# Patient Record
Sex: Female | Born: 1960 | Race: Black or African American | Hispanic: No | Marital: Married | Smoking: Former smoker
Health system: Southern US, Community
[De-identification: ages and names within clinical notes are randomized; demographics above are authoritative.]

## PROBLEM LIST (undated history)

## (undated) DIAGNOSIS — I10 Essential (primary) hypertension: Secondary | ICD-10-CM

## (undated) DIAGNOSIS — Z803 Family history of malignant neoplasm of breast: Secondary | ICD-10-CM

## (undated) DIAGNOSIS — C801 Malignant (primary) neoplasm, unspecified: Secondary | ICD-10-CM

## (undated) DIAGNOSIS — C50919 Malignant neoplasm of unspecified site of unspecified female breast: Secondary | ICD-10-CM

## (undated) HISTORY — DX: Family history of malignant neoplasm of breast: Z80.3

## (undated) HISTORY — DX: Essential (primary) hypertension: I10

## (undated) HISTORY — PX: TUBAL LIGATION: SHX77

---

## 1998-02-27 ENCOUNTER — Inpatient Hospital Stay (HOSPITAL_COMMUNITY): Admission: AD | Admit: 1998-02-27 | Discharge: 1998-02-27 | Payer: Self-pay | Admitting: Gynecology

## 1998-11-24 ENCOUNTER — Inpatient Hospital Stay (HOSPITAL_COMMUNITY): Admission: AD | Admit: 1998-11-24 | Discharge: 1998-11-24 | Payer: Self-pay | Admitting: Obstetrics and Gynecology

## 1999-01-03 ENCOUNTER — Inpatient Hospital Stay (HOSPITAL_COMMUNITY): Admission: AD | Admit: 1999-01-03 | Discharge: 1999-01-07 | Payer: Self-pay | Admitting: Obstetrics & Gynecology

## 1999-01-04 ENCOUNTER — Encounter: Payer: Self-pay | Admitting: Obstetrics & Gynecology

## 1999-01-08 ENCOUNTER — Encounter (HOSPITAL_COMMUNITY): Admission: RE | Admit: 1999-01-08 | Discharge: 1999-04-08 | Payer: Self-pay | Admitting: Obstetrics & Gynecology

## 2000-06-21 ENCOUNTER — Other Ambulatory Visit: Admission: RE | Admit: 2000-06-21 | Discharge: 2000-06-21 | Payer: Self-pay | Admitting: Obstetrics and Gynecology

## 2000-12-16 ENCOUNTER — Ambulatory Visit (HOSPITAL_COMMUNITY): Admission: RE | Admit: 2000-12-16 | Discharge: 2000-12-16 | Payer: Self-pay | Admitting: Obstetrics and Gynecology

## 2001-09-01 ENCOUNTER — Emergency Department (HOSPITAL_COMMUNITY): Admission: EM | Admit: 2001-09-01 | Discharge: 2001-09-01 | Payer: Self-pay | Admitting: Emergency Medicine

## 2002-06-13 ENCOUNTER — Other Ambulatory Visit: Admission: RE | Admit: 2002-06-13 | Discharge: 2002-06-13 | Payer: Self-pay | Admitting: Obstetrics and Gynecology

## 2002-06-13 ENCOUNTER — Other Ambulatory Visit: Admission: RE | Admit: 2002-06-13 | Discharge: 2002-06-13 | Payer: Self-pay | Admitting: *Deleted

## 2002-08-15 ENCOUNTER — Encounter: Payer: Self-pay | Admitting: Obstetrics and Gynecology

## 2002-08-15 ENCOUNTER — Ambulatory Visit (HOSPITAL_COMMUNITY): Admission: RE | Admit: 2002-08-15 | Discharge: 2002-08-15 | Payer: Self-pay | Admitting: Obstetrics and Gynecology

## 2003-01-05 ENCOUNTER — Inpatient Hospital Stay (HOSPITAL_COMMUNITY): Admission: AD | Admit: 2003-01-05 | Discharge: 2003-01-07 | Payer: Self-pay | Admitting: Obstetrics and Gynecology

## 2003-02-28 ENCOUNTER — Ambulatory Visit (HOSPITAL_COMMUNITY): Admission: RE | Admit: 2003-02-28 | Discharge: 2003-02-28 | Payer: Self-pay | Admitting: Obstetrics and Gynecology

## 2016-02-16 ENCOUNTER — Emergency Department
Admission: EM | Admit: 2016-02-16 | Discharge: 2016-02-16 | Disposition: A | Discharge status: Home / Self Care | Payer: BC Managed Care – PPO | Source: Home / Self Care | Attending: Family Medicine | Admitting: Family Medicine

## 2016-02-16 ENCOUNTER — Encounter: Payer: Self-pay | Admitting: *Deleted

## 2016-02-16 DIAGNOSIS — I10 Essential (primary) hypertension: Secondary | ICD-10-CM | POA: Diagnosis not present

## 2016-02-16 MED ORDER — HYDROCHLOROTHIAZIDE 12.5 MG PO CAPS: 12.5000 mg | ORAL_CAPSULE | Freq: Every day | ORAL | Status: DC

## 2016-02-16 NOTE — ED Notes (Addendum)
Pt c/o intermittent blurry vision x 1 week. She checked her BP @ the drug store yesterday, 190/80.No known h/o htn, family history of htn.

## 2016-02-16 NOTE — ED Provider Notes (Signed)
CSN: [id number]     Arrival date & time 02/16/16  [id number] History   First MD Initiated Contact with Patient 02/16/16 [phone]     Chief Complaint  Patient presents with  . Hypertension   (Consider location/radiation/quality/duration/timing/severity/associated sxs/prior Treatment) HPI The pt is a 55yo female presenting to [facility] with c/o elevated blood pressure and intermittent blurred vision for that last 1 week. She also reports having a frontal headache earlier this week, which is not normal for her.  She went to the pharmacy yesterday to get her BP checked, it was 190/80.  She reports a family hx of HTN with her mother and sister also having HTN.  She has not had a full physical in several years. Denies dizziness, chest pain, or SOB. Denies blurred vision or headache at this time.   History reviewed. No pertinent past medical history. Past Surgical History  Procedure Laterality Date  . Cesarean section    . Tubal ligation     Family History  Problem Relation Age of Onset  . Hypertension Mother   . Hypertension Sister    Social History  Substance Use Topics  . Smoking status: Current Every Day Smoker  . Smokeless tobacco: Never Used  . Alcohol Use: Yes   OB History    No data available     Review of Systems  Constitutional: Negative for fever and chills.  HENT: Negative for congestion, ear pain, sore throat, trouble swallowing and voice change.   Eyes: Positive for visual disturbance. Negative for photophobia and pain.  Respiratory: Negative for cough and shortness of breath.   Cardiovascular: Negative for chest pain and palpitations.  Gastrointestinal: Negative for nausea, vomiting, abdominal pain and diarrhea.  Genitourinary: Negative for dysuria, urgency and decreased urine volume.  Musculoskeletal: Negative for myalgias, back pain and arthralgias.  Neurological: Positive for headaches. Negative for dizziness and light-headedness.    Allergies  Review of patient's allergies  indicates no known allergies.  Home Medications   Prior to Admission medications   Medication Sig Start Date End Date Taking? Authorizing Provider  hydrochlorothiazide (MICROZIDE) 12.5 MG capsule Take 1 capsule (12.5 mg total) by mouth daily. 02/16/16   Noland Fordyce, PA-C   Meds Ordered and Administered this Visit  Medications - No data to display  BP 169/90 mmHg  Pulse 110  Temp(Src) 98.6 F (37 C) (Oral)  Resp 14  Ht 5' 6.5" (1.689 m)  Wt 129 lb (58.514 kg)  BMI 20.51 kg/m2  SpO2 100% No data found.   Physical Exam  Constitutional: She is oriented to person, place, and time. She appears well-developed and well-nourished. No distress.  HENT:  Head: Normocephalic and atraumatic.  Right Ear: Tympanic membrane normal.  Left Ear: Tympanic membrane normal.  Nose: Nose normal.  Mouth/Throat: Uvula is midline, oropharynx is clear and moist and mucous membranes are normal.  Eyes: Conjunctivae and EOM are normal. Pupils are equal, round, and reactive to light. No scleral icterus.  Neck: Normal range of motion. Neck supple.  Cardiovascular: Normal rate, regular rhythm and normal heart sounds.   Pulmonary/Chest: Effort normal and breath sounds normal. No respiratory distress. She has no wheezes. She has no rales. She exhibits no tenderness.  Abdominal: Soft. She exhibits no distension. There is no tenderness.  Musculoskeletal: Normal range of motion.  Neurological: She is alert and oriented to person, place, and time. She has normal strength. No cranial nerve deficit or sensory deficit. Coordination and gait normal. GCS eye subscore is 4. GCS verbal  subscore is 5. GCS motor subscore is 6.  CN II-XII in tact. Speech is clear. Able to follow 2 step commands. Normal gait.   Skin: Skin is warm and dry. She is not diaphoretic.  Nursing note and vitals reviewed.   ED Course  Procedures (including critical care time)  Labs Review Labs Reviewed - No data to display  Imaging Review No  results found.   Visual Acuity Review  Right Eye Distance: 20/30 Left Eye Distance: 20/30 Bilateral Distance: 20/30 (uncorrected)   MDM   1. Essential hypertension, benign    Hx and exam c/w HTN w/o evidence of HTN urgency or emergency at this time. Offered baseline labs to check her kidney function, however, pt declined stating she plans to schedule appointment with PCP today to establish care and understands they will likely order blood work as well.  Rx: hydrochlorothiazide 12.5mg  daily  Strongly encouraged her to f/u with PCP as soon as possible as planned.  Discussed symptoms that warrant emergent care in the ED. Patient verbalized understanding and agreement with treatment plan.   Noland Fordyce, PA-C 02/16/16 [phone]

## 2016-02-16 NOTE — Discharge Instructions (Signed)
How to Take Your Blood Pressure HOW DO I GET A BLOOD PRESSURE MACHINE?  You can buy an electronic home blood pressure machine at your local pharmacy. Insurance will sometimes cover the cost if you have a prescription.  Ask your doctor what type of machine is best for you. There are different machines for your arm and your wrist.  If you decide to buy a machine to check your blood pressure on your arm, first check the size of your arm so you can buy the right size cuff. To check the size of your arm:   Use a measuring tape that shows both inches and centimeters.   Wrap the measuring tape around the upper-middle part of your arm. You may need someone to help you measure.   Write down your arm measurement in both inches and centimeters.   To measure your blood pressure correctly, it is important to have the right size cuff.   If your arm is up to 13 inches (up to 34 centimeters), get an adult cuff size.  If your arm is 13 to 17 inches (35 to 44 centimeters), get a large adult cuff size.    If your arm is 17 to 20 inches (45 to 52 centimeters), get an adult thigh cuff.  WHAT DO THE NUMBERS MEAN?   There are two numbers that make up your blood pressure. For example: 120/80.  The first number (120 in our example) is called the "systolic pressure." It is a measure of the pressure in your blood vessels when your heart is pumping blood.  The second number (80 in our example) is called the "diastolic pressure." It is a measure of the pressure in your blood vessels when your heart is resting between beats.  Your doctor will tell you what your blood pressure should be. WHAT SHOULD I DO BEFORE I CHECK MY BLOOD PRESSURE?   Try to rest or relax for at least 30 minutes before you check your blood pressure.  Do not smoke.  Do not have any drinks with caffeine, such as:  Soda.  Coffee.  Tea.  Check your blood pressure in a quiet room.  Sit down and stretch out your arm on a table.  Keep your arm at about the level of your heart. Let your arm relax.  Make sure that your legs are not crossed. HOW DO I CHECK MY BLOOD PRESSURE?  Follow the directions that came with your machine.  Make sure you remove any tight-fitting clothing from your arm or wrist. Wrap the cuff around your upper arm or wrist. You should be able to fit a finger between the cuff and your arm. If you cannot fit a finger between the cuff and your arm, it is too tight and should be removed and rewrapped.  Some units require you to manually pump up the arm cuff.  Automatic units inflate the cuff when you press a button.  Cuff deflation is automatic in both models.  After the cuff is inflated, the unit measures your blood pressure and pulse. The readings are shown on a monitor. Hold still and breathe normally while the cuff is inflated.  Getting a reading takes less than a minute.  Some models store readings in a memory. Some provide a printout of readings. If your machine does not store your readings, keep a written record.  Take readings with you to your next visit with your doctor.   This information is not intended to replace advice given to  you by your health care provider. Make sure you discuss any questions you have with your health care provider.   Document Released: 09/01/2008 Document Revised: 10/10/2014 Document Reviewed: 11/14/2013 Elsevier Interactive Patient Education 2016 [employer].  Hypertension Hypertension, commonly called high blood pressure, is when the force of blood pumping through your arteries is too strong. Your arteries are the blood vessels that carry blood from your heart throughout your body. A blood pressure reading consists of a higher number over a lower number, such as 110/72. The higher number (systolic) is the pressure inside your arteries when your heart pumps. The lower number (diastolic) is the pressure inside your arteries when your heart relaxes. Ideally you want  your blood pressure below 120/80. Hypertension forces your heart to work harder to pump blood. Your arteries may become narrow or stiff. Having untreated or uncontrolled hypertension can cause heart attack, stroke, kidney disease, and other problems. RISK FACTORS Some risk factors for high blood pressure are controllable. Others are not.  Risk factors you cannot control include:   Race. You may be at higher risk if you are African American.  Age. Risk increases with age.  Gender. Men are at higher risk than women before age 27 years. After age 103, women are at higher risk than men. Risk factors you can control include:  Not getting enough exercise or physical activity.  Being overweight.  Getting too much fat, sugar, calories, or salt in your diet.  Drinking too much alcohol. SIGNS AND SYMPTOMS Hypertension does not usually cause signs or symptoms. Extremely high blood pressure (hypertensive crisis) may cause headache, anxiety, shortness of breath, and nosebleed. DIAGNOSIS To check if you have hypertension, your health care provider will measure your blood pressure while you are seated, with your arm held at the level of your heart. It should be measured at least twice using the same arm. Certain conditions can cause a difference in blood pressure between your right and left arms. A blood pressure reading that is higher than normal on one occasion does not mean that you need treatment. If it is not clear whether you have high blood pressure, you may be asked to return on a different day to have your blood pressure checked again. Or, you may be asked to monitor your blood pressure at home for 1 or more weeks. TREATMENT Treating high blood pressure includes making lifestyle changes and possibly taking medicine. Living a healthy lifestyle can help lower high blood pressure. You may need to change some of your habits. Lifestyle changes may include:  Following the DASH diet. This diet is high  in fruits, vegetables, and whole grains. It is low in salt, red meat, and added sugars.  Keep your sodium intake below 2,300 mg per day.  Getting at least 30-45 minutes of aerobic exercise at least 4 times per week.  Losing weight if necessary.  Not smoking.  Limiting alcoholic beverages.  Learning ways to reduce stress. Your health care provider may prescribe medicine if lifestyle changes are not enough to get your blood pressure under control, and if one of the following is true:  You are 52-8 years of age and your systolic blood pressure is above 140.  You are 21 years of age or older, and your systolic blood pressure is above 150.  Your diastolic blood pressure is above 90.  You have diabetes, and your systolic blood pressure is over [id number] or your diastolic blood pressure is over 90.  You have kidney disease  and your blood pressure is above 140/90.  You have heart disease and your blood pressure is above 140/90. Your personal target blood pressure may vary depending on your medical conditions, your age, and other factors. HOME CARE INSTRUCTIONS  Have your blood pressure rechecked as directed by your health care provider.   Take medicines only as directed by your health care provider. Follow the directions carefully. Blood pressure medicines must be taken as prescribed. The medicine does not work as well when you skip doses. Skipping doses also puts you at risk for problems.  Do not smoke.   Monitor your blood pressure at home as directed by your health care provider. SEEK MEDICAL CARE IF:   You think you are having a reaction to medicines taken.  You have recurrent headaches or feel dizzy.  You have swelling in your ankles.  You have trouble with your vision. SEEK IMMEDIATE MEDICAL CARE IF:  You develop a severe headache or confusion.  You have unusual weakness, numbness, or feel faint.  You have severe chest or abdominal pain.  You vomit repeatedly.  You  have trouble breathing. MAKE SURE YOU:   Understand these instructions.  Will watch your condition.  Will get help right away if you are not doing well or get worse.   This information is not intended to replace advice given to you by your health care provider. Make sure you discuss any questions you have with your health care provider.   Document Released: 09/19/2005 Document Revised: 02/03/2015 Document Reviewed: 07/12/2013 Elsevier Interactive Patient Education 2016 [place] DASH stands for "Dietary Approaches to Stop Hypertension." The DASH eating plan is a healthy eating plan that has been shown to reduce high blood pressure (hypertension). Additional health benefits may include reducing the risk of type 2 diabetes mellitus, heart disease, and stroke. The DASH eating plan may also help with weight loss. WHAT DO I NEED TO KNOW ABOUT THE DASH EATING PLAN? For the DASH eating plan, you will follow these general guidelines:  Choose foods with a percent daily value for sodium of less than 5% (as listed on the food label).  Use salt-free seasonings or herbs instead of table salt or sea salt.  Check with your health care provider or pharmacist before using salt substitutes.  Eat lower-sodium products, often labeled as "lower sodium" or "no salt added."  Eat fresh foods.  Eat more vegetables, fruits, and low-fat dairy products.  Choose whole grains. Look for the word "whole" as the first word in the ingredient list.  Choose fish and skinless chicken or [place] more often than red meat. Limit fish, poultry, and meat to 6 oz (170 g) each day.  Limit sweets, desserts, sugars, and sugary drinks.  Choose heart-healthy fats.  Limit cheese to 1 oz (28 g) per day.  Eat more home-cooked food and less restaurant, buffet, and fast food.  Limit fried foods.  Cook foods using methods other than frying.  Limit canned vegetables. If you do use them, rinse them well  to decrease the sodium.  When eating at a restaurant, ask that your food be prepared with less salt, or no salt if possible. WHAT FOODS CAN I EAT? Seek help from a dietitian for individual calorie needs. Grains Whole grain or whole wheat bread. Brown rice. Whole grain or whole wheat pasta. Quinoa, bulgur, and whole grain cereals. Low-sodium cereals. Corn or whole wheat flour tortillas. Whole grain cornbread. Whole grain crackers. Low-sodium crackers. Vegetables Fresh  or frozen vegetables (raw, steamed, roasted, or grilled). Low-sodium or reduced-sodium tomato and vegetable juices. Low-sodium or reduced-sodium tomato sauce and paste. Low-sodium or reduced-sodium canned vegetables.  Fruits All fresh, canned (in natural juice), or frozen fruits. Meat and Other Protein Products Ground beef (85% or leaner), grass-fed beef, or beef trimmed of fat. Skinless chicken or [place]. Ground chicken or [place]. Pork trimmed of fat. All fish and seafood. Eggs. Dried beans, peas, or lentils. Unsalted nuts and seeds. Unsalted canned beans. Dairy Low-fat dairy products, such as skim or 1% milk, 2% or reduced-fat cheeses, low-fat ricotta or cottage cheese, or plain low-fat yogurt. Low-sodium or reduced-sodium cheeses. Fats and Oils Tub margarines without trans fats. Light or reduced-fat mayonnaise and salad dressings (reduced sodium). Avocado. Safflower, olive, or canola oils. Natural peanut or almond butter. Other Unsalted popcorn and pretzels. The items listed above may not be a complete list of recommended foods or beverages. Contact your dietitian for more options. WHAT FOODS ARE NOT RECOMMENDED? Grains White bread. White pasta. White rice. Refined cornbread. Bagels and croissants. Crackers that contain trans fat. Vegetables Creamed or fried vegetables. Vegetables in a cheese sauce. Regular canned vegetables. Regular canned tomato sauce and paste. Regular tomato and vegetable juices. Fruits Dried fruits.  Canned fruit in light or heavy syrup. Fruit juice. Meat and Other Protein Products Fatty cuts of meat. Ribs, chicken wings, bacon, sausage, bologna, salami, chitterlings, fatback, hot dogs, bratwurst, and packaged luncheon meats. Salted nuts and seeds. Canned beans with salt. Dairy Whole or 2% milk, cream, half-and-half, and cream cheese. Whole-fat or sweetened yogurt. Full-fat cheeses or blue cheese. Nondairy creamers and whipped toppings. Processed cheese, cheese spreads, or cheese curds. Condiments Onion and garlic salt, seasoned salt, table salt, and sea salt. Canned and packaged gravies. Worcestershire sauce. Tartar sauce. Barbecue sauce. Teriyaki sauce. Soy sauce, including reduced sodium. Steak sauce. Fish sauce. Oyster sauce. Cocktail sauce. Horseradish. Ketchup and mustard. Meat flavorings and tenderizers. Bouillon cubes. Hot sauce. Tabasco sauce. Marinades. Taco seasonings. Relishes. Fats and Oils Butter, stick margarine, lard, shortening, ghee, and bacon fat. Coconut, palm kernel, or palm oils. Regular salad dressings. Other Pickles and olives. Salted popcorn and pretzels. The items listed above may not be a complete list of foods and beverages to avoid. Contact your dietitian for more information. WHERE CAN I FIND MORE INFORMATION? National Heart, Lung, and Blood Institute: travelstabloid.com   This information is not intended to replace advice given to you by your health care provider. Make sure you discuss any questions you have with your health care provider.   Document Released: 09/08/2011 Document Revised: 10/10/2014 Document Reviewed: 07/24/2013 Elsevier Interactive Patient Education [employer].

## 2016-02-18 ENCOUNTER — Telehealth: Payer: Self-pay | Admitting: *Deleted

## 2016-02-18 NOTE — ED Notes (Signed)
Callback: Pt reports she has felt a little better since starting anti-hypertensive, vision is improving. She has apt to establish care with Dr. Ileene Rubens.

## 2016-03-09 ENCOUNTER — Ambulatory Visit: Payer: BC Managed Care – PPO | Admitting: Family Medicine

## 2016-03-23 ENCOUNTER — Encounter: Payer: Self-pay | Admitting: Osteopathic Medicine

## 2016-03-23 ENCOUNTER — Ambulatory Visit (INDEPENDENT_AMBULATORY_CARE_PROVIDER_SITE_OTHER): Payer: BC Managed Care – PPO | Admitting: Osteopathic Medicine

## 2016-03-23 VITALS — BP 149/80 | HR 100 | Ht 67.0 in | Wt 128.0 lb

## 2016-03-23 DIAGNOSIS — I10 Essential (primary) hypertension: Secondary | ICD-10-CM

## 2016-03-23 MED ORDER — HYDROCHLOROTHIAZIDE 25 MG PO TABS: 12.5000 mg | ORAL_TABLET | Freq: Every day | ORAL | Status: DC

## 2016-03-23 NOTE — Progress Notes (Signed)
HPI: Heather [name] is a 55 y.o. female who presents to [facility] today for chief complaint of:  Chief Complaint  Patient presents with  . Establish Care    blood pressure    HTN . Context: checked BP at a drugstore and was [id number] systolic so went to urgent care  . Modifying factors: took meds this morning as usual  . Assoc signs/symptoms: headache and blurred vision.    Reviewed UC notes 02/16/16 - high BP and HA x1 week, labs declined, Hctz 12.5 initiated - she is taking that medicine as above. Feeling well today     Past medical, social and family history reviewed: History reviewed. No pertinent past medical history. Past Surgical History  Procedure Laterality Date  . Cesarean section    . Tubal ligation     Social History  Substance Use Topics  . Smoking status: Current Every Day Smoker  . Smokeless tobacco: Never Used  . Alcohol Use: Yes   Family History  Problem Relation Age of Onset  . Hypertension Mother   . Diabetes Mother   . Hypertension Sister     Current Outpatient Prescriptions  Medication Sig Dispense Refill  . hydrochlorothiazide (MICROZIDE) 12.5 MG capsule Take 1 capsule (12.5 mg total) by mouth daily. 30 capsule 0   No current facility-administered medications for this visit.   No Known Allergies    Review of Systems: CONSTITUTIONAL:  No  fever, no chills, No recent illness, No unintentional weight changes HEAD/EYES/EARS/NOSE/THROAT: (+)occasional headache, no vision change, No sore throat, No  sinus pressure CARDIAC: No  chest pain, No  pressure, No palpitations, No  orthopnea RESPIRATORY: No  cough, No  shortness of breath/wheeze GASTROINTESTINAL: No  nausea, No  vomiting, No  abdominal pain, No  blood in stool, No  diarrhea, No  constipation  MUSCULOSKELETAL: No  myalgia/arthralgia GENITOURINARY: No  incontinence, No  abnormal genital bleeding/discharge SKIN: No  rash/wounds/concerning lesions HEM/ONC:  No  easy bruising/bleeding, No  abnormal lymph node ENDOCRINE: No polyuria/polydipsia/polyphagia, No  heat/cold intolerance  NEUROLOGIC: No  weakness, No  dizziness, No  slurred speech PSYCHIATRIC: No  concerns with depression, No  concerns with anxiety, No sleep problems  Exam:  BP 149/80 mmHg  Pulse 100  Ht 5\' 7"  (1.702 m)  Wt 128 lb (58.06 kg)  BMI 20.04 kg/m2 Constitutional: VS see above. General Appearance: alert, well-developed, well-nourished, NAD Eyes: Normal lids and conjunctive, non-icteric sclera, PERRLA, EOMI Ears, Nose, Mouth, Throat: MMM, Normal external inspection ears/nares/mouth/lips/gums, TM normal bilaterally. Pharynx/tonsils no erythema, no exudate. Nasal mucosa normal.  Neck: No masses, trachea midline. No thyroid enlargement. No tenderness/mass appreciated. No lymphadenopathy Respiratory: Normal respiratory effort. no wheeze, no rhonchi, no rales Cardiovascular: S1/S2 normal, no murmur, no rub/gallop auscultated. RRR. No lower extremity edema. Gastrointestinal: Nontender, no masses. No hepatomegaly, no splenomegaly. No hernia appreciated. Bowel sounds normal. Rectal exam deferred.  Musculoskeletal: Gait normal. No clubbing/cyanosis of digits.  Skin: warm, dry, intact. No rash/ulcer.  Psychiatric: Normal judgment/insight. Normal mood and affect. Oriented x3.     ASSESSMENT/PLAN: Reviewed needed screening for HTN patients - labs as below. Increase HCT, consider ACE if no better or if indications such as diabetes on labs. Fasting labs prior to next visit - need to find out if will need Pap at that visit otherwise treat as annual wellness with hopefully easy BP recheck.   Essential hypertension - Plan: CBC with Differential/Platelet, COMPLETE METABOLIC PANEL WITH GFR, TSH, Lipid panel, hydrochlorothiazide (HYDRODIURIL)  25 MG tablet   All questions were answered. Visit summary with medication list and pertinent instructions was printed for patient to review. ER/RTC  precautions were reviewed with the patient. Return in about 2 weeks (around 04/06/2016) for [place] / POSSIBLE PAP .

## 2016-03-23 NOTE — Patient Instructions (Addendum)
Let's plan to follow-up here in the office in 2 weeks for BLOOD PRESSURE RECHECK and [employer]. Please have your most recent pap smear report sent to our office so we can make sure you are up-to-date on cervical cancer screening.   As long as there are no other concerns and your blood pressure is controlled, we can plan to follow-up every 6 months for management/monitoring of blood pressure. Please don't hesitate to make an appointment sooner if you're having any acute concerns or problems!  Please let your pharmacy know when you are running low on medications/refills (do not wait until you are out of medicines). Your pharmacy will send our office a request for the appropriate medications. Please allow our office 2-3 business days to process the needed refills.   At any visits to any specialists, or if you receive vaccines anywhere other than this office, please give them our clinic information so that they can forward [place] any records, including any tests which are done or changes to your medications. This allows all your physicians to communicate effectively, putting your primary care doctor at the center of your medical care and allowing [place] to effectively coordinate your care.   Please let our office know if you are ever in the emergency room or hospitalized - we encourage patients to follow-up with [place] any time they are discharged from the ER or hospital for a serious illness or injury.   Please let us know if there is anything else we can do for you. Take care! -Dr. Loni Muse.

## 2016-03-29 LAB — CBC WITH DIFFERENTIAL/PLATELET
Basophils Absolute: 0 cells/uL (ref 0–200)
Basophils Relative: 0 %
Eosinophils Absolute: 65 cells/uL (ref 15–500)
Eosinophils Relative: 1 %
HCT: 40.9 % (ref 35.0–45.0)
Hemoglobin: 13 g/dL (ref 11.7–15.5)
Lymphocytes Relative: 30 %
Lymphs Abs: 1950 cells/uL (ref 850–3900)
MCH: 26.3 pg — ABNORMAL LOW (ref 27.0–33.0)
MCHC: 31.8 g/dL — ABNORMAL LOW (ref 32.0–36.0)
MCV: 82.8 fL (ref 80.0–100.0)
MPV: 9.2 fL (ref 7.5–12.5)
Monocytes Absolute: 455 cells/uL (ref 200–950)
Monocytes Relative: 7 %
Neutro Abs: 4030 cells/uL (ref 1500–7800)
Neutrophils Relative %: 62 %
Platelets: 295 10*3/uL (ref 140–400)
RBC: 4.94 MIL/uL (ref 3.80–5.10)
RDW: 13.8 % (ref 11.0–15.0)
WBC: 6.5 10*3/uL (ref 3.8–10.8)

## 2016-03-30 LAB — COMPLETE METABOLIC PANEL WITH GFR
ALT: 9 U/L (ref 6–29)
AST: 23 U/L (ref 10–35)
Albumin: 4.3 g/dL (ref 3.6–5.1)
Alkaline Phosphatase: 68 U/L (ref 33–130)
BUN: 16 mg/dL (ref 7–25)
CO2: 30 mmol/L (ref 20–31)
Calcium: 9.5 mg/dL (ref 8.6–10.4)
Chloride: 103 mmol/L (ref 98–110)
Creat: 0.84 mg/dL (ref 0.50–1.05)
GFR, Est African American: >89 mL/min (ref >=60)
GFR, Est Non African American: 78 mL/min (ref >=60)
Glucose, Bld: 95 mg/dL (ref 65–99)
Potassium: 4.1 mmol/L (ref 3.5–5.3)
Sodium: 140 mmol/L (ref 135–146)
Total Bilirubin: 0.6 mg/dL (ref 0.2–1.2)
Total Protein: 6.4 g/dL (ref 6.1–8.1)

## 2016-03-30 LAB — LIPID PANEL
Cholesterol: 184 mg/dL (ref 125–200)
HDL: 51 mg/dL (ref >=46)
LDL Cholesterol: 118 mg/dL (ref <130)
Total CHOL/HDL Ratio: 3.6 Ratio (ref <=5.0)
Triglycerides: 76 mg/dL (ref <150)
VLDL: 15 mg/dL (ref <30)

## 2016-03-30 LAB — TSH: TSH: 1.84 mIU/L

## 2016-04-06 ENCOUNTER — Ambulatory Visit (INDEPENDENT_AMBULATORY_CARE_PROVIDER_SITE_OTHER): Payer: BC Managed Care – PPO | Admitting: Osteopathic Medicine

## 2016-04-06 DIAGNOSIS — Z91199 Patient's noncompliance with other medical treatment and regimen due to unspecified reason: Secondary | ICD-10-CM | POA: Insufficient documentation

## 2016-04-06 DIAGNOSIS — Z5329 Procedure and treatment not carried out because of patient's decision for other reasons: Secondary | ICD-10-CM

## 2016-04-06 NOTE — Progress Notes (Signed)
NO SHOW 04/06/2016 for CPE w/ Dr Sheppard Coil

## 2016-04-13 ENCOUNTER — Other Ambulatory Visit (HOSPITAL_COMMUNITY)
Admission: RE | Admit: 2016-04-13 | Discharge: 2016-04-13 | Disposition: A | Discharge status: Home / Self Care | Payer: BC Managed Care – PPO | Source: Ambulatory Visit | Attending: Osteopathic Medicine | Admitting: Osteopathic Medicine

## 2016-04-13 ENCOUNTER — Ambulatory Visit (INDEPENDENT_AMBULATORY_CARE_PROVIDER_SITE_OTHER): Payer: BC Managed Care – PPO | Admitting: Osteopathic Medicine

## 2016-04-13 ENCOUNTER — Encounter: Payer: Self-pay | Admitting: Osteopathic Medicine

## 2016-04-13 VITALS — BP 151/89 | HR 90 | Ht 67.0 in | Wt 129.0 lb

## 2016-04-13 DIAGNOSIS — Z79899 Other long term (current) drug therapy: Secondary | ICD-10-CM

## 2016-04-13 DIAGNOSIS — Z1151 Encounter for screening for human papillomavirus (HPV): Secondary | ICD-10-CM | POA: Diagnosis present

## 2016-04-13 DIAGNOSIS — Z23 Encounter for immunization: Secondary | ICD-10-CM | POA: Diagnosis not present

## 2016-04-13 DIAGNOSIS — Z716 Tobacco abuse counseling: Secondary | ICD-10-CM

## 2016-04-13 DIAGNOSIS — Z7189 Other specified counseling: Secondary | ICD-10-CM | POA: Insufficient documentation

## 2016-04-13 DIAGNOSIS — Z01419 Encounter for gynecological examination (general) (routine) without abnormal findings: Secondary | ICD-10-CM | POA: Diagnosis present

## 2016-04-13 DIAGNOSIS — Z0000 Encounter for general adult medical examination without abnormal findings: Secondary | ICD-10-CM | POA: Diagnosis not present

## 2016-04-13 DIAGNOSIS — I10 Essential (primary) hypertension: Secondary | ICD-10-CM | POA: Diagnosis not present

## 2016-04-13 MED ORDER — HYDROCHLOROTHIAZIDE 25 MG PO TABS: 25.0000 mg | ORAL_TABLET | Freq: Every day | ORAL | Status: DC

## 2016-04-13 NOTE — Patient Instructions (Addendum)
Take whole pill of Hydrochlorothiazide and return to clinic in 2 - 3 weeks for nurse visit blood pressure check, can get labs done downstairs at that visit, too.   Let us know if you'd like [place] to order the Cologuard test for colon cancer screening.   Recommend flu vaccination this fall.   We encourage you to quit smoking! If you'd like help with this, let us know!

## 2016-04-13 NOTE — Progress Notes (Signed)
HPI: Heather [name] is a 55 y.o. Not Hispanic or Latino female  who presents to [facility] today, 04/13/2016,  for chief complaint of:  Chief Complaint  Patient presents with  . Annual Exam      See below for preventive care. No other issues/complaints today.    HTN: not taking consistently, has to cut it in half. Not remembering to take every day. No CP/SOB, no HA/VC  Past medical, surgical, social and family history reviewed: Past Medical History  Diagnosis Date  . Hypertension    Past Surgical History  Procedure Laterality Date  . Tubal ligation    . Cesarean section     Social History  Substance Use Topics  . Smoking status: Current Every Day Smoker  . Smokeless tobacco: Never Used  . Alcohol Use: Yes   Family History  Problem Relation Age of Onset  . Hypertension Mother   . Diabetes Mother   . Hypertension Sister      Current medication list and allergy/intolerance information reviewed:   Current Outpatient Prescriptions  Medication Sig Dispense Refill  . hydrochlorothiazide (HYDRODIURIL) 25 MG tablet Take 0.5 tablets (12.5 mg total) by mouth daily. 30 tablet 1   No current facility-administered medications for this visit.   No Known Allergies    Review of Systems:  Constitutional:  No  fever, no chills, No recent illness  HEENT: No  headache, no vision change  Cardiac: No  chest pain, No  pressure  Respiratory:  No  shortness of breath  Gastrointestinal: No  abdominal pain, No  nausea, No  vomiting,  No  blood in stool, No  diarrhea, No  constipation   Musculoskeletal: No new myalgia/arthralgia  Skin: No  Rash  Hem/Onc: No  easy bruising/bleeding, No  abnormal lymph node  Neurologic: No  weakness, No  dizziness  Psychiatric: No  concerns with depression, No  concerns with anxiety, No sleep problems, No mood problems  Exam:  BP 151/89 mmHg  Pulse 90  Ht 5' 7"  (1.702 m)  Wt 129 lb (58.514 kg)   BMI 20.20 kg/m2  Constitutional: VS see above. General Appearance: alert, well-developed, well-nourished, NAD  Eyes: Normal lids and conjunctive, non-icteric sclera  Ears, Nose, Mouth, Throat: MMM, Normal external inspection ears/nares/mouth/lips/gum.   Neck: No masses, trachea midline. No thyroid enlargement. No tenderness/mass appreciated. No lymphadenopathy  Respiratory: Normal respiratory effort.   Cardiovascular: S1/S2 normal, no murmur, no rub/gallop auscultated. RRR. No lower extremity edema.   Gastrointestinal: Nontender, no masses.  Musculoskeletal: Gait normal. No clubbing/cyanosis of digits.   Neurological: No cranial nerve deficit on limited exam.  Skin: warm, dry, intact. No rash/ulcer. No concerning nevi or subq nodules on limited exam.    Psychiatric: Normal judgment/insight. Normal mood and affect. Oriented x3.  GYN: No lesions/ulcers to external genitalia, normal urethra, normal vaginal mucosa, physiologic discharge, cervix normal without lesions, uterus not enlarged or tender, adnexa no masses and nontender BREAST: No rashes/skin changes, normal fibrous breast tissue, no masses or tenderness, normal nipple without discharge, normal axilla    Recent Results (from the past 2160 hour(s))  CBC with Differential/Platelet     Status: Abnormal   Collection Time: 03/29/16  8:20 AM  Result Value Ref Range   WBC 6.5 3.8 - 10.8 K/uL   RBC 4.94 3.80 - 5.10 MIL/uL   Hemoglobin 13.0 11.7 - 15.5 g/dL   HCT 40.9 35.0 - 45.0 %   MCV 82.8 80.0 - 100.0 fL  MCH 26.3 (L) 27.0 - 33.0 pg   MCHC 31.8 (L) 32.0 - 36.0 g/dL   RDW 13.8 11.0 - 15.0 %   Platelets 295 140 - 400 K/uL   MPV 9.2 7.5 - 12.5 fL   Neutro Abs 4030 1500 - 7800 cells/uL   Lymphs Abs 1950 850 - 3900 cells/uL   Monocytes Absolute 455 200 - 950 cells/uL   Eosinophils Absolute 65 15 - 500 cells/uL   Basophils Absolute 0 0 - 200 cells/uL   Neutrophils Relative % 62 %   Lymphocytes Relative 30 %   Monocytes  Relative 7 %   Eosinophils Relative 1 %   Basophils Relative 0 %   Smear Review Criteria for review not met     Comment: ** Please note change in unit of measure and reference range(s). **  COMPLETE METABOLIC PANEL WITH GFR     Status: None   Collection Time: 03/29/16  8:20 AM  Result Value Ref Range   Sodium 140 135 - 146 mmol/L   Potassium 4.1 3.5 - 5.3 mmol/L   Chloride 103 98 - 110 mmol/L   CO2 30 20 - 31 mmol/L   Glucose, Bld 95 65 - 99 mg/dL   BUN 16 7 - 25 mg/dL   Creat 0.84 0.50 - 1.05 mg/dL    Comment:   For patients > or = 56 years of age: The upper reference limit for Creatinine is approximately 13% higher for people identified as African-American.      Total Bilirubin 0.6 0.2 - 1.2 mg/dL   Alkaline Phosphatase 68 33 - 130 U/L   AST 23 10 - 35 U/L   ALT 9 6 - 29 U/L   Total Protein 6.4 6.1 - 8.1 g/dL   Albumin 4.3 3.6 - 5.1 g/dL   Calcium 9.5 8.6 - 10.4 mg/dL   GFR, Est African American >89 >=60 mL/min   GFR, Est Non African American 78 >=60 mL/min  TSH     Status: None   Collection Time: 03/29/16  8:20 AM  Result Value Ref Range   TSH 1.84 mIU/L    Comment:   Reference Range   > or = 20 Years  0.40-4.50   Pregnancy Range First trimester  0.26-2.66 Second trimester 0.55-2.73 Third trimester  0.43-2.91     Lipid panel     Status: None   Collection Time: 03/29/16  8:20 AM  Result Value Ref Range   Cholesterol 184 125 - 200 mg/dL   Triglycerides 76 <150 mg/dL   HDL 51 >=46 mg/dL   Total CHOL/HDL Ratio 3.6 <=5.0 Ratio   VLDL 15 <30 mg/dL   LDL Cholesterol 118 <130 mg/dL    Comment:   Total Cholesterol/HDL Ratio:CHD Risk                        Coronary Heart Disease Risk Table                                        Men       Women          1/2 Average Risk              3.4        3.3              Average Risk  5.0        4.4           2X Average Risk              9.6        7.1           3X Average Risk             23.4       11.0 Use  the calculated Patient Ratio above and the CHD Risk table  to determine the patient's CHD Risk.      ASSESSMENT/PLAN:   Annual physical exam - Cardiac risk counseling reviewed. Pap today. Mammo ordered. Tetanus today. Pt will think about Cologuard/Colonoscopy.  - Plan: Tdap vaccine greater than or equal to 7yo IM, MM DIGITAL SCREENING BILATERAL, Cytology - PAP  Cardiac risk counseling - ASCVD 10-year risk w/ current modifiable risk factors (smoking, uncontrolled BP, borderline LDL/TC) = 16.0%. Need quit smoking, control BP!   Medication management - Plan: BASIC METABOLIC PANEL WITH GFR  Essential hypertension - increase HCT - better control and not having to cut meds will hopefully improve compliance, also. RTC nurse visit BP check. If BP ok, RTC 6 mos.  - Plan: BASIC METABOLIC PANEL WITH GFR, hydrochlorothiazide (HYDRODIURIL) 25 MG tablet  Tobacco abuse counseling       Visit summary with medication list and pertinent instructions was printed for patient to review. All questions at time of visit were answered - patient instructed to contact office with any additional concerns. ER/RTC precautions were reviewed with the patient. Follow-up plan: Return in about 3 weeks (around 05/04/2016), or sooner if needed, for NURSE VISIT - BLOOD PRESSURE CHECK, DOWNSTAIRS FOR LABS (NO NEED TO BE FASTING) .

## 2016-04-14 LAB — CYTOLOGY - PAP

## 2016-05-06 ENCOUNTER — Telehealth: Payer: Self-pay | Admitting: *Deleted

## 2016-05-06 ENCOUNTER — Ambulatory Visit (INDEPENDENT_AMBULATORY_CARE_PROVIDER_SITE_OTHER): Payer: BC Managed Care – PPO | Admitting: Osteopathic Medicine

## 2016-05-06 VITALS — BP 149/89 | HR 90 | Wt 130.0 lb

## 2016-05-06 DIAGNOSIS — I10 Essential (primary) hypertension: Secondary | ICD-10-CM

## 2016-05-06 NOTE — Progress Notes (Signed)
Vitals:   05/06/16 0844  BP: (!) 149/89  Pulse: 90   I only see this blood pressure reading to 149/89, with this blood pressure rechecked prior to patient leaving, or was this the rechecked measurement?

## 2016-05-06 NOTE — Telephone Encounter (Signed)
Ok! [employer] send new Rx later today once I'm at my computer. Thanks for letting me know!

## 2016-05-06 NOTE — Telephone Encounter (Signed)
This was the initial and only check after patient had been sitting for a while

## 2016-05-06 NOTE — Progress Notes (Signed)
Patient is here for BP check. She is taking her BP medication as directed. Denies headache dizziness, fatigue.

## 2016-05-09 MED ORDER — LISINOPRIL 10 MG PO TABS: 10.0000 mg | ORAL_TABLET | Freq: Every day | ORAL | 1 refills | Status: DC

## 2016-05-10 NOTE — Progress Notes (Signed)
Th patient is aware of to make a f/u appointment in 2 weeks and to pick up blood pressure med

## 2016-07-22 ENCOUNTER — Other Ambulatory Visit: Payer: Self-pay | Admitting: *Deleted

## 2016-07-22 MED ORDER — LISINOPRIL 10 MG PO TABS: 10.0000 mg | ORAL_TABLET | Freq: Every day | ORAL | 0 refills | Status: DC

## 2016-07-29 ENCOUNTER — Other Ambulatory Visit: Payer: Self-pay | Admitting: Osteopathic Medicine

## 2016-07-29 DIAGNOSIS — I10 Essential (primary) hypertension: Secondary | ICD-10-CM

## 2016-09-29 ENCOUNTER — Other Ambulatory Visit: Payer: Self-pay | Admitting: Osteopathic Medicine

## 2017-01-27 ENCOUNTER — Ambulatory Visit (INDEPENDENT_AMBULATORY_CARE_PROVIDER_SITE_OTHER): Payer: BC Managed Care – PPO | Admitting: Osteopathic Medicine

## 2017-01-27 ENCOUNTER — Encounter: Payer: Self-pay | Admitting: Osteopathic Medicine

## 2017-01-27 VITALS — BP 162/74 | HR 101 | Resp 16 | Wt 123.4 lb

## 2017-01-27 DIAGNOSIS — H938X2 Other specified disorders of left ear: Secondary | ICD-10-CM

## 2017-01-27 DIAGNOSIS — G4489 Other headache syndrome: Secondary | ICD-10-CM

## 2017-01-27 DIAGNOSIS — I10 Essential (primary) hypertension: Secondary | ICD-10-CM

## 2017-01-27 DIAGNOSIS — Z1211 Encounter for screening for malignant neoplasm of colon: Secondary | ICD-10-CM | POA: Diagnosis not present

## 2017-01-27 MED ORDER — LISINOPRIL-HYDROCHLOROTHIAZIDE 10-12.5 MG PO TABS: 1.0000 | ORAL_TABLET | Freq: Every day | ORAL | 2 refills | Status: DC

## 2017-01-27 NOTE — Patient Instructions (Addendum)
Plan:  1. Start taking the new pill which is a combination of lisinopril 10 mg plus hydrochlorothiazide 12.5 mg. You can discard your old prescriptions.  2. Plan to follow-up in 2 weeks with a nurse visit for a blood pressure check, if blood pressure is looking better, we will plan to continue this medication  3. Plan to follow up in 3 months for your annual physical. If you would like to get blood work done ahead of that visit, please come to the lab at least 2-3 days prior to your appointment. Please come fasting, meaning no food or anything to drink other than water for at least 8 hours. You can take your medications as usual.  4. If your headaches persist despite blood pressure reaching goal, would recommend following up with your eye doctor for an eye exam.

## 2017-01-27 NOTE — Progress Notes (Signed)
HPI: Heather [name] is a 56 y.o. female  who presents to [facility] today, 01/27/17,  for chief complaint of:  Chief Complaint  Patient presents with  . Hypertension    HTN: not taking medications as prescribed - Was never taking the lisinopril plus the hydrochlorothiazide, was unclear on the directions so she has been alternating back and forth. Has been out of the lisinopril for about a month.   Associated symptom of frontal pressure type headaches with some blurred vision when looking at screens for long periods of time. No chest pain, shortness of breath, dizziness.  Earache: Reports ringing-type sound and fullness in left ear. No injury/known infection. No sinus pressure.    Past medical history, surgical history, social history and family history reviewed.  Patient Active Problem List   Diagnosis Date Noted  . Annual physical exam 04/13/2016  . Cardiac risk counseling 04/13/2016  . Failure to attend appointment 04/06/2016  . Essential hypertension 03/23/2016    Current medication list and allergy/intolerance information reviewed.   Current Outpatient Prescriptions on File Prior to Visit  Medication Sig Dispense Refill  . hydrochlorothiazide (HYDRODIURIL) 25 MG tablet Take 0.5 tablets (12.5 mg total) by mouth daily. APPOINTMENT NEEDED FOR FURTHER REFILLS 30 tablet 0  . lisinopril (PRINIVIL,ZESTRIL) 10 MG tablet TAKE 1 TABLET BY MOUTH DAILY. TAKE WITH HYDROCHLOROTHIAZIDE. PLEASE SCHEDULE A F/U APPT (Patient not taking: Reported on 01/27/2017) 15 tablet 0   No current facility-administered medications on file prior to visit.    No Known Allergies    Review of Systems:  Constitutional: No recent illness  HEENT: +headache, no vision change, +ear complaint as per HPI  Cardiac: No  chest pain, No  pressure, No palpitations  Respiratory:  No  shortness of breath. No  Cough  Neurologic: No  weakness, No  Dizziness   Exam:  BP  (!) 162/74   Pulse (!) 101   Resp 16   Wt 123 lb 6.4 oz (56 kg)   BMI 19.33 kg/m   Constitutional: VS see above. General Appearance: alert, well-developed, well-nourished, NAD  Eyes: Normal lids and conjunctive, non-icteric sclera  Ears, Nose, Mouth, Throat: MMM, Normal external inspection ears/nares/mouth/lips/gums. Tympanic membrane on right normal. Tympanic membranes on left not visible due to cerumen/debris. Irrigation performed by LPN. TM visible and appears normal - possible clear effusion behind TM or il from irrigation adhred to membrane  Neck: No masses, trachea midline.   Respiratory: Normal respiratory effort. no wheeze, no rhonchi, no rales  Cardiovascular: S1/S2 normal, no murmur, no rub/gallop auscultated. RRR.   Musculoskeletal: Gait normal. Symmetric and independent movement of all extremities  Neurological: Normal balance/coordination. No tremor.  Skin: warm, dry, intact.   Psychiatric: Normal judgment/insight. Normal mood and affect. Oriented x3.      ASSESSMENT/PLAN:   Essential hypertension - Labs ordered to get done prior to next annual physical. Combo pill to reduce confusion & pill burden - Plan: CBC with Differential/Platelet, COMPLETE METABOLIC PANEL WITH GFR, Lipid panel, TSH, VITAMIN D 25 Hydroxy (Vit-D Deficiency, Fractures)  Ear fullness, left - improved with irrigration   Other headache syndrome - Likely due to uncontrolled hypertension, recommend eye exam if no improvement.  Screening for colon cancer - Plan: Ambulatory referral to Gastroenterology    Patient Instructions  Plan:  1. Start taking the new pill which is a combination of lisinopril 10 mg plus hydrochlorothiazide 12.5 mg. You can discard your old prescriptions.  2. Plan to follow-up in  2 weeks with a nurse visit for a blood pressure check, if blood pressure is looking better, we will plan to continue this medication  3. Plan to follow up in 3 months for your annual physical.  If you would like to get blood work done ahead of that visit, please come to the lab at least 2-3 days prior to your appointment. Please come fasting, meaning no food or anything to drink other than water for at least 8 hours. You can take your medications as usual.  4. If your headaches persist despite blood pressure reaching goal, would recommend following up with your eye doctor for an eye exam.    Follow-up plan: Return for nurse visit in 2 weeks for BP recheck, 3 months for annual physical .  Visit summary with medication list and pertinent instructions was printed for patient to review, alert [place] if any changes needed. All questions at time of visit were answered - patient instructed to contact office with any additional concerns. ER/RTC precautions were reviewed with the patient and understanding verbalized.

## 2017-01-28 ENCOUNTER — Other Ambulatory Visit: Payer: Self-pay | Admitting: Osteopathic Medicine

## 2017-01-28 DIAGNOSIS — I10 Essential (primary) hypertension: Secondary | ICD-10-CM

## 2017-02-10 ENCOUNTER — Ambulatory Visit: Payer: BC Managed Care – PPO

## 2017-02-15 ENCOUNTER — Ambulatory Visit: Payer: BC Managed Care – PPO

## 2017-02-21 ENCOUNTER — Ambulatory Visit: Payer: BC Managed Care – PPO

## 2017-04-12 ENCOUNTER — Other Ambulatory Visit: Payer: Self-pay

## 2017-04-12 DIAGNOSIS — I10 Essential (primary) hypertension: Secondary | ICD-10-CM

## 2017-04-12 MED ORDER — LISINOPRIL-HYDROCHLOROTHIAZIDE 10-12.5 MG PO TABS: 1.0000 | ORAL_TABLET | Freq: Every day | ORAL | 2 refills | Status: DC

## 2017-04-13 ENCOUNTER — Other Ambulatory Visit: Payer: Self-pay

## 2017-04-13 DIAGNOSIS — I10 Essential (primary) hypertension: Secondary | ICD-10-CM

## 2017-04-13 MED ORDER — LISINOPRIL-HYDROCHLOROTHIAZIDE 10-12.5 MG PO TABS: 1.0000 | ORAL_TABLET | Freq: Every day | ORAL | 0 refills | Status: DC

## 2017-08-16 ENCOUNTER — Ambulatory Visit (INDEPENDENT_AMBULATORY_CARE_PROVIDER_SITE_OTHER): Payer: BC Managed Care – PPO | Admitting: Osteopathic Medicine

## 2017-08-16 ENCOUNTER — Encounter: Payer: Self-pay | Admitting: Osteopathic Medicine

## 2017-08-16 VITALS — BP 137/64 | HR 84 | Ht 67.0 in | Wt 118.0 lb

## 2017-08-16 DIAGNOSIS — H1189 Other specified disorders of conjunctiva: Secondary | ICD-10-CM

## 2017-08-16 DIAGNOSIS — H11003 Unspecified pterygium of eye, bilateral: Secondary | ICD-10-CM | POA: Diagnosis not present

## 2017-08-16 DIAGNOSIS — I10 Essential (primary) hypertension: Secondary | ICD-10-CM

## 2017-08-16 MED ORDER — LISINOPRIL-HYDROCHLOROTHIAZIDE 10-12.5 MG PO TABS
1.0000 | ORAL_TABLET | Freq: Every day | ORAL | 3 refills | Status: DC
Start: 2017-08-16 — End: 2018-11-07

## 2017-08-16 MED ORDER — ERYTHROMYCIN 5 MG/GM OP OINT: 1.0000 "application " | TOPICAL_OINTMENT | Freq: Three times a day (TID) | OPHTHALMIC | 0 refills | Status: AC

## 2017-08-16 MED ORDER — ARTIFICIAL TEARS OPHTHALMIC OINT: TOPICAL_OINTMENT | Freq: Three times a day (TID) | OPHTHALMIC | 3 refills | Status: DC

## 2017-08-16 NOTE — Progress Notes (Signed)
HPI: Heather [name] is a 56 y.o. female who  has a past medical history of Hypertension.  she presents to [facility] today, 08/16/17,  for chief complaint of:  Chief Complaint  Patient presents with  . Conjunctivitis    Concerned for redness/irritation bilateral eyes but a little bit worse on the left. Some redness. No significant discharge/pain. No vision changes.  Has some questions about gaining weight, feels that she is a bit too thin. She is walking. She is not logging calories. No weightbearing exercise.   Past medical, surgical, social and family history reviewed:  Patient Active Problem List   Diagnosis Date Noted  . Annual physical exam 04/13/2016  . Cardiac risk counseling 04/13/2016  . Failure to attend appointment 04/06/2016  . Essential hypertension 03/23/2016    Past Surgical History:  Procedure Laterality Date  . CESAREAN SECTION    . TUBAL LIGATION      Social History   Tobacco Use  . Smoking status: Current Every Day Smoker  . Smokeless tobacco: Never Used  Substance Use Topics  . Alcohol use: Yes    Family History  Problem Relation Age of Onset  . Hypertension Mother   . Diabetes Mother   . Hypertension Sister      Current medication list and allergy/intolerance information reviewed:    Current Outpatient Medications  Medication Sig Dispense Refill  . hydrochlorothiazide (HYDRODIURIL) 25 MG tablet Take 0.5 tablets (12.5 mg total) by mouth daily. 90 tablet 0   No current facility-administered medications for this visit.     No Known Allergies    Review of Systems:  Constitutional:  No  fever, no chills, No recent illness.   HEENT: No  headache, no vision change, no hearing change, No sore throat, No  sinus pressure  Cardiac: No  chest pain  Respiratory:  No  shortness of breath.    Exam:  BP 137/64   Pulse 84   Ht 5\' 7"  (1.702 m)   Wt 118 lb (53.5 kg)   BMI 18.48 kg/m    Constitutional: VS see above. General Appearance: alert, well-developed, well-nourished, NAD  Eyes: Normal lids and eyelashes, non-icteric sclera. Small early developing pterygium bilaterally, left eye a bit more pronounced and some conjunctival irritation. Irritation of the tissue  Ears, Nose, Mouth, Throat: MMM, Normal external inspection ears/nares/mouth/lips/gums  Neck: No masses, trachea midline. No thyroid enlargement. No tenderness/mass appreciated. No lymphadenopathy  Respiratory: Normal respiratory effort.   Neurological: Normal balance/coordination. No tremor.   Skin: warm, dry, intact.   Psychiatric: Normal judgment/insight. Normal mood and affect. Oriented x3.     ASSESSMENT/PLAN:   Pterygium of both eyes - Comment mild conjunctivitis. Would consider of them all G referral if no better with Lacri-Lube, gave erythromycin prescription in case eyes are worse over the   Conjunctival irritation - Plan: artificial tears (LACRILUBE) OINT ophthalmic ointment, erythromycin ophthalmic ointment  Essential hypertension - Plan: lisinopril-hydrochlorothiazide (PRINZIDE,ZESTORETIC) 10-12.5 MG tablet   Discussion as well on strategies for healthy weight gain and production of muscle mass. All questions answered     Visit summary with medication list and pertinent instructions was printed for patient to review. All questions at time of visit were answered - patient instructed to contact office with any additional concerns. ER/RTC precautions were reviewed with the patient. Follow-up plan: Return for [place] 2019.  Note: Total time spent 25 minutes, greater than 50% of the visit was spent face-to-face counseling and coordinating  care for the following: The primary encounter diagnosis was Pterygium of both eyes. Diagnoses of Conjunctival irritation and Essential hypertension were also pertinent to this visit.Marland Kitchen  Please note: voice recognition software was used to produce  this document, and typos may escape review. Please contact Dr. Sheppard Coil for any needed clarifications.

## 2018-01-10 ENCOUNTER — Other Ambulatory Visit: Payer: Self-pay | Admitting: Osteopathic Medicine

## 2018-01-10 DIAGNOSIS — I10 Essential (primary) hypertension: Secondary | ICD-10-CM

## 2018-09-04 ENCOUNTER — Encounter: Payer: Self-pay | Admitting: Osteopathic Medicine

## 2018-09-04 ENCOUNTER — Ambulatory Visit (INDEPENDENT_AMBULATORY_CARE_PROVIDER_SITE_OTHER): Payer: BC Managed Care – PPO | Admitting: Osteopathic Medicine

## 2018-09-04 VITALS — BP 149/64 | HR 96 | Temp 97.8°F | Wt 122.0 lb

## 2018-09-04 DIAGNOSIS — N63 Unspecified lump in unspecified breast: Secondary | ICD-10-CM | POA: Diagnosis not present

## 2018-09-04 NOTE — Progress Notes (Signed)
HPI: Heather [name] is a 57 y.o. female who  has a past medical history of Hypertension.  she presents to [facility] today, 09/04/18,  for chief complaint of:  Breast lump  . Context: found on self-exam. No previous mammo results on file, ordered awhile ago 04/2016 but this order expired  . Location: L breast  . Quality: tender, sore. No rash, no nipple d/c  . Duration: 1 week        At today's visit... Past medical history, surgical history, and family history reviewed and updated as needed.  Current medication list and allergy/intolerance information reviewed and updated as needed. (See remainder of HPI, ROS, Phys Exam below)          ASSESSMENT/PLAN: The encounter diagnosis was Breast mass in female.   Orders Placed This Encounter  Procedures  . MM Digital Diagnostic Bilat  . US BREAST COMPLETE UNI LEFT INC AXILLA     Pt advised call number below if she doesn't hear back   Patient Instructions  [facility] Address: [address], [place], [place] [address] Phone: [phone]     Follow-up plan: Return for annual wellness physical w/ routine fasting labs, sometime next 3 months .                             ############################################ ############################################ ############################################ ############################################    Current Meds  Medication Sig  . artificial tears (LACRILUBE) OINT ophthalmic ointment Place 3 (three) times daily into both eyes. As needed for dry eye  . lisinopril-hydrochlorothiazide (PRINZIDE,ZESTORETIC) 10-12.5 MG tablet Take 1 tablet daily by mouth.    No Known Allergies     Review of Systems:  Constitutional: No recent illness  Cardiac: No  chest pain  Musculoskeletal: No new myalgia/arthralgia  Skin: No  Rash  Hem/Onc: No  easy bruising/bleeding,  +abnormal lumps/bumps   Exam:  BP (!) 149/64   Pulse 96   Temp 97.8 F (36.6 C) (Oral)   Wt 122 lb (55.3 kg)   BMI 19.11 kg/m   Constitutional: VS see above. General Appearance: alert, well-developed, well-nourished, NAD  Eyes: Normal lids and conjunctive, non-icteric sclera  Ears, Nose, Mouth, Throat: MMM, Normal external inspection ears/nares/mouth/lips/gums.  Neck: No masses, trachea midline.   Respiratory: Normal respiratory effort.  Musculoskeletal: Gait normal.   Neurological: Normal balance/coordination. No tremor.  Skin: warm, dry, intact.   Psychiatric: Normal judgment/insight. Normal mood and affect. Oriented x3.   BREAST: No rashes/skin changes, normal fibrous breast tissue, +mass/tenderness on L breast 3:00 concerning for possible calcification/malignancy - hard and slightly irregular rather than spherical/cystic, normal nipple without discharge, normal axilla, normal R breast        Visit summary with medication list and pertinent instructions was printed for patient to review, patient was advised to alert [place] if any updates are needed. All questions at time of visit were answered - patient instructed to contact office with any additional concerns. ER/RTC precautions were reviewed with the patient and understanding verbalized.     Please note: voice recognition software was used to produce this document, and typos may escape review. Please contact Dr. Sheppard Coil for any needed clarifications.    Follow up plan: Return for annual wellness physical w/ routine fasting labs, sometime next 3 months .

## 2018-09-04 NOTE — Patient Instructions (Signed)
[facility] Address: [address], [place], [place] [address] Phone: [phone]

## 2018-09-07 ENCOUNTER — Ambulatory Visit
Admission: RE | Admit: 2018-09-07 | Discharge: 2018-09-07 | Disposition: A | Discharge status: Home / Self Care | Payer: BC Managed Care – PPO | Source: Ambulatory Visit | Attending: Osteopathic Medicine | Admitting: Osteopathic Medicine

## 2018-09-07 ENCOUNTER — Other Ambulatory Visit: Payer: Self-pay | Admitting: Osteopathic Medicine

## 2018-09-07 DIAGNOSIS — N63 Unspecified lump in unspecified breast: Secondary | ICD-10-CM

## 2018-09-07 DIAGNOSIS — R599 Enlarged lymph nodes, unspecified: Secondary | ICD-10-CM

## 2018-09-12 ENCOUNTER — Ambulatory Visit
Admission: RE | Admit: 2018-09-12 | Discharge: 2018-09-12 | Disposition: A | Discharge status: Home / Self Care | Payer: BC Managed Care – PPO | Source: Ambulatory Visit | Attending: Osteopathic Medicine | Admitting: Osteopathic Medicine

## 2018-09-12 DIAGNOSIS — R599 Enlarged lymph nodes, unspecified: Secondary | ICD-10-CM

## 2018-09-12 DIAGNOSIS — N63 Unspecified lump in unspecified breast: Secondary | ICD-10-CM

## 2018-09-13 ENCOUNTER — Other Ambulatory Visit: Payer: Self-pay | Admitting: Osteopathic Medicine

## 2018-09-13 DIAGNOSIS — C50919 Malignant neoplasm of unspecified site of unspecified female breast: Secondary | ICD-10-CM

## 2018-09-17 ENCOUNTER — Telehealth: Payer: Self-pay | Admitting: *Deleted

## 2018-09-17 DIAGNOSIS — Z170 Estrogen receptor positive status [ER+]: Secondary | ICD-10-CM

## 2018-09-17 DIAGNOSIS — C50412 Malignant neoplasm of upper-outer quadrant of left female breast: Secondary | ICD-10-CM

## 2018-09-17 HISTORY — DX: Estrogen receptor positive status (ER+): Z17.0

## 2018-09-17 HISTORY — DX: Estrogen receptor positive status (ER+): C50.412

## 2018-09-17 NOTE — Telephone Encounter (Signed)
Confirmed [facility] for 09/19/18 at 1215 .  Instructions and contact information given.

## 2018-09-18 ENCOUNTER — Ambulatory Visit
Admission: RE | Admit: 2018-09-18 | Discharge: 2018-09-18 | Disposition: A | Discharge status: Home / Self Care | Payer: BC Managed Care – PPO | Source: Ambulatory Visit | Attending: Osteopathic Medicine | Admitting: Osteopathic Medicine

## 2018-09-18 DIAGNOSIS — C50919 Malignant neoplasm of unspecified site of unspecified female breast: Secondary | ICD-10-CM

## 2018-09-18 NOTE — Progress Notes (Signed)
[facility] CONSULT NOTE  Patient Care Team: Emeterio Reeve, DO as PCP - General (Osteopathic Medicine) Rolm Bookbinder, MD as Consulting Physician (General Surgery) Nicholas Lose, MD as Consulting Physician (Hematology and Oncology) Eppie Gibson, MD as Attending Physician (Radiation Oncology)  CHIEF COMPLAINTS/PURPOSE OF CONSULTATION:  Newly diagnosed breast cancer  HISTORY OF PRESENTING ILLNESS:  Heather [name] 57 y.o. female is here because of recent diagnosis of invasive ductal carcinoma with DCIS of the left breast. The cancer was palpable by the patient prior to diagnosis. A diagnostic mammogram and breast US from 09/07/18 showed multiple masses and calcifications spanning 5.9cm in the upper left breast and a suspicious left, axillary lymph node. A biopsy on 09/12/18 and showed the tumor to be grade 2, HER2 negative, ER positive 100%, PR positive 60%, with Ki67 30% and the lymph node was negative for carcinoma. She presents to the clinic today alone and noted her mother-in-law passed away very recently and the funeral is this week.   I reviewed her records extensively and collaborated the history with the patient.  REVIEW OF SYSTEMS:   Constitutional: Denies fevers, chills or abnormal night sweats Eyes: Denies blurriness of vision, double vision or watery eyes Ears, nose, mouth, throat, and face: Denies mucositis or sore throat Respiratory: Denies cough, dyspnea or wheezes Cardiovascular: Denies palpitation, chest discomfort or lower extremity swelling Gastrointestinal:  Denies nausea, heartburn or change in bowel habits Skin: Denies abnormal skin rashes Lymphatics: Denies new lymphadenopathy or easy bruising Neurological:Denies numbness, tingling or new weaknesses Behavioral/Psych: Mood is stable, no new changes  Breast: Denies any discharge All other systems were reviewed with the patient and are negative.  SUMMARY OF ONCOLOGIC HISTORY:   Malignant  neoplasm of upper-outer quadrant of left breast in female, estrogen receptor positive ([place])   09/12/2018 Initial Diagnosis    Palpable left breast mass at 2:30 position by ultrasound measured 1.6 cm, calcifications spanning 5.9 cm, 3 abnormal lymph nodes, multiple masses and calcifications biopsy of the mass revealed grade 2 IDC plus DCIS with lymphovascular invasion, ER 100%, PR 60%, Ki-67 30%, HER-2 negative IHC 1+, pathology of calcifications is pending, biopsy of lymph nodes is discordant benign, T1cN0 stage Ia    09/19/2018 Cancer Staging    Staging form: Breast, AJCC 8th Edition - Clinical stage from 09/19/2018: Stage IA (cT1c(m), cN0, cM0, G2, ER+, PR+, HER2-) - Signed by Nicholas Lose, MD on 09/19/2018    MEDICAL HISTORY:  Past Medical History:  Diagnosis Date  . Hypertension     SURGICAL HISTORY: Past Surgical History:  Procedure Laterality Date  . CESAREAN SECTION    . TUBAL LIGATION      SOCIAL HISTORY: Social History   Socioeconomic History  . Marital status: Married    Spouse name: Not on file  . Number of children: Not on file  . Years of education: Not on file  . Highest education level: Not on file  Occupational History  . Not on file  Social Needs  . Financial resource strain: Not on file  . Food insecurity:    Worry: Not on file    Inability: Not on file  . Transportation needs:    Medical: Not on file    Non-medical: Not on file  Tobacco Use  . Smoking status: Former Research scientist (life sciences)  . Smokeless tobacco: Never Used  Substance and Sexual Activity  . Alcohol use: Yes  . Drug use: No  . Sexual activity: Not on file  Lifestyle  . Physical  activity:    Days per week: Not on file    Minutes per session: Not on file  . Stress: Not on file  Relationships  . Social connections:    Talks on phone: Not on file    Gets together: Not on file    Attends religious service: Not on file    Active member of club or organization: Not on file    Attends meetings of  clubs or organizations: Not on file    Relationship status: Not on file  . Intimate partner violence:    Fear of current or ex partner: Not on file    Emotionally abused: Not on file    Physically abused: Not on file    Forced sexual activity: Not on file  Other Topics Concern  . Not on file  Social History Narrative  . Not on file    FAMILY HISTORY: Family History  Problem Relation Age of Onset  . Hypertension Mother   . Diabetes Mother   . Hypertension Sister   . Breast cancer Maternal Aunt 90  . Breast cancer Cousin 60    ALLERGIES:  has No Known Allergies.  MEDICATIONS:  Current Outpatient Medications  Medication Sig Dispense Refill  . lisinopril-hydrochlorothiazide (PRINZIDE,ZESTORETIC) 10-12.5 MG tablet Take 1 tablet daily by mouth. 90 tablet 3  . letrozole (FEMARA) 2.5 MG tablet Take 1 tablet (2.5 mg total) by mouth daily. 30 tablet 1   No current facility-administered medications for this visit.    PHYSICAL EXAMINATION: ECOG PERFORMANCE STATUS: 1 - Symptomatic but completely ambulatory  Vitals:   09/19/18 1319  BP: 132/66  Pulse: 99  Resp: 18  Temp: 98 F (36.7 C)  SpO2: 98%   Filed Weights   09/19/18 1319  Weight: 112 lb 12.8 oz (51.2 kg)    GENERAL:alert, no distress and comfortable SKIN: skin color, texture, turgor are normal, no rashes or significant lesions EYES: normal, conjunctiva are pink and non-injected, sclera clear OROPHARYNX:no exudate, no erythema and lips, buccal mucosa, and tongue normal  NECK: supple, thyroid normal size, non-tender, without nodularity LYMPH:  no palpable lymphadenopathy in the cervical, axillary or inguinal LUNGS: clear to auscultation and percussion with normal breathing effort HEART: regular rate & rhythm and no murmurs and no lower extremity edema ABDOMEN:abdomen soft, non-tender and normal bowel sounds Musculoskeletal:no cyanosis of digits and no clubbing  PSYCH: alert & oriented x 3 with fluent speech NEURO:  no focal motor/sensory deficits  LABORATORY DATA:  I have reviewed the data as listed Lab Results  Component Value Date   WBC 7.6 09/19/2018   HGB 13.0 09/19/2018   HCT 41.0 09/19/2018   MCV 83.7 09/19/2018   PLT 321 09/19/2018   Lab Results  Component Value Date   NA 139 09/19/2018   K 3.9 09/19/2018   CL 103 09/19/2018   CO2 26 09/19/2018    RADIOGRAPHIC STUDIES: I have personally reviewed the radiological reports and agreed with the findings in the report.  ASSESSMENT AND PLAN:  Malignant neoplasm of upper-outer quadrant of left breast in female, estrogen receptor positive ([place]) 09/12/2018:Palpable left breast mass at 2:30 position by ultrasound measured 1.6 cm, calcifications spanning 5.9 cm, 3 abnormal lymph nodes, multiple masses and calcifications biopsy of the mass revealed grade 2 IDC plus DCIS with lymphovascular invasion, ER 100%, PR 60%, Ki-67 30%, HER-2 negative IHC 1+, pathology of calcifications is pending, biopsy of lymph nodes is discordant benign, T1cN0 stage Ia  Pathology and radiology counseling:Discussed with the  patient, the details of pathology including the type of breast cancer,the clinical staging, the significance of ER, PR and HER-2/neu receptors and the implications for treatment. After reviewing the pathology in detail, we proceeded to discuss the different treatment options between surgery, radiation, chemotherapy, antiestrogen therapies.  Recommendations: 1.  Mastectomy with targeted lymph node dissection followed by 2. Oncotype DX testing/MammaPrint to determine if chemotherapy would be of any benefit followed by 3. Adjuvant radiation therapy followed by 4. Adjuvant antiestrogen therapy  Oncotype counseling: I discussed Oncotype DX test. I explained to the patient that this is a 21 gene panel to evaluate patient tumors DNA to calculate recurrence score. This would help determine whether patient has high risk or intermediate risk or low risk breast  cancer. She understands that if her tumor was found to be high risk, she would benefit from systemic chemotherapy. If low risk, no need of chemotherapy. If she was found to be intermediate risk, we would need to evaluate the score as well as other risk factors and determine if an abbreviated chemotherapy may be of benefit.  Return to clinic after surgery to discuss final pathology report and then determine if Oncotype DX testing are MammaPrint will need to be sent.  All questions were answered. The patient knows to call the clinic with any problems, questions or concerns.   Nicholas Lose, MD 09/19/2018   I, Cloyde Reams Dorshimer, am acting as scribe for Nicholas Lose, MD.  I have reviewed the above documentation for accuracy and completeness, and I agree with the above.

## 2018-09-19 ENCOUNTER — Ambulatory Visit: Payer: BC Managed Care – PPO | Attending: General Surgery | Admitting: Physical Therapy

## 2018-09-19 ENCOUNTER — Encounter: Payer: Self-pay | Admitting: Radiation Oncology

## 2018-09-19 ENCOUNTER — Encounter: Payer: Self-pay | Admitting: Hematology and Oncology

## 2018-09-19 ENCOUNTER — Ambulatory Visit
Admission: RE | Admit: 2018-09-19 | Discharge: 2018-09-19 | Disposition: A | Discharge status: Home / Self Care | Payer: BC Managed Care – PPO | Source: Ambulatory Visit | Attending: Radiation Oncology | Admitting: Radiation Oncology

## 2018-09-19 ENCOUNTER — Other Ambulatory Visit: Payer: Self-pay

## 2018-09-19 ENCOUNTER — Inpatient Hospital Stay (HOSPITAL_BASED_OUTPATIENT_CLINIC_OR_DEPARTMENT_OTHER): Payer: BC Managed Care – PPO | Admitting: Hematology and Oncology

## 2018-09-19 ENCOUNTER — Encounter: Payer: Self-pay | Admitting: Physical Therapy

## 2018-09-19 ENCOUNTER — Inpatient Hospital Stay: Payer: BC Managed Care – PPO

## 2018-09-19 DIAGNOSIS — I10 Essential (primary) hypertension: Secondary | ICD-10-CM | POA: Diagnosis not present

## 2018-09-19 DIAGNOSIS — Z170 Estrogen receptor positive status [ER+]: Principal | ICD-10-CM

## 2018-09-19 DIAGNOSIS — C50412 Malignant neoplasm of upper-outer quadrant of left female breast: Secondary | ICD-10-CM | POA: Insufficient documentation

## 2018-09-19 DIAGNOSIS — R293 Abnormal posture: Secondary | ICD-10-CM | POA: Diagnosis present

## 2018-09-19 DIAGNOSIS — Z87891 Personal history of nicotine dependence: Secondary | ICD-10-CM

## 2018-09-19 DIAGNOSIS — Z79899 Other long term (current) drug therapy: Secondary | ICD-10-CM | POA: Insufficient documentation

## 2018-09-19 LAB — CBC WITH DIFFERENTIAL (CANCER CENTER ONLY)
Abs Immature Granulocytes: 0.02 10*3/uL (ref 0.00–0.07)
Basophils Absolute: 0 10*3/uL (ref 0.0–0.1)
Basophils Relative: 0 %
Eosinophils Absolute: 0 10*3/uL (ref 0.0–0.5)
Eosinophils Relative: 0 %
HCT: 41 % (ref 36.0–46.0)
Hemoglobin: 13 g/dL (ref 12.0–15.0)
Immature Granulocytes: 0 %
Lymphocytes Relative: 23 %
Lymphs Abs: 1.7 10*3/uL (ref 0.7–4.0)
MCH: 26.5 pg (ref 26.0–34.0)
MCHC: 31.7 g/dL (ref 30.0–36.0)
MCV: 83.7 fL (ref 80.0–100.0)
MONO ABS: 0.5 10*3/uL (ref 0.1–1.0)
Monocytes Relative: 6 %
Neutro Abs: 5.4 10*3/uL (ref 1.7–7.7)
Neutrophils Relative %: 71 %
Platelet Count: 321 10*3/uL (ref 150–400)
RBC: 4.9 MIL/uL (ref 3.87–5.11)
RDW: 13.5 % (ref 11.5–15.5)
WBC Count: 7.6 10*3/uL (ref 4.0–10.5)
nRBC: 0 % (ref 0.0–0.2)

## 2018-09-19 LAB — CMP (CANCER CENTER ONLY)
ALK PHOS: 68 U/L (ref 38–126)
ALT: 15 U/L (ref 0–44)
AST: 26 U/L (ref 15–41)
Albumin: 4.4 g/dL (ref 3.5–5.0)
Anion gap: 10 (ref 5–15)
BUN: 12 mg/dL (ref 6–20)
CO2: 26 mmol/L (ref 22–32)
Calcium: 9.7 mg/dL (ref 8.9–10.3)
Chloride: 103 mmol/L (ref 98–111)
Creatinine: 0.88 mg/dL (ref 0.44–1.00)
GFR, Est AFR Am: >60 mL/min (ref >60)
GFR, Estimated: >60 mL/min (ref >60)
Glucose, Bld: 146 mg/dL — ABNORMAL HIGH (ref 70–99)
Potassium: 3.9 mmol/L (ref 3.5–5.1)
Sodium: 139 mmol/L (ref 135–145)
Total Bilirubin: 0.6 mg/dL (ref 0.3–1.2)
Total Protein: 7 g/dL (ref 6.5–8.1)

## 2018-09-19 MED ORDER — LETROZOLE 2.5 MG PO TABS: 2.5000 mg | ORAL_TABLET | Freq: Every day | ORAL | 1 refills | Status: DC

## 2018-09-19 NOTE — Progress Notes (Signed)
Radiation Oncology         [phone] ________________________________  Initial Outpatient Consultation  Name: Heather [name] MRN: [id number]  Date: 09/19/2018  DOB: [birth year]  CC:Emeterio Reeve, DO  Rolm Bookbinder, MD   REFERRING PHYSICIAN: Rolm Bookbinder, MD  DIAGNOSIS:    ICD-10-CM   1. Malignant neoplasm of upper-outer quadrant of left breast in female, estrogen receptor positive [facility]) C50.412    Z17.0   Cancer Staging Malignant neoplasm of upper-outer quadrant of left breast in female, estrogen receptor positive ([place]) Staging form: Breast, AJCC 8th Edition - Clinical stage from 09/19/2018: Stage IA (cT1c(m), cN0, cM0, G2, ER+, PR+, HER2-) - Signed by Nicholas Lose, MD on 09/19/2018  CHIEF COMPLAINT: Here to discuss management of left breast cancer  HISTORY OF PRESENT ILLNESS::Heather [name] is a 57 y.o. female who presented with a palpable lump along the lateral aspect of the left breast.   Diagnostic mammogram and ultrasound of breast on 09/07/18 revealed multiple masses with associated coarse heterogeneous, segmentally distributed calcifications in the upper outer to lateral left breast, spanning maximum of 5.9 cm, likely a combination of DCIS and invasive carcinoma. There were also 3 borderline to abnormal left axillary lymph nodes with mildly thickened cortices ranging from 0.5 to 4 mm, suspicious for metastatic adenopathy. No evidence of right breast malignancy.   Biopsy on date of 09/12/18 showed invasive ductal carcinoma; ER positive, PR positive, Her2 negative, Grade 2; with intermediate grade DCIS. Biopsy of the left axillary lymph node was negative (discordant).   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   On review of systems, the patient is positive for breast pain, and anxiety from diagnosis, and poor appetite/weight  loss.  PREVIOUS RADIATION THERAPY: No  PAST MEDICAL HISTORY:  has a past medical history of Hypertension.    PAST SURGICAL HISTORY: Past Surgical History:  Procedure Laterality Date  . CESAREAN SECTION    . TUBAL LIGATION      FAMILY HISTORY: family history includes Breast cancer (age of onset: 45) in her cousin; Breast cancer (age of onset: 75) in her maternal aunt; Diabetes in her mother; Hypertension in her mother and sister.  SOCIAL HISTORY:  reports that she has quit smoking. She has never used smokeless tobacco. She reports current alcohol use. She reports that she does not use drugs.  ALLERGIES: Patient has no known allergies.  MEDICATIONS:  Current Outpatient Medications  Medication Sig Dispense Refill  . letrozole (FEMARA) 2.5 MG tablet Take 1 tablet (2.5 mg total) by mouth daily. 30 tablet 1  . lisinopril-hydrochlorothiazide (PRINZIDE,ZESTORETIC) 10-12.5 MG tablet Take 1 tablet daily by mouth. 90 tablet 3   No current facility-administered medications for this encounter.     REVIEW OF SYSTEMS: A 10+ POINT REVIEW OF SYSTEMS WAS OBTAINED including neurology, dermatology, psychiatry, cardiac, respiratory, lymph, extremities, GI, GU, Musculoskeletal, constitutional, breasts, reproductive, HEENT.  All pertinent positives are noted in the HPI.  All others are negative.   PHYSICAL EXAM:  Vitals with BMI 09/19/2018  Height 5' 6.6"  Weight 112 lbs 13 oz  BMI 16.60  Systolic 630  Diastolic 66  Pulse 99  Respirations 18   General: Alert and oriented, in no acute distress HEENT: Head is normocephalic. Extraocular movements are intact. Oropharynx is clear. Neck: Neck is supple, no palpable cervical or supraclavicular lymphadenopathy. Heart: Regular in rate and rhythm with no murmurs, rubs, or gallops. Chest: Clear  to auscultation bilaterally, with no rhonchi, wheezes, or rales. Abdomen: Soft, nontender, nondistended, with no rigidity or guarding. Extremities: No cyanosis or  edema. Lymphatics: see Neck Exam Skin: No concerning lesions. Musculoskeletal: symmetric strength and muscle tone throughout. Neurologic: Cranial nerves II through XII are grossly intact. No obvious focalities. Speech is fluent. Coordination is intact. Psychiatric: Judgment and insight are intact. Affect is appropriate. Breasts: 5-6cm region of firmness spanning across upper left breast;breast tenderness inhibits exam;  subcentimeter left axillary node, mobile. No other palpable masses appreciated in the breasts or axillae otherwise .  ECOG = 0  0 - Asymptomatic (Fully active, able to carry on all predisease activities without restriction)  1 - Symptomatic but completely ambulatory (Restricted in physically strenuous activity but ambulatory and able to carry out work of a light or sedentary nature. For example, light housework, office work)  2 - Symptomatic, <50% in bed during the day (Ambulatory and capable of all self care but unable to carry out any work activities. Up and about more than 50% of waking hours)  3 - Symptomatic, >50% in bed, but not bedbound (Capable of only limited self-care, confined to bed or chair 50% or more of waking hours)  4 - Bedbound (Completely disabled. Cannot carry on any self-care. Totally confined to bed or chair)  5 - Death   Eustace Pen MM, Creech RH, Tormey DC, et al. [phone]). "Toxicity and response criteria of the [facility]". [place] Oncol. 5 (6): 649-55   LABORATORY DATA:  Lab Results  Component Value Date   WBC 7.6 09/19/2018   HGB 13.0 09/19/2018   HCT 41.0 09/19/2018   MCV 83.7 09/19/2018   PLT 321 09/19/2018   CMP     Component Value Date/Time   NA 139 09/19/2018 1220   K 3.9 09/19/2018 1220   CL 103 09/19/2018 1220   CO2 26 09/19/2018 1220   GLUCOSE 146 (H) 09/19/2018 1220   BUN 12 09/19/2018 1220   CREATININE 0.88 09/19/2018 1220   CREATININE 0.84 03/29/2016 0820   CALCIUM 9.7 09/19/2018 1220   PROT 7.0  09/19/2018 1220   ALBUMIN 4.4 09/19/2018 1220   AST 26 09/19/2018 1220   ALT 15 09/19/2018 1220   ALKPHOS 68 09/19/2018 1220   BILITOT 0.6 09/19/2018 1220   GFRNONAA >60 09/19/2018 1220   GFRNONAA 78 03/29/2016 0820   GFRAA >60 09/19/2018 1220   GFRAA >89 03/29/2016 0820         RADIOGRAPHY: US Breast Ltd Uni Left Inc Axilla  Addendum Date: 09/17/2018   ADDENDUM REPORT: 09/17/2018 15:14 ADDENDUM: Three borderline to abnormal lymph nodes in left axilla ranging from cortical thicknesses of 0.5 mm to 4 mm. Electronically Signed   By: Lajean Manes M.D.   On: 09/17/2018 15:14   Result Date: 09/17/2018 CLINICAL DATA:  Patient presents with a palpable lump along the lateral aspect of the left breast. EXAM: DIGITAL DIAGNOSTIC BILATERAL MAMMOGRAM WITH CAD AND TOMO ULTRASOUND LEFT BREAST COMPARISON:  None. ACR Breast Density Category d: The breast tissue is extremely dense, which lowers the sensitivity of mammography. FINDINGS: In the left breast, there are segmentally distributed coarse heterogeneous calcifications extend from just lateral and deep to the nipple to the posterolateral aspect the breast, spanning 5.9 x 3.6 x 4.4 cm. There are associated poorly defined masses, the largest corresponding to the palpable abnormality. There are no areas of architectural distortion. On the right, there are no discrete masses or areas of architectural distortion  or suspicious calcifications. Mammographic images were processed with CAD. On physical exam, there are firm palpable masses along the lateral aspect the left breast, the largest corresponding to the reported palpable abnormality. Targeted ultrasound is performed, showing multiple heterogeneous hypoechoic masses with associated calcifications along the 2-3 o'clock position of the left breast. The largest mass corresponds to the palpable abnormality, measuring 1.6 x 1.0 x 1.5 cm. Masses show vascularity on color Doppler analysis. In the left axilla, there  are several lymph nodes with mildly thickened cortices, maximum measurement of 4 mm. IMPRESSION: 1. Multiple masses with associated coarse heterogeneous, segmentally distributed calcifications in the upper outer to lateral left breast spanning maximum of 5.9 cm highly suspicious for primary breast malignancy, likely a combination of DCIS and invasive carcinoma. 2. Left axillary lymph nodes with mildly thickened cortices suspicious for metastatic adenopathy. 3. No evidence of right breast malignancy. RECOMMENDATION: 1. Ultrasound-guided core needle biopsy of the palpable left breast mass as well as 1 of the abnormal left axillary lymph nodes. The patient will be scheduled today these procedures. I have discussed the findings and recommendations with the patient. Results were also provided in writing at the conclusion of the visit. If applicable, a reminder letter will be sent to the patient regarding the next appointment. BI-RADS CATEGORY  5: Highly suggestive of malignancy. Electronically Signed: By: Lajean Manes M.D. On: 09/07/2018 10:17   Mm Diag Breast Tomo Bilateral  Addendum Date: 09/17/2018   ADDENDUM REPORT: 09/17/2018 15:14 ADDENDUM: Three borderline to abnormal lymph nodes in left axilla ranging from cortical thicknesses of 0.5 mm to 4 mm. Electronically Signed   By: Lajean Manes M.D.   On: 09/17/2018 15:14   Result Date: 09/17/2018 CLINICAL DATA:  Patient presents with a palpable lump along the lateral aspect of the left breast. EXAM: DIGITAL DIAGNOSTIC BILATERAL MAMMOGRAM WITH CAD AND TOMO ULTRASOUND LEFT BREAST COMPARISON:  None. ACR Breast Density Category d: The breast tissue is extremely dense, which lowers the sensitivity of mammography. FINDINGS: In the left breast, there are segmentally distributed coarse heterogeneous calcifications extend from just lateral and deep to the nipple to the posterolateral aspect the breast, spanning 5.9 x 3.6 x 4.4 cm. There are associated poorly defined  masses, the largest corresponding to the palpable abnormality. There are no areas of architectural distortion. On the right, there are no discrete masses or areas of architectural distortion or suspicious calcifications. Mammographic images were processed with CAD. On physical exam, there are firm palpable masses along the lateral aspect the left breast, the largest corresponding to the reported palpable abnormality. Targeted ultrasound is performed, showing multiple heterogeneous hypoechoic masses with associated calcifications along the 2-3 o'clock position of the left breast. The largest mass corresponds to the palpable abnormality, measuring 1.6 x 1.0 x 1.5 cm. Masses show vascularity on color Doppler analysis. In the left axilla, there are several lymph nodes with mildly thickened cortices, maximum measurement of 4 mm. IMPRESSION: 1. Multiple masses with associated coarse heterogeneous, segmentally distributed calcifications in the upper outer to lateral left breast spanning maximum of 5.9 cm highly suspicious for primary breast malignancy, likely a combination of DCIS and invasive carcinoma. 2. Left axillary lymph nodes with mildly thickened cortices suspicious for metastatic adenopathy. 3. No evidence of right breast malignancy. RECOMMENDATION: 1. Ultrasound-guided core needle biopsy of the palpable left breast mass as well as 1 of the abnormal left axillary lymph nodes. The patient will be scheduled today these procedures. I have discussed the findings and recommendations  with the patient. Results were also provided in writing at the conclusion of the visit. If applicable, a reminder letter will be sent to the patient regarding the next appointment. BI-RADS CATEGORY  5: Highly suggestive of malignancy. Electronically Signed: By: Lajean Manes M.D. On: 09/07/2018 10:17   [place] Axillary Node Core Biopsy Left  Addendum Date: 09/18/2018   ADDENDUM REPORT: 09/17/2018 08:02 ADDENDUM: Pathology revealed GRADE II  INVASIVE DUCTAL CARCINOMA, INTERMEDIATE NUCLEAR GRADE DUCTAL CARCINOMA IN SITU of the LEFT breast, upper outer. This was found to be concordant by Dr. Hassan Rowan. Pathology revealed LYMPH NODE, NEGATIVE FOR CARCINOMA of the Left axilla. This was found to be discordant by Dr. Hassan Rowan, with targeted node excision recommended. Pathology results were discussed with the patient by telephone. The patient reported doing well after the biopsies with tenderness at the sites. Post biopsy instructions and care were reviewed and questions were answered. The patient was encouraged to call [facility] for any additional concerns. The patient was referred to [facility] on September 19, 2018. The patient is scheduled for a Left breast Stereotatic guided biopsy on September 18, 2018 at [facility]. Pathology results reported by Terie Purser, RN on 09/17/2018. Electronically Signed   By: Margarette Canada M.D.   On: 09/17/2018 08:02   Result Date: 09/17/2018 CLINICAL DATA:  57 year old female with suspicious masses/calcifications within the UPPER-OUTER LEFT breast and enlarged LEFT axillary lymph nodes. For tissue sampling of the largest UPPER-OUTER LEFT breast mass and 1 of the enlarged LEFT axillary lymph nodes. EXAM: ULTRASOUND GUIDED LEFT BREAST CORE NEEDLE BIOPSY [place] AXILLARY NODE CORE BIOPSY RIGHT COMPARISON:  Previous exam(s). FINDINGS: I met with the patient and we discussed the procedures of ultrasound-guided biopsy, including benefits and alternatives. We discussed the high likelihood of successful procedures. We discussed the risks of the procedures, including infection, bleeding, tissue injury, clip migration, and inadequate sampling. Informed written consent was given. The usual time-out protocol was performed immediately prior to the procedures. ULTRASOUND-GUIDED LEFT BREAST CORE NEEDLE BIOPSY: Lesion quadrant:  UPPER-OUTER LEFT BREAST Using sterile technique and 1% Lidocaine as local anesthetic, under direct ultrasound visualization, a 14 gauge spring-loaded device was used to perform biopsy of 1.6 cm mass with calcifications at the 2:30 position of the LEFT breast using a MEDIAL approach. At the conclusion of the procedure a RIBBON tissue marker clip was deployed into the biopsy cavity. Follow up 2 view mammogram was performed and dictated separately. ULTRASOUND-GUIDED LEFT AXILLARY LYMPH NODE CORE NEEDLE BIOPSY: Using sterile technique and 1% Lidocaine as local anesthetic, under direct ultrasound visualization, a 14 gauge spring-loaded device was used to perform biopsy of 1 of the enlarged LEFT axillary lymph node using a INFERIOR approach. At the conclusion of the procedure a HydroMARK tissue marker clip was deployed into the biopsy cavity. Follow up 2 view mammogram was performed and dictated separately. IMPRESSION: Ultrasound guided biopsy of a 1.6 cm UPPER-OUTER LEFT breast mass and a mildly enlarged LEFT axillary lymph node. No apparent complications. Electronically Signed: By: Margarette Canada M.D. On: 09/12/2018 13:43   Mm Clip Placement Left  Result Date: 09/18/2018 CLINICAL DATA:  Evaluate COIL clip placement following stereotactic guided LEFT breast biopsy of anterior extent of OUTER LEFT breast calcifications EXAM: DIAGNOSTIC LEFT MAMMOGRAM POST STEREOTACTIC BIOPSY COMPARISON:  Previous exam(s). FINDINGS: Mammographic images were obtained following stereotactic guided biopsy of anterior extent of calcifications within the anterior OUTER  LEFT breast. The COIL clip is in satisfactory position. The RIBBON clip is again noted corresponding to the biopsy-proven malignancy within the UPPER-OUTER LEFT breast. The COIL clip and the RIBBON clip are separated by a distance of at least 4 cm on the CC view. The COIL clip is separated by a distance of at least 6 cm from visualized posterior calcifications. IMPRESSION:  Satisfactory COIL clip position following stereotactic guided LEFT breast biopsy. Please note the COIL clip is separated by a distance of at least 6 cm from visualized posterior extent of calcifications. Final Assessment: Post Procedure Mammograms for Marker Placement Electronically Signed   By: Margarette Canada M.D.   On: 09/18/2018 11:20   Mm Clip Placement Left  Result Date: 09/12/2018 CLINICAL DATA:  Evaluate placement of biopsy clips following ultrasound-guided biopsies of the LEFT breast and LEFT axilla. EXAM: DIAGNOSTIC LEFT MAMMOGRAM POST ULTRASOUND BIOPSY COMPARISON:  Previous exam(s). FINDINGS: Mammographic images were obtained following ultrasound guided biopsy of the 1.6 cm UPPER-OUTER LEFT breast mass and a prominent LEFT axillary lymph node. The RIBBON clip is in satisfactory position corresponding to the 1.6 cm biopsied mass, but only seen on the MLO view. The [facility] clip is not visualized on this study due to far superoposterior location. The [facility] clip was confirmed sonographically to be in satisfactory position in the biopsied lymph node. IMPRESSION: Satisfactory position of RIBBON clip corresponding to the 1.6 cm UPPER-OUTER LEFT breast mass which was biopsied, but visualized only on one view. HydroMARK clip within the biopsied LEFT axillary lymph node is not visualized, but satisfactory placement was confirmed sonographically. Final Assessment: Post Procedure Mammograms for Marker Placement Electronically Signed   By: Margarette Canada M.D.   On: 09/12/2018 14:08   Mm Lt Breast Bx W Loc Dev 1st Lesion Image Bx Spec Stereo Guide  Addendum Date: 09/19/2018   ADDENDUM REPORT: 09/19/2018 11:58 ADDENDUM: Pathology revealed INTERMEDIATE GRADE DUCTAL CARCINOMA IN SITU of the LEFT breast, anterior lower outer. This was found to be concordant by Dr. Hassan Rowan. Pathology results will be discussed with the patient by Dr. Rolm Bookbinder at [facility] on September 19, 2018. The patient has a recent diagnosis of left breast cancer and should follow her outlined treatment plan. Pathology results reported by Terie Purser, RN on 09/19/2018. Electronically Signed   By: Margarette Canada M.D.   On: 09/19/2018 11:58   Result Date: 09/19/2018 CLINICAL DATA:  57 year old female with recent diagnosis of LEFT breast cancer, for tissue sampling of anterior extent of calcifications within the anterior OUTER LEFT breast. EXAM: LEFT BREAST STEREOTACTIC CORE NEEDLE BIOPSY COMPARISON:  Previous exams. FINDINGS: The patient and I discussed the procedure of stereotactic-guided biopsy including benefits and alternatives. We discussed the high likelihood of a successful procedure. We discussed the risks of the procedure including infection, bleeding, tissue injury, clip migration, and inadequate sampling. Informed written consent was given. The usual time out protocol was performed immediately prior to the procedure. Using sterile technique and 1% Lidocaine as local anesthetic, under stereotactic guidance, a 9 gauge vacuum assisted device was used to perform core needle biopsy of calcifications within the anterior LOWER OUTER quadrant of the LEFT breast using a SUPERIOR approach. Specimen radiograph was performed showing calcifications. Specimens with calcifications are identified for pathology. Lesion quadrant: LOWER OUTER LEFT breast At the conclusion of the procedure, a COIL tissue marker clip was deployed into the biopsy cavity. Follow-up 2-view mammogram was performed  and dictated separately. IMPRESSION: Stereotactic-guided biopsy of anterior extent of calcifications within the OUTER LEFT breast. No apparent complications. Electronically Signed: By: Margarette Canada M.D. On: 09/18/2018 11:15   [place] Lt Breast Bx W Loc Dev 1st Lesion Img Bx Spec US Guide  Addendum Date: 09/18/2018   ADDENDUM REPORT: 09/17/2018 08:02 ADDENDUM: Pathology revealed GRADE II  INVASIVE DUCTAL CARCINOMA, INTERMEDIATE NUCLEAR GRADE DUCTAL CARCINOMA IN SITU of the LEFT breast, upper outer. This was found to be concordant by Dr. Hassan Rowan. Pathology revealed LYMPH NODE, NEGATIVE FOR CARCINOMA of the Left axilla. This was found to be discordant by Dr. Hassan Rowan, with targeted node excision recommended. Pathology results were discussed with the patient by telephone. The patient reported doing well after the biopsies with tenderness at the sites. Post biopsy instructions and care were reviewed and questions were answered. The patient was encouraged to call [facility] for any additional concerns. The patient was referred to [facility] at [facility] on September 19, 2018. The patient is scheduled for a Left breast Stereotatic guided biopsy on September 18, 2018 at [facility]. Pathology results reported by Terie Purser, RN on 09/17/2018. Electronically Signed   By: Margarette Canada M.D.   On: 09/17/2018 08:02   Result Date: 09/17/2018 CLINICAL DATA:  57 year old female with suspicious masses/calcifications within the UPPER-OUTER LEFT breast and enlarged LEFT axillary lymph nodes. For tissue sampling of the largest UPPER-OUTER LEFT breast mass and 1 of the enlarged LEFT axillary lymph nodes. EXAM: ULTRASOUND GUIDED LEFT BREAST CORE NEEDLE BIOPSY [place] AXILLARY NODE CORE BIOPSY RIGHT COMPARISON:  Previous exam(s). FINDINGS: I met with the patient and we discussed the procedures of ultrasound-guided biopsy, including benefits and alternatives. We discussed the high likelihood of successful procedures. We discussed the risks of the procedures, including infection, bleeding, tissue injury, clip migration, and inadequate sampling. Informed written consent was given. The usual time-out protocol was performed immediately prior to the procedures. ULTRASOUND-GUIDED LEFT BREAST CORE NEEDLE BIOPSY: Lesion quadrant:  UPPER-OUTER LEFT BREAST Using sterile technique and 1% Lidocaine as local anesthetic, under direct ultrasound visualization, a 14 gauge spring-loaded device was used to perform biopsy of 1.6 cm mass with calcifications at the 2:30 position of the LEFT breast using a MEDIAL approach. At the conclusion of the procedure a RIBBON tissue marker clip was deployed into the biopsy cavity. Follow up 2 view mammogram was performed and dictated separately. ULTRASOUND-GUIDED LEFT AXILLARY LYMPH NODE CORE NEEDLE BIOPSY: Using sterile technique and 1% Lidocaine as local anesthetic, under direct ultrasound visualization, a 14 gauge spring-loaded device was used to perform biopsy of 1 of the enlarged LEFT axillary lymph node using a INFERIOR approach. At the conclusion of the procedure a HydroMARK tissue marker clip was deployed into the biopsy cavity. Follow up 2 view mammogram was performed and dictated separately. IMPRESSION: Ultrasound guided biopsy of a 1.6 cm UPPER-OUTER LEFT breast mass and a mildly enlarged LEFT axillary lymph node. No apparent complications. Electronically Signed: By: Margarette Canada M.D. On: 09/12/2018 13:43      IMPRESSION/PLAN: Left Breast Cancer  It was a pleasure meeting the patient today.  We discussed that she will likely need a mastectomy. We discussed potential indications for post mastectomy radiation.  If chemotherapy were to be given, this would precede radiotherapy.   We spoke about acute effects including skin irritation and fatigue as well as much less common late effects including internal organ injury  or irritation. We spoke about the latest technology that is used to minimize the risk of late effects for patients undergoing radiotherapy to the breast or chest wall. No guarantees of treatment were given.  I will await her referral back to me for postoperative follow-up and eventual CT simulation/treatment planning if warranted.  _______________________________________  Eppie Gibson, MD  This document serves as a record of services personally performed by Eppie Gibson, MD. It was created on her behalf by Rae Lips, a trained medical scribe. The creation of this record is based on the scribe's personal observations and the provider's statements to them. This document has been checked and approved by the attending provider.

## 2018-09-19 NOTE — Assessment & Plan Note (Signed)
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 After reviewing the pathology in detail, we proceeded to discuss the different treatment options between surgery, radiation, chemotherapy, antiestrogen therapies.  Recommendations: 1.  Mastectomy with targeted lymph node dissection followed by 2. Oncotype DX testing to determine if chemotherapy would be of any benefit followed by 3. Adjuvant radiation therapy followed by 4. Adjuvant antiestrogen therapy  Oncotype counseling: I discussed Oncotype DX test. I explained to the patient that this is a 21 gene panel to evaluate patient tumors DNA to calculate recurrence score. This would help determine whether patient has high risk or intermediate risk or low risk breast cancer. She understands that if her tumor was found to be high risk, she would benefit from systemic chemotherapy. If low risk, no need of chemotherapy. If she was found to be intermediate risk, we would need to evaluate the score as well as other risk factors and determine if an abbreviated chemotherapy may be of benefit.  Return to clinic after surgery to discuss final pathology report and then determine if Oncotype DX testing will need to be sent.

## 2018-09-19 NOTE — Therapy (Signed)
[place], [place], [address] Phone: [phone]   Fax:  [phone]  Physical Therapy Evaluation  Patient Details  Name: Heather [name] MRN: [id number] Date of Birth: May 26, 1961 Referring Provider (PT): Dr. Rolm Bookbinder   Encounter Date: 09/19/2018  PT End of Session - 09/19/18 1352    Visit Number  1    Number of Visits  2    Date for PT Re-Evaluation  11/14/18    PT Start Time  0349    PT Stop Time  1507    PT Time Calculation (min)  29 min    Activity Tolerance  Patient tolerated treatment well    Behavior During Therapy  [facility] for tasks assessed/performed       Past Medical History:  Diagnosis Date  . Hypertension     Past Surgical History:  Procedure Laterality Date  . CESAREAN SECTION    . TUBAL LIGATION      There were no vitals filed for this visit.   Subjective Assessment - 09/19/18 1340    Subjective  Patient reports she is here today to be seen by her medical team for her newly diagnosed left breast cancer.    Patient is accompained by:  --    Pertinent History  Patient was diagnosed on 09/07/18 with left grade II invasive ductal carcinoma breast cancer. The mass measures 1.6 cm and is located in the upper outer quadrant. She also has 6 cm of calcification. It is ER/PR positive and HER2 negative with a Ki67 of 30%. There are 3 abnormal appearing nodes in her left axilla.    Patient Stated Goals  Reduce lymphedema risk and learn post op shoulder ROM HEP    Currently in Pain?  No/denies         [facility] PT Assessment - 09/19/18 0001      Assessment   Medical Diagnosis  Left breast cancer    Referring Provider (PT)  Dr. Rolm Bookbinder    Onset Date/Surgical Date  09/07/18    Hand Dominance  Right    Prior Therapy  none      Precautions   Precautions  Other (comment)    Precaution Comments  active cancer      Restrictions   Weight Bearing Restrictions  No       Balance Screen   Has the patient fallen in the past 6 months  No    Has the patient had a decrease in activity level because of a fear of falling?   No    Is the patient reluctant to leave their home because of a fear of falling?   No      Home Environment   Living Environment  Private residence    Living Arrangements  Spouse/significant other;Children   Husband, 7 and 62 y.o. children   Available Help at Discharge  Family      Prior Function   Level of Independence  Independent    Vocation  Full time employment    Lexicographer at [employer]  She does not exercise      Cognition   Overall Cognitive Status  Within Functional Limits for tasks assessed      Posture/Postural Control   Posture/Postural Control  Postural limitations    Postural Limitations  Rounded Shoulders;Forward head      ROM / Strength   AROM / PROM / Strength  AROM;Strength  AROM   AROM Assessment Site  Shoulder;Cervical    Right/Left Shoulder  Right;Left    Right Shoulder Extension  38 Degrees    Right Shoulder Flexion  122 Degrees    Right Shoulder ABduction  150 Degrees    Right Shoulder Internal Rotation  65 Degrees    Right Shoulder External Rotation  77 Degrees    Left Shoulder Extension  42 Degrees    Left Shoulder Flexion  128 Degrees    Left Shoulder ABduction  148 Degrees    Left Shoulder Internal Rotation  66 Degrees    Left Shoulder External Rotation  85 Degrees    Cervical Flexion  WNL    Cervical Extension  WNL    Cervical - Right Side Bend  WNL    Cervical - Left Side Bend  WNL    Cervical - Right Rotation  WNL    Cervical - Left Rotation  WNL      Strength   Overall Strength  Within functional limits for tasks performed        LYMPHEDEMA/ONCOLOGY QUESTIONNAIRE - 09/19/18 1352      Type   Cancer Type  Left breast      Lymphedema Assessments   Lymphedema Assessments  Upper extremities      Right Upper Extremity Lymphedema   10  cm Proximal to Olecranon Process  23.3 cm    Olecranon Process  22.3 cm    10 cm Proximal to Ulnar Styloid Process  18.9 cm    Just Proximal to Ulnar Styloid Process  14.7 cm    Across Hand at [employer]  18.4 cm    At [place] of 2nd Digit  6.3 cm      Left Upper Extremity Lymphedema   10 cm Proximal to Olecranon Process  23.6 cm    Olecranon Process  22.4 cm    10 cm Proximal to Ulnar Styloid Process  17.7 cm    Just Proximal to Ulnar Styloid Process  14.3 cm    Across Hand at [employer]  18.5 cm    At [place] of 2nd Digit  5.8 cm             Objective measurements completed on examination: See above findings.         Patient was instructed today in a home exercise program today for post op shoulder range of motion. These included active assist shoulder flexion in sitting, scapular retraction, wall walking with shoulder abduction, and hands behind head external rotation.  She was encouraged to do these twice a day, holding 3 seconds and repeating 5 times when permitted by her physician.         PT Education - 09/19/18 1352    Education Details  Lymphedema risk reduction and post op shoulder ROM HEP    Person(s) Educated  Patient    Methods  Explanation;Demonstration;Handout    Comprehension  Returned demonstration;Verbalized understanding          PT Long Term Goals - 09/19/18 1427      PT LONG TERM GOAL #1   Title  Patient will demonstrate she has regained full shoulder ROM and function compared to baseline measurements.    Time  8    Period  Weeks    Status  New      Breast Clinic Goals - 09/19/18 1427      Patient will be able to verbalize understanding of pertinent lymphedema risk reduction practices relevant  to her diagnosis specifically related to skin care.   Time  1    Period  Days    Status  Achieved      Patient will be able to return demonstrate and/or verbalize understanding of the post-op home exercise program related to regaining  shoulder range of motion.   Time  1    Period  Days    Status  Achieved      Patient will be able to verbalize understanding of the importance of attending the postoperative After Breast Cancer Class for further lymphedema risk reduction education and therapeutic exercise.   Time  1    Period  Days    Status  Achieved            Plan - 09/19/18 1353    Clinical Impression Statement  Patient was diagnosed on 09/07/18 with left grade II invasive ductal carcinoma breast cancer. The mass measures 1.6 cm and is located in the upper outer quadrant. She also has 6 cm of calcification. It is ER/PR positive and HER2 negative with a Ki67 of 30%. There are 3 abnormal appearing nodes in her left axilla. Her multidisciplinary medical team met prior to hre assessments to determine a recommended treatment plan. She is planning to have a left mastectomy and axillary lymph node dissection followed by Oncotype testing, radiation, and anti-estrogen therapy. She will benefit from a post op PT reassessment to determine needs.    Clinical Presentation  Stable    Clinical Decision Making  Low    Rehab Potential  Excellent    Clinical Impairments Affecting Rehab Potential  None    PT Frequency  --   Eval and 1 f/u visit   PT Treatment/Interventions  ADLs/Self Care Home Management;Patient/family education;Therapeutic exercise    PT Next Visit Plan  Will reassess 3-4 weeks post op    PT Home Exercise Plan  Post op shoulder ROM HEP    Consulted and Agree with Plan of Care  Patient       Patient will benefit from skilled therapeutic intervention in order to improve the following deficits and impairments:  Postural dysfunction, Decreased knowledge of precautions, Pain, Impaired UE functional use, Decreased range of motion  Visit Diagnosis: Malignant neoplasm of upper-outer quadrant of left breast in female, estrogen receptor positive ([place]) - Plan: PT plan of care cert/re-cert  Abnormal posture - Plan: PT plan  of care cert/re-cert   Patient will follow up at outpatient cancer rehab 3-4 weeks following surgery.  If the patient requires physical therapy at that time, a specific plan will be dictated and sent to the referring physician for approval. The patient was educated today on appropriate basic range of motion exercises to begin post operatively and the importance of attending the After Breast Cancer class following surgery.  Patient was educated today on lymphedema risk reduction practices as it pertains to recommendations that will benefit the patient immediately following surgery.  She verbalized good understanding.     Problem List Patient Active Problem List   Diagnosis Date Noted  . Malignant neoplasm of upper-outer quadrant of left breast in female, estrogen receptor positive ([place]) 09/17/2018  . Breast mass in female 09/04/2018  . Conjunctival irritation 08/16/2017  . Annual physical exam 04/13/2016  . Cardiac risk counseling 04/13/2016  . Failure to attend appointment 04/06/2016  . Essential hypertension 03/23/2016    Annia Friendly, PT 09/19/18 3:39 PM  [place], [place],  [address] Phone: [phone]   Fax:  [phone]  Name: Heather [name] MRN: [id number] Date of Birth: 06-10-1961

## 2018-09-19 NOTE — Patient Instructions (Signed)

## 2018-09-20 ENCOUNTER — Telehealth: Payer: Self-pay | Admitting: Hematology and Oncology

## 2018-09-20 ENCOUNTER — Encounter: Payer: Self-pay | Admitting: *Deleted

## 2018-09-20 ENCOUNTER — Encounter: Payer: Self-pay | Admitting: General Practice

## 2018-09-20 NOTE — Progress Notes (Signed)
[place] presented to Breast Multidisciplinary Clinic to introduce [place] team/resources, completing distress screen per protocol.  The patient scored a [unspecified] on the Psychosocial Distress Thermometer which indicates [unspecified] distress.   ONCBCN DISTRESS SCREENING 09/20/2018  Screening Type Initial Screening  Referral to support programs Yes    Follow up needed: No. Per pt, feels better now with knowing plan. Please page if immediate needs arise or circumstances change. Thank you.   [place], [place], [facility] Pager [phone] Voicemail [phone]

## 2018-09-20 NOTE — Telephone Encounter (Signed)
Per 12/18 no los °

## 2018-09-27 ENCOUNTER — Other Ambulatory Visit: Payer: BC Managed Care – PPO

## 2018-09-27 ENCOUNTER — Telehealth: Payer: Self-pay | Admitting: *Deleted

## 2018-09-27 NOTE — Telephone Encounter (Signed)
  Oncology Nurse Navigator Documentation  Navigator Location: [facility] (09/27/18 1500)   )Navigator Encounter Type: Telephone;MDC Follow-up (09/27/18 1500) Telephone: Outgoing Call;Clinic/MDC Follow-up (09/27/18 1500)                                                  Time Spent with Patient: 15 (09/27/18 1500)

## 2018-10-11 ENCOUNTER — Telehealth: Payer: Self-pay | Admitting: *Deleted

## 2018-10-11 NOTE — Telephone Encounter (Signed)
Attempted to return a phone call from patient. Left voice message.

## 2018-10-15 ENCOUNTER — Other Ambulatory Visit: Payer: Self-pay | Admitting: Hematology and Oncology

## 2018-10-17 ENCOUNTER — Other Ambulatory Visit: Payer: Self-pay | Admitting: General Surgery

## 2018-10-17 DIAGNOSIS — C50412 Malignant neoplasm of upper-outer quadrant of left female breast: Secondary | ICD-10-CM

## 2018-10-17 DIAGNOSIS — Z170 Estrogen receptor positive status [ER+]: Principal | ICD-10-CM

## 2018-10-22 ENCOUNTER — Telehealth: Payer: Self-pay | Admitting: Hematology and Oncology

## 2018-10-22 NOTE — Telephone Encounter (Signed)
Scheduled appt per 1/17 sch message - unable to reach patient - left message and sent reminder letter in the mail.

## 2018-10-24 ENCOUNTER — Inpatient Hospital Stay: Payer: BC Managed Care – PPO | Attending: Genetic Counselor | Admitting: Genetic Counselor

## 2018-10-24 ENCOUNTER — Inpatient Hospital Stay: Payer: BC Managed Care – PPO

## 2018-10-24 DIAGNOSIS — Z803 Family history of malignant neoplasm of breast: Secondary | ICD-10-CM | POA: Diagnosis not present

## 2018-10-24 DIAGNOSIS — Z170 Estrogen receptor positive status [ER+]: Secondary | ICD-10-CM | POA: Diagnosis not present

## 2018-10-24 DIAGNOSIS — C50412 Malignant neoplasm of upper-outer quadrant of left female breast: Secondary | ICD-10-CM

## 2018-10-25 ENCOUNTER — Encounter: Payer: Self-pay | Admitting: Genetic Counselor

## 2018-10-25 DIAGNOSIS — Z803 Family history of malignant neoplasm of breast: Secondary | ICD-10-CM | POA: Insufficient documentation

## 2018-10-25 NOTE — Progress Notes (Signed)
REFERRING PROVIDER: Nicholas Lose, MD [address] [place], [place] [address]  PRIMARY PROVIDER:  Emeterio Reeve, DO  PRIMARY REASON FOR VISIT:  1. Malignant neoplasm of upper-outer quadrant of left breast in female, estrogen receptor positive ([place])   2. Family history of breast cancer      HISTORY OF PRESENT ILLNESS:   Heather [name], a 58 y.o. female, was seen for a [place] cancer genetics consultation at the request of Dr. Lindi Adie due to a personal and family history of cancer.  Heather [name] presents to clinic today to discuss the possibility of a hereditary predisposition to cancer, genetic testing, and to further clarify her future cancer risks, as well as potential cancer risks for family members.   In December 2019, at the age of 32, Heather [name] was diagnosed with DCIS of the left breast. This will be treated with a unilateral mastectomy.       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  First live birth at age 31.  OCP use for approximately 20 years.  Ovaries intact: yes.  Hysterectomy: no.  Menopausal status: postmenopausal.  HRT use: 0 years. Colonoscopy: no; not examined. Mammogram within the last  year: yes. Number of breast biopsies: 1. Up to date with pelvic exams:  yes. Any excessive radiation exposure in the past:  no  Past Medical History:  Diagnosis Date  . Family history of breast cancer   . Hypertension     Past Surgical History:  Procedure Laterality Date  . CESAREAN SECTION    . TUBAL LIGATION      Social History   Socioeconomic History  . Marital status: Married    Spouse name: Not on file  . Number of children: Not on file  . Years of education: Not on file  . Highest education level: Not on file  Occupational History  . Not on file  Social Needs  . Financial resource strain: Not on file  . Food insecurity:    Worry: Not on file    Inability: Not on file  . Transportation needs:    Medical: Not on file    Non-medical: Not on file  Tobacco Use  . Smoking status: Former Research scientist (life sciences)  . Smokeless tobacco: Never Used  Substance and Sexual Activity  . Alcohol use: Yes  . Drug use: No  . Sexual activity: Not on file  Lifestyle  . Physical activity:    Days per week: Not on file    Minutes per session: Not on file  . Stress: Not on file  Relationships  . Social connections:    Talks on phone: Not on file    Gets together: Not on file    Attends religious service:  Not on file    Active member of club or organization: Not on file    Attends meetings of clubs or organizations: Not on file    Relationship status: Not on file  Other Topics Concern  . Not on file  Social History Narrative  . Not on file     FAMILY HISTORY:  We obtained a detailed, 4-generation family history.  Significant diagnoses are listed below: Family History  Problem Relation Age of Onset  . Hypertension Mother   . Diabetes Mother   . Hypertension Sister   . Breast cancer Maternal Aunt 90  . Breast cancer Cousin 27       mat first cousin  . Heart attack Father 66    The patient has a son and daughter who are cancer free.  She has seven sisters, one full brother and one  maternal half brother.  All area cancer free, but her half brother has passed away.  The patient's mother is living and her father is deceased.  The patient's father died of a heart attack at 61.  He has one sister who is cancer free.  His parents are deceased from natural causes.  The patient's mother is one of 8 children.  She has one sister who had breast cancer at 51, and another sister who did not have cancer, had a daughter with breast cancer.  The maternal grandparents are deceased from natural causes.  Heather [name] is unaware of previous family history of genetic testing for hereditary cancer risks. Patient's maternal ancestors are of African American descent, and paternal ancestors are of African American descent. There is no reported Ashkenazi Jewish ancestry. There is no known consanguinity.  GENETIC COUNSELING ASSESSMENT: Heather [name] is a 58 y.o. female with a personal and family history of breast cancer which is somewhat suggestive of a hereditary cancer syndrome and predisposition to cancer. We, therefore, discussed and recommended the following at today's visit.   DISCUSSION: We discussed that about 5-10% of breast cancer is hereditary, with most cases due to BRCA mutations.  There are other genes that are associated with hereditary breast cancer syndromes, outside of just BRCA.    We reviewed the characteristics, features and inheritance patterns of hereditary cancer syndromes. We also discussed genetic testing, including the appropriate family members to test, the process of testing, insurance coverage and turn-around-time for results. We discussed the implications of a negative, positive and/or variant of uncertain significant result. We recommended Heather [name] pursue genetic testing for the common hereditary cancer gene panel. The Hereditary Gene Panel offered by [facility] includes sequencing and/or deletion duplication testing of the following 47 genes: APC, ATM, AXIN2, BARD1,  BMPR1A, BRCA1, BRCA2, BRIP1, CDH1, CDK4, CDKN2A (p14ARF), CDKN2A (p16INK4a), CHEK2, CTNNA1, DICER1, EPCAM (Deletion/duplication testing only), GREM1 (promoter region deletion/duplication testing only), KIT, MEN1, MLH1, MSH2, MSH3, MSH6, MUTYH, NBN, NF1, NHTL1, PALB2, PDGFRA, PMS2, POLD1, POLE, PTEN, RAD50, RAD51C, RAD51D, SDHB, SDHC, SDHD, SMAD4, SMARCA4. STK11, TP53, TSC1, TSC2, and VHL.  The following genes were evaluated for sequence changes only: SDHA and HOXB13 c.251G>A variant only.   Based on Heather [name]'s personal and family history of cancer, she meets medical criteria for genetic testing. Despite that she meets criteria, she may still have an out of pocket cost. We discussed that if her out of pocket cost for testing is over $100, the laboratory will call and confirm whether she wants to proceed with testing.  If the out of pocket cost of testing is less than $  100 she will be billed by the genetic testing laboratory.   PLAN: Despite our recommendation, Heather [name] did not wish to pursue genetic testing at today's visit. She wanted to talk with her husband about testing. We understand this decision, and remain available to coordinate genetic testing at any time in the future. We could consolidate a blood draw with her testing, if she decides to do this in the future, or bring her in specifically for the testing. We, therefore, recommend Heather [name] continue to follow the cancer screening guidelines given by her primary healthcare provider.  Lastly, we encouraged Heather [name] to remain in contact with cancer genetics annually so that we can continuously update the family history and inform her of any changes in cancer genetics and testing that may be of benefit for this family.   Ms.  [name] questions were answered to her satisfaction today. Our contact information was provided should additional questions or concerns arise. Thank you for the referral and allowing [place] to share in the care of your  patient.    P. Florene Glen, [place], [facility] Certified Genetic Counselor [email or web address] phone: [phone]  The patient was seen for a total of 45 minutes in face-to-face genetic counseling.  This patient was discussed with Drs. Magrinat, Lindi Adie and/or Burr Medico who agrees with the above.    _______________________________________________________________________ For Office Staff:  Number of people involved in session: 1 Was an Intern/ student involved with case: no

## 2018-11-07 ENCOUNTER — Other Ambulatory Visit: Payer: Self-pay | Admitting: Osteopathic Medicine

## 2018-11-07 DIAGNOSIS — I10 Essential (primary) hypertension: Secondary | ICD-10-CM

## 2018-11-08 NOTE — Pre-Procedure Instructions (Signed)
Heather [name]  11/08/2018      [id number] - [place], [place] - [address] [place] [address] Phone: [phone] Fax: [phone]    Your procedure is scheduled on February 11  Report to [place] at 1100 A.M.  Call this number if you have problems the morning of surgery:  [phone]   Remember:  Do not eat after midnight.  You may drink clear liquids until 1000 .  Clear liquids allowed are:  Water, Juice (non-citric and without pulp), Carbonated beverages, Clear Tea, Black Coffee only and Gatorade    There are no medications that you need to take the morning of surgery    Do not wear jewelry, make-up or nail polish.  Do not wear lotions, powders, or perfumes, or deodorant.  Do not shave 48 hours prior to surgery.   Do not bring valuables to the hospital.  [facility] is not responsible for any belongings or valuables.  Contacts, dentures or bridgework may not be worn into surgery.  Leave your suitcase in the car.  After surgery it may be brought to your room.  For patients admitted to the hospital, discharge time will be determined by your treatment team.  Patients discharged the day of surgery will not be allowed to drive home.    Special instructions:   [place]- Preparing For Surgery  Before surgery, you can play an important role. Because skin is not sterile, your skin needs to be as free of germs as possible. You can reduce the number of germs on your skin by washing with CHG (chlorahexidine gluconate) Soap before surgery.  CHG is an antiseptic cleaner which kills germs and bonds with the skin to continue killing germs even after washing.    Oral Hygiene is also important to reduce your risk of infection.  Remember - BRUSH YOUR TEETH THE MORNING OF SURGERY WITH YOUR REGULAR TOOTHPASTE  Please do not use if you have an allergy to CHG or antibacterial soaps. If your skin becomes  reddened/irritated stop using the CHG.  Do not shave (including legs and underarms) for at least 48 hours prior to first CHG shower. It is OK to shave your face.  Please follow these instructions carefully.   1. Shower the NIGHT BEFORE SURGERY and the MORNING OF SURGERY with CHG.   2. If you chose to wash your hair, wash your hair first as usual with your normal shampoo.  3. After you shampoo, rinse your hair and body thoroughly to remove the shampoo.  4. Use CHG as you would any other liquid soap. You can apply CHG directly to the skin and wash gently with a scrungie or a clean washcloth.   5. Apply the CHG Soap to your body ONLY FROM THE NECK DOWN.  Do not use on open wounds or open sores. Avoid contact with your eyes, ears, mouth and genitals (private parts). Wash Face and genitals (private parts)  with your normal soap.  6. Wash thoroughly, paying special attention to the area where your surgery will be performed.  7. Thoroughly rinse your body with warm water from the neck down.  8. DO NOT shower/wash with your normal soap after using and rinsing off the CHG Soap.  9. Pat yourself dry with a CLEAN TOWEL.  10. Wear CLEAN PAJAMAS to bed the night before surgery, wear comfortable clothes the morning of surgery  11. Place CLEAN SHEETS on your bed the  night of your first shower and DO NOT SLEEP WITH PETS.    Day of Surgery:  Do not apply any deodorants/lotions.  Please wear clean clothes to the hospital/surgery center.   Remember to brush your teeth WITH YOUR REGULAR TOOTHPASTE.    Please read over the following fact sheets that you were given.

## 2018-11-08 NOTE — H&P (Signed)
Subjective:     Patient ID: Heather [name] is a 58 y.o. female.  HPI  Here for follow up discussion breast reconstruction prior to planned mastectomy left. Presented with palpable left breast mass. MMG/US showed multiple masses with associated calcifications along the 2-3 o'clock position of the left breast. The largest mass corresponds to the palpable abnormality, measuring 1.6 x 1.0 x 1.5 cm. In the left axilla, there were several LN with mild thickened cortices.  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  On Femara. Plan Oncotype vs Mammaprint.   Current 34 B, fine with this. Quit smoking 09/2018  Works as Tax adviser.     Objective:   Physical Exam  Cardiovascular: Normal rate and normal heart sounds.  Pulmonary/Chest: Effort normal and breath sounds normal.  Abdominal:  No soft tissue redundance  Lymphadenopathy:    She has no axillary adenopathy.  Skin:  Fitzpatrick 5   No ptosis bilateral, palpable left breast mass LOQ,  induration in area prior scar radial at 3 o clock SN to nipple R 19.5 L 18. 5 cm BW R 14 L 14 (BW 11 cm) Nipple to IMF R 7 L 7 cm    Assessment:     Left breast ca UOQ ER+    Plan:     Plan left NSM with immediate tissue expander acellular dermis reconstruction.   Reviewed incisions, drains, OR length, hospital stay and recovery, limitations. Discussed process of expansion and implant based risks including rupture, MRI surveillance for silicone implants, infection requiring surgery or removal, contracture. Reviewed risks mastectomy flap necrosis requiring additional surgery.  Discussed use of acellular dermis in reconstruction, cadaveric source, incorporation over several weeks, risk that if has seroma or infection can act as additional nidus for infection if not incorporated.  Discussed prepectoral vs sub  pectoral reconstruction. Discussed with patient and benefit of this is no animation deformity, may be less pain. Risk may be more visible rippling over upper poles, greater need of ADM. Reviewed pre pectoral would require larger amount acellular dermis, more drains. Discussed any type reconstruction also risks long term displacement implant and visible rippling. If prepectoral counseled I would recommend she be comfortable with silicone implants as more options that have less rippling. She agrees to prepectoral placement.  Reviewed reconstruction will be asensate and not stimulate. Reviewed additional risks including but not limited to risks mastectomy flap necrosis requiring additional surgery, seroma, hematoma, asymmetry, need to additional procedures, fat necrosis, DVT/PE, damage to adjacent structures, cardiopulmonary complications.  Rx for Second to [place] given. Completed ASPS off label usage of ADM.   Irene Limbo, MD [facility] [phone], pin [phone]

## 2018-11-09 ENCOUNTER — Encounter (HOSPITAL_COMMUNITY): Payer: Self-pay

## 2018-11-09 ENCOUNTER — Other Ambulatory Visit: Payer: Self-pay

## 2018-11-09 ENCOUNTER — Encounter (HOSPITAL_COMMUNITY)
Admission: RE | Admit: 2018-11-09 | Discharge: 2018-11-09 | Disposition: A | Discharge status: Home / Self Care | Payer: BC Managed Care – PPO | Source: Ambulatory Visit | Attending: General Surgery | Admitting: General Surgery

## 2018-11-09 DIAGNOSIS — I10 Essential (primary) hypertension: Secondary | ICD-10-CM | POA: Diagnosis not present

## 2018-11-09 DIAGNOSIS — Z01818 Encounter for other preprocedural examination: Secondary | ICD-10-CM | POA: Insufficient documentation

## 2018-11-09 HISTORY — DX: Malignant (primary) neoplasm, unspecified: C80.1

## 2018-11-09 LAB — BASIC METABOLIC PANEL
Anion gap: 11 (ref 5–15)
BUN: 16 mg/dL (ref 6–20)
CO2: 24 mmol/L (ref 22–32)
Calcium: 9.5 mg/dL (ref 8.9–10.3)
Chloride: 108 mmol/L (ref 98–111)
Creatinine, Ser: 0.94 mg/dL (ref 0.44–1.00)
GFR calc Af Amer: >60 mL/min (ref >60)
GFR calc non Af Amer: >60 mL/min (ref >60)
Glucose, Bld: 129 mg/dL — ABNORMAL HIGH (ref 70–99)
Potassium: 4.8 mmol/L (ref 3.5–5.1)
Sodium: 143 mmol/L (ref 135–145)

## 2018-11-09 LAB — CBC
HCT: 38.4 % (ref 36.0–46.0)
Hemoglobin: 12.5 g/dL (ref 12.0–15.0)
MCH: 27.4 pg (ref 26.0–34.0)
MCHC: 32.6 g/dL (ref 30.0–36.0)
MCV: 84 fL (ref 80.0–100.0)
Platelets: 743 10*3/uL — ABNORMAL HIGH (ref 150–400)
RBC: 4.57 MIL/uL (ref 3.87–5.11)
RDW: 14.3 % (ref 11.5–15.5)
WBC: 6.8 10*3/uL (ref 4.0–10.5)
nRBC: 0 % (ref 0.0–0.2)

## 2018-11-09 NOTE — Progress Notes (Signed)
PCP - Emeterio Reeve, DO Cardiologist - denies  Chest x-ray - N/A EKG - 11/09/2018 Stress Test - denies ECHO - denies Cardiac Cath - denies  Sleep Study - denies  Blood Thinner Instructions: N/A Aspirin Instructions:N/A  Anesthesia review: No  Patient denies shortness of breath, fever, cough and chest pain at PAT appointment   Patient verbalized understanding of instructions that were given to them at the PAT appointment. Patient was also instructed that they will need to review over the PAT instructions again at home before surgery.

## 2018-11-13 ENCOUNTER — Encounter (HOSPITAL_COMMUNITY): Payer: Self-pay

## 2018-11-13 ENCOUNTER — Other Ambulatory Visit: Payer: Self-pay

## 2018-11-13 ENCOUNTER — Ambulatory Visit (HOSPITAL_COMMUNITY): Payer: BC Managed Care – PPO | Admitting: Anesthesiology

## 2018-11-13 ENCOUNTER — Observation Stay (HOSPITAL_COMMUNITY)
Admission: RE | Admit: 2018-11-13 | Discharge: 2018-11-14 | Disposition: A | Discharge status: Home / Self Care | Payer: BC Managed Care – PPO | Attending: General Surgery | Admitting: General Surgery

## 2018-11-13 ENCOUNTER — Encounter (HOSPITAL_COMMUNITY)
Admission: RE | Disposition: A | Discharge status: Home / Self Care | Payer: Self-pay | Source: Home / Self Care | Attending: General Surgery

## 2018-11-13 ENCOUNTER — Ambulatory Visit (HOSPITAL_COMMUNITY)
Admission: RE | Admit: 2018-11-13 | Discharge: 2018-11-13 | Disposition: A | Discharge status: Home / Self Care | Payer: BC Managed Care – PPO | Source: Ambulatory Visit | Attending: General Surgery | Admitting: General Surgery

## 2018-11-13 DIAGNOSIS — I10 Essential (primary) hypertension: Secondary | ICD-10-CM | POA: Diagnosis not present

## 2018-11-13 DIAGNOSIS — C50912 Malignant neoplasm of unspecified site of left female breast: Secondary | ICD-10-CM

## 2018-11-13 DIAGNOSIS — Z79899 Other long term (current) drug therapy: Secondary | ICD-10-CM | POA: Diagnosis not present

## 2018-11-13 DIAGNOSIS — Z170 Estrogen receptor positive status [ER+]: Secondary | ICD-10-CM | POA: Insufficient documentation

## 2018-11-13 DIAGNOSIS — C773 Secondary and unspecified malignant neoplasm of axilla and upper limb lymph nodes: Secondary | ICD-10-CM | POA: Insufficient documentation

## 2018-11-13 DIAGNOSIS — Z87891 Personal history of nicotine dependence: Secondary | ICD-10-CM | POA: Insufficient documentation

## 2018-11-13 DIAGNOSIS — C50412 Malignant neoplasm of upper-outer quadrant of left female breast: Secondary | ICD-10-CM | POA: Diagnosis present

## 2018-11-13 HISTORY — DX: Malignant neoplasm of unspecified site of left female breast: C50.912

## 2018-11-13 HISTORY — PX: BREAST RECONSTRUCTION WITH PLACEMENT OF TISSUE EXPANDER AND ALLODERM: SHX6805

## 2018-11-13 SURGERY — NIPPLE SPARING MASTECTOMY WITH SENTINEL LYMPH NODE BIOPSY
Anesthesia: Regional | Site: Chest | Laterality: Left

## 2018-11-13 MED ORDER — KETOROLAC TROMETHAMINE 15 MG/ML IJ SOLN
INTRAMUSCULAR | Status: AC
  Administered 2018-11-13: 15 mg via INTRAVENOUS
  Filled 2018-11-13: qty 1

## 2018-11-13 MED ORDER — LISINOPRIL-HYDROCHLOROTHIAZIDE 10-12.5 MG PO TABS: 1.0000 | ORAL_TABLET | Freq: Every day | ORAL | Status: DC

## 2018-11-13 MED ORDER — SUGAMMADEX SODIUM 200 MG/2ML IV SOLN
INTRAVENOUS | Status: DC | PRN
  Administered 2018-11-13: 100 mg via INTRAVENOUS
  Administered 2018-11-13: 50 mg via INTRAVENOUS
  Administered 2018-11-13: 100 mg via INTRAVENOUS

## 2018-11-13 MED ORDER — ONDANSETRON 4 MG PO TBDP: 4.0000 mg | ORAL_TABLET | Freq: Four times a day (QID) | ORAL | Status: DC | PRN

## 2018-11-13 MED ORDER — SODIUM CHLORIDE 0.9 % IV SOLN
INTRAVENOUS | Status: DC | PRN
  Administered 2018-11-13: 14:00:00

## 2018-11-13 MED ORDER — 0.9 % SODIUM CHLORIDE (POUR BTL) OPTIME
TOPICAL | Status: DC | PRN
  Administered 2018-11-13: 1000 mL

## 2018-11-13 MED ORDER — METHOCARBAMOL 500 MG PO TABS: 500.0000 mg | ORAL_TABLET | Freq: Four times a day (QID) | ORAL | Status: DC | PRN

## 2018-11-13 MED ORDER — ROCURONIUM BROMIDE 50 MG/5ML IV SOSY: PREFILLED_SYRINGE | INTRAVENOUS | Status: DC | PRN

## 2018-11-13 MED ORDER — CEFAZOLIN SODIUM-DEXTROSE 2-4 GM/100ML-% IV SOLN
INTRAVENOUS | Status: AC
  Filled 2018-11-13: qty 100

## 2018-11-13 MED ORDER — FENTANYL CITRATE (PF) 100 MCG/2ML IJ SOLN: 25.0000 ug | INTRAMUSCULAR | Status: DC | PRN

## 2018-11-13 MED ORDER — PROPOFOL 10 MG/ML IV BOLUS
INTRAVENOUS | Status: AC
  Filled 2018-11-13: qty 20

## 2018-11-13 MED ORDER — MIDAZOLAM HCL 2 MG/2ML IJ SOLN
INTRAMUSCULAR | Status: AC
  Filled 2018-11-13: qty 2

## 2018-11-13 MED ORDER — CLONIDINE HCL (ANALGESIA) 100 MCG/ML EP SOLN
EPIDURAL | Status: DC | PRN
  Administered 2018-11-13: 100 ug

## 2018-11-13 MED ORDER — SODIUM CHLORIDE 0.9 % IV SOLN
Freq: Once | INTRAVENOUS | Status: DC
  Filled 2018-11-13: qty 1

## 2018-11-13 MED ORDER — ACETAMINOPHEN 500 MG PO TABS
1000.0000 mg | ORAL_TABLET | ORAL | Status: AC
  Administered 2018-11-13: 1000 mg via ORAL

## 2018-11-13 MED ORDER — ONDANSETRON HCL 4 MG/2ML IJ SOLN: 4.0000 mg | Freq: Once | INTRAMUSCULAR | Status: DC | PRN

## 2018-11-13 MED ORDER — HYDROCODONE-ACETAMINOPHEN 7.5-325 MG PO TABS: 1.0000 | ORAL_TABLET | Freq: Once | ORAL | Status: DC | PRN

## 2018-11-13 MED ORDER — PROPOFOL 10 MG/ML IV BOLUS
INTRAVENOUS | Status: DC | PRN
  Administered 2018-11-13: 150 mg via INTRAVENOUS

## 2018-11-13 MED ORDER — LISINOPRIL 10 MG PO TABS: 10.0000 mg | ORAL_TABLET | Freq: Every day | ORAL | Status: DC

## 2018-11-13 MED ORDER — EPHEDRINE SULFATE 50 MG/ML IJ SOLN
INTRAMUSCULAR | Status: DC | PRN
  Administered 2018-11-13 (×2): 10 mg via INTRAVENOUS

## 2018-11-13 MED ORDER — KETOROLAC TROMETHAMINE 30 MG/ML IJ SOLN
30.0000 mg | Freq: Three times a day (TID) | INTRAMUSCULAR | Status: DC
  Administered 2018-11-13 – 2018-11-14 (×2): 30 mg via INTRAVENOUS
  Filled 2018-11-13 (×2): qty 1

## 2018-11-13 MED ORDER — FENTANYL CITRATE (PF) 100 MCG/2ML IJ SOLN
INTRAMUSCULAR | Status: DC | PRN
  Administered 2018-11-13: 50 ug via INTRAVENOUS
  Administered 2018-11-13: 100 ug via INTRAVENOUS

## 2018-11-13 MED ORDER — ROCURONIUM BROMIDE 50 MG/5ML IV SOSY
PREFILLED_SYRINGE | INTRAVENOUS | Status: DC | PRN
  Administered 2018-11-13: 10 mg via INTRAVENOUS
  Administered 2018-11-13: 40 mg via INTRAVENOUS
  Administered 2018-11-13: 20 mg via INTRAVENOUS

## 2018-11-13 MED ORDER — TECHNETIUM TC 99M SULFUR COLLOID FILTERED
1.0000 | Freq: Once | INTRAVENOUS | Status: AC | PRN
  Administered 2018-11-13: 1 via INTRADERMAL

## 2018-11-13 MED ORDER — FENTANYL CITRATE (PF) 250 MCG/5ML IJ SOLN
INTRAMUSCULAR | Status: AC
  Filled 2018-11-13: qty 5

## 2018-11-13 MED ORDER — LACTATED RINGERS IV SOLN
INTRAVENOUS | Status: DC | PRN
  Administered 2018-11-13 (×2): via INTRAVENOUS

## 2018-11-13 MED ORDER — ROPIVACAINE HCL 5 MG/ML IJ SOLN
INTRAMUSCULAR | Status: DC | PRN
  Administered 2018-11-13: 30 mL

## 2018-11-13 MED ORDER — ONDANSETRON HCL 4 MG/2ML IJ SOLN
INTRAMUSCULAR | Status: DC | PRN
  Administered 2018-11-13: 4 mg via INTRAVENOUS

## 2018-11-13 MED ORDER — GABAPENTIN 100 MG PO CAPS
ORAL_CAPSULE | ORAL | Status: AC
  Administered 2018-11-13: 100 mg via ORAL
  Filled 2018-11-13: qty 1

## 2018-11-13 MED ORDER — ACETAMINOPHEN 500 MG PO TABS
1000.0000 mg | ORAL_TABLET | Freq: Four times a day (QID) | ORAL | Status: DC
  Administered 2018-11-13 – 2018-11-14 (×3): 1000 mg via ORAL
  Filled 2018-11-13 (×3): qty 2

## 2018-11-13 MED ORDER — SCOPOLAMINE 1 MG/3DAYS TD PT72
MEDICATED_PATCH | TRANSDERMAL | Status: DC | PRN
  Administered 2018-11-13: 1 via TRANSDERMAL

## 2018-11-13 MED ORDER — LIDOCAINE HCL (CARDIAC) PF 100 MG/5ML IV SOSY
PREFILLED_SYRINGE | INTRAVENOUS | Status: DC | PRN
  Administered 2018-11-13: 30 mg via INTRAVENOUS

## 2018-11-13 MED ORDER — DEXAMETHASONE SODIUM PHOSPHATE 10 MG/ML IJ SOLN
INTRAMUSCULAR | Status: DC | PRN
  Administered 2018-11-13: 10 mg via INTRAVENOUS

## 2018-11-13 MED ORDER — CEFAZOLIN SODIUM-DEXTROSE 2-4 GM/100ML-% IV SOLN
2.0000 g | INTRAVENOUS | Status: AC
  Administered 2018-11-13: 2 g via INTRAVENOUS

## 2018-11-13 MED ORDER — MORPHINE SULFATE (PF) 2 MG/ML IV SOLN: 1.0000 mg | INTRAVENOUS | Status: DC | PRN

## 2018-11-13 MED ORDER — ENOXAPARIN SODIUM 40 MG/0.4ML ~~LOC~~ SOLN: 40.0000 mg | SUBCUTANEOUS | Status: DC

## 2018-11-13 MED ORDER — ENSURE PRE-SURGERY PO LIQD
296.0000 mL | Freq: Once | ORAL | Status: DC
  Filled 2018-11-13: qty 296

## 2018-11-13 MED ORDER — GABAPENTIN 100 MG PO CAPS
100.0000 mg | ORAL_CAPSULE | ORAL | Status: AC
  Administered 2018-11-13: 100 mg via ORAL

## 2018-11-13 MED ORDER — METHOCARBAMOL 500 MG PO TABS: 500.0000 mg | ORAL_TABLET | Freq: Three times a day (TID) | ORAL | 0 refills | Status: DC | PRN

## 2018-11-13 MED ORDER — MIDAZOLAM HCL 5 MG/5ML IJ SOLN
INTRAMUSCULAR | Status: DC | PRN
  Administered 2018-11-13: 1 mg via INTRAVENOUS

## 2018-11-13 MED ORDER — PHENYLEPHRINE HCL 10 MG/ML IJ SOLN
INTRAMUSCULAR | Status: DC | PRN
  Administered 2018-11-13 (×2): 100 ug via INTRAVENOUS

## 2018-11-13 MED ORDER — PROPOFOL 10 MG/ML IV BOLUS: INTRAVENOUS | Status: DC | PRN

## 2018-11-13 MED ORDER — KETOROLAC TROMETHAMINE 15 MG/ML IJ SOLN
15.0000 mg | INTRAMUSCULAR | Status: AC
  Administered 2018-11-13: 15 mg via INTRAVENOUS

## 2018-11-13 MED ORDER — LIDOCAINE HCL (CARDIAC) PF 100 MG/5ML IV SOSY: PREFILLED_SYRINGE | INTRAVENOUS | Status: DC | PRN

## 2018-11-13 MED ORDER — HYDROCHLOROTHIAZIDE 12.5 MG PO CAPS: 12.5000 mg | ORAL_CAPSULE | Freq: Every day | ORAL | Status: DC

## 2018-11-13 MED ORDER — OXYCODONE HCL 5 MG PO TABS: 5.0000 mg | ORAL_TABLET | ORAL | Status: DC | PRN

## 2018-11-13 MED ORDER — OXYCODONE HCL 5 MG PO TABS: 5.0000 mg | ORAL_TABLET | ORAL | 0 refills | Status: DC | PRN

## 2018-11-13 MED ORDER — MIDAZOLAM HCL 2 MG/2ML IJ SOLN: 2.0000 mg | Freq: Once | INTRAMUSCULAR | Status: DC

## 2018-11-13 MED ORDER — FENTANYL CITRATE (PF) 100 MCG/2ML IJ SOLN: 50.0000 ug | Freq: Once | INTRAMUSCULAR | Status: DC

## 2018-11-13 MED ORDER — MIDAZOLAM HCL 2 MG/2ML IJ SOLN
INTRAMUSCULAR | Status: AC
  Administered 2018-11-13: 2 mg
  Filled 2018-11-13: qty 2

## 2018-11-13 MED ORDER — ONDANSETRON HCL 4 MG/2ML IJ SOLN: 4.0000 mg | Freq: Four times a day (QID) | INTRAMUSCULAR | Status: DC | PRN

## 2018-11-13 MED ORDER — SULFAMETHOXAZOLE-TRIMETHOPRIM 800-160 MG PO TABS: 1.0000 | ORAL_TABLET | Freq: Two times a day (BID) | ORAL | 0 refills | Status: DC

## 2018-11-13 MED ORDER — CEFAZOLIN SODIUM-DEXTROSE 1-4 GM/50ML-% IV SOLN
1.0000 g | Freq: Three times a day (TID) | INTRAVENOUS | Status: DC
  Administered 2018-11-13 – 2018-11-14 (×2): 1 g via INTRAVENOUS
  Filled 2018-11-13 (×4): qty 50

## 2018-11-13 MED ORDER — SIMETHICONE 80 MG PO CHEW: 40.0000 mg | CHEWABLE_TABLET | Freq: Four times a day (QID) | ORAL | Status: DC | PRN

## 2018-11-13 MED ORDER — LACTATED RINGERS IV SOLN
INTRAVENOUS | Status: DC
  Administered 2018-11-13: 12:00:00 via INTRAVENOUS

## 2018-11-13 MED ORDER — SODIUM CHLORIDE 0.9 % IV SOLN
INTRAVENOUS | Status: DC
  Administered 2018-11-13: 18:00:00 via INTRAVENOUS

## 2018-11-13 MED ORDER — ACETAMINOPHEN 500 MG PO TABS
ORAL_TABLET | ORAL | Status: AC
  Administered 2018-11-13: 1000 mg via ORAL
  Filled 2018-11-13: qty 2

## 2018-11-13 MED ORDER — FENTANYL CITRATE (PF) 100 MCG/2ML IJ SOLN
INTRAMUSCULAR | Status: AC
  Administered 2018-11-13: 50 ug
  Filled 2018-11-13: qty 2

## 2018-11-13 SURGICAL SUPPLY — 89 items
ALLODERM 16X20 PERFORATED (Tissue) ×1 IMPLANT
ALLOGRAFT PERF 16X20 1.6+/-0.4 (Tissue) ×1 IMPLANT
APPLIER CLIP 9.375 MED OPEN (MISCELLANEOUS) ×4
BAG DECANTER FOR FLEXI CONT (MISCELLANEOUS) ×4 IMPLANT
BINDER BREAST LRG (GAUZE/BANDAGES/DRESSINGS) IMPLANT
BINDER BREAST XLRG (GAUZE/BANDAGES/DRESSINGS) IMPLANT
CANISTER SUCT 3000ML PPV (MISCELLANEOUS) ×8 IMPLANT
CHLORAPREP W/TINT 26ML (MISCELLANEOUS) ×8 IMPLANT
CLIP APPLIE 9.375 MED OPEN (MISCELLANEOUS) IMPLANT
CLOSURE WOUND 1/2 X4 (GAUZE/BANDAGES/DRESSINGS)
CONT SPEC 4OZ CLIKSEAL STRL BL (MISCELLANEOUS) ×4 IMPLANT
COVER PROBE W GEL 5X96 (DRAPES) ×4 IMPLANT
COVER SURGICAL LIGHT HANDLE (MISCELLANEOUS) ×8 IMPLANT
COVER WAND RF STERILE (DRAPES) ×4 IMPLANT
DERMABOND ADVANCED (GAUZE/BANDAGES/DRESSINGS)
DERMABOND ADVANCED .7 DNX12 (GAUZE/BANDAGES/DRESSINGS) ×4 IMPLANT
DRAIN CHANNEL 15F RND FF W/TCR (WOUND CARE) ×2 IMPLANT
DRAIN CHANNEL 19F RND (DRAIN) ×4 IMPLANT
DRAPE HALF SHEET 40X57 (DRAPES) ×4 IMPLANT
DRAPE LAPAROSCOPIC ABDOMINAL (DRAPES) ×4 IMPLANT
DRAPE ORTHO SPLIT 77X108 STRL (DRAPES) ×4
DRAPE SURG ORHT 6 SPLT 77X108 (DRAPES) ×4 IMPLANT
DRAPE WARM FLUID 44X44 (DRAPE) ×4 IMPLANT
DRSG PAD ABDOMINAL 8X10 ST (GAUZE/BANDAGES/DRESSINGS) ×8 IMPLANT
DRSG TEGADERM 4X4.75 (GAUZE/BANDAGES/DRESSINGS) ×16 IMPLANT
ELECT BLADE 4.0 EZ CLEAN MEGAD (MISCELLANEOUS) ×8
ELECT BLADE 6.5 EXT (BLADE) ×4 IMPLANT
ELECT CAUTERY BLADE 6.4 (BLADE) ×8 IMPLANT
ELECT COATED BLADE 2.86 ST (ELECTRODE) ×4 IMPLANT
ELECT REM PT RETURN 9FT ADLT (ELECTROSURGICAL) ×8
ELECTRODE BLDE 4.0 EZ CLN MEGD (MISCELLANEOUS) ×4 IMPLANT
ELECTRODE REM PT RTRN 9FT ADLT (ELECTROSURGICAL) ×4 IMPLANT
EVACUATOR SILICONE 100CC (DRAIN) ×8 IMPLANT
EXPANDER TISSUE FORTE 300CC (Breast) IMPLANT
GAUZE SPONGE 4X4 12PLY STRL (GAUZE/BANDAGES/DRESSINGS) ×4 IMPLANT
GLOVE BIO SURGEON STRL SZ 6 (GLOVE) ×12 IMPLANT
GLOVE BIO SURGEON STRL SZ7 (GLOVE) ×6 IMPLANT
GLOVE BIOGEL PI IND STRL 7.5 (GLOVE) ×2 IMPLANT
GLOVE BIOGEL PI INDICATOR 7.5 (GLOVE) ×2
GOWN STRL REUS W/ TWL LRG LVL3 (GOWN DISPOSABLE) ×10 IMPLANT
GOWN STRL REUS W/TWL LRG LVL3 (GOWN DISPOSABLE) ×8
KIT BASIN OR (CUSTOM PROCEDURE TRAY) ×8 IMPLANT
KIT TURNOVER KIT B (KITS) ×8 IMPLANT
LIGHT WAVEGUIDE WIDE FLAT (MISCELLANEOUS) ×2 IMPLANT
MARKER SKIN DUAL TIP RULER LAB (MISCELLANEOUS) ×8 IMPLANT
NDL 18GX1X1/2 (RX/OR ONLY) (NEEDLE) IMPLANT
NDL FILTER BLUNT 18X1 1/2 (NEEDLE) IMPLANT
NDL HYPO 25GX1X1/2 BEV (NEEDLE) IMPLANT
NEEDLE 18GX1X1/2 (RX/OR ONLY) (NEEDLE) IMPLANT
NEEDLE FILTER BLUNT 18X 1/2SAF (NEEDLE)
NEEDLE FILTER BLUNT 18X1 1/2 (NEEDLE) IMPLANT
NEEDLE HYPO 25GX1X1/2 BEV (NEEDLE) IMPLANT
NS IRRIG 1000ML POUR BTL (IV SOLUTION) ×12 IMPLANT
PACK GENERAL/GYN (CUSTOM PROCEDURE TRAY) ×8 IMPLANT
PAD ARMBOARD 7.5X6 YLW CONV (MISCELLANEOUS) ×8 IMPLANT
PENCIL SMOKE EVACUATOR (MISCELLANEOUS) ×4 IMPLANT
PIN SAFETY STERILE (MISCELLANEOUS) ×8 IMPLANT
PUNCH BIOPSY 4MM (MISCELLANEOUS)
PUNCH BIOPSY DERMAL 6MM STRL (MISCELLANEOUS) IMPLANT
PUNCH BIOPSY DISP 4 (MISCELLANEOUS) IMPLANT
SET ASEPTIC TRANSFER (MISCELLANEOUS) ×4 IMPLANT
SLEEVE SUCTION CATH 165 (SLEEVE) ×2 IMPLANT
SOL PREP POV-IOD 4OZ 10% (MISCELLANEOUS) ×4 IMPLANT
SPECIMEN JAR X LARGE (MISCELLANEOUS) ×4 IMPLANT
STAPLER VISISTAT 35W (STAPLE) ×8 IMPLANT
STRIP CLOSURE SKIN 1/2X4 (GAUZE/BANDAGES/DRESSINGS) ×2 IMPLANT
SUT CHROMIC 4 0 PS 2 18 (SUTURE) ×12 IMPLANT
SUT ETHILON 2 0 FS 18 (SUTURE) ×4 IMPLANT
SUT MNCRL AB 4-0 PS2 18 (SUTURE) ×6 IMPLANT
SUT SILK 2 0 SH (SUTURE) ×2 IMPLANT
SUT VIC AB 0 CT2 27 (SUTURE) ×6 IMPLANT
SUT VIC AB 2-0 SH 18 (SUTURE) ×2 IMPLANT
SUT VIC AB 2-0 SH 27 (SUTURE) ×2
SUT VIC AB 2-0 SH 27X BRD (SUTURE) IMPLANT
SUT VIC AB 3-0 54X BRD REEL (SUTURE) ×2 IMPLANT
SUT VIC AB 3-0 BRD 54 (SUTURE) ×2
SUT VIC AB 3-0 SH 18 (SUTURE) ×4 IMPLANT
SUT VIC AB 3-0 SH 27 (SUTURE) ×2
SUT VIC AB 3-0 SH 27X BRD (SUTURE) IMPLANT
SUT VICRYL 4-0 PS2 18IN ABS (SUTURE) ×2 IMPLANT
SUT VLOC 180 0 24IN GS25 (SUTURE) ×2 IMPLANT
SYR BULB IRRIGATION 50ML (SYRINGE) ×4 IMPLANT
SYR CONTROL 10ML LL (SYRINGE) IMPLANT
TISSUE EXPANDER FORTE 300CC (Breast) ×4 IMPLANT
TOWEL OR 17X24 6PK STRL BLUE (TOWEL DISPOSABLE) ×8 IMPLANT
TOWEL OR 17X26 10 PK STRL BLUE (TOWEL DISPOSABLE) ×8 IMPLANT
TRAY FOLEY MTR SLVR 16FR STAT (SET/KITS/TRAYS/PACK) IMPLANT
TUBE CONNECTING 12'X1/4 (SUCTIONS) ×1
TUBE CONNECTING 12X1/4 (SUCTIONS) ×3 IMPLANT

## 2018-11-13 NOTE — Transfer of Care (Signed)
Immediate Anesthesia Transfer of Care Note  Patient: Heather [name]  Procedure(s) Performed: LEFT NIPPLE SPARING MASTECTOMY WITH LEFT AXILLARY SENTINEL LYMPH NODE BIOPSY (Left Breast) LEFT BREAST RECONSTRUCTION WITH PLACEMENT OF TISSUE EXPANDER AND ALLODERM (Left Chest)  Patient Location: PACU  Anesthesia Type:GA combined with regional for post-op pain  Level of Consciousness: awake, alert  and drowsy  Airway & Oxygen Therapy: Patient Spontanous Breathing  Post-op Assessment: Report given to RN and Post -op Vital signs reviewed and stable  Post vital signs: Reviewed and stable  Last Vitals:  Vitals Value Taken Time  BP 126/62 11/13/2018  3:48 PM  Temp    Pulse 79 11/13/2018  3:51 PM  Resp 24 11/13/2018  3:51 PM  SpO2 100 % 11/13/2018  3:51 PM  Vitals shown include unvalidated device data.  Last Pain:  Vitals:   11/13/18 1123  TempSrc:   PainSc: 0-No pain         Complications: No apparent anesthesia complications

## 2018-11-13 NOTE — Anesthesia Procedure Notes (Signed)
Procedure Name: Intubation Date/Time: 11/13/2018 1:25 PM Performed by: Eligha Bridegroom, CRNA Pre-anesthesia Checklist: Patient identified, Emergency Drugs available, Suction available, Patient being monitored and Timeout performed Patient Re-evaluated:Patient Re-evaluated prior to induction Oxygen Delivery Method: Circle system utilized Preoxygenation: Pre-oxygenation with 100% oxygen Induction Type: IV induction Ventilation: Mask ventilation without difficulty Laryngoscope Size: Miller and 3 Grade View: Grade III Tube type: Oral Tube size: 7.0 mm Number of attempts: 1 Airway Equipment and Method: Stylet Placement Confirmation: ETT inserted through vocal cords under direct vision Secured at: 22 cm Tube secured with: Tape Dental Injury: Teeth and Oropharynx as per pre-operative assessment

## 2018-11-13 NOTE — Discharge Instructions (Signed)
[facility] [phone]   POST OP INSTRUCTIONS Take 400 mg of ibuprofen every 8 hours or 650 mg tylenol every 6 hours for next 72 hours then as needed. Use ice several times daily also. Always review your discharge instruction sheet given to you by the facility where your surgery was performed. IF YOU HAVE DISABILITY OR FAMILY LEAVE FORMS, YOU MUST BRING THEM TO THE OFFICE FOR PROCESSING.   DO NOT GIVE THEM TO YOUR DOCTOR. A prescription for pain medication may be given to you upon discharge.  Take your pain medication as prescribed, if needed.  If narcotic pain medicine is not needed, then you may take acetaminophen (Tylenol), naprosyn (Alleve) or ibuprofen (Advil) as needed. 1. Take your usually prescribed medications unless otherwise directed. 2. If you need a refill on your pain medication, please contact your pharmacy.  They will contact our office to request authorization.  Prescriptions will not be filled after 5pm or on week-ends. 3. You should follow a light diet the first few days after arrival home, such as soup and crackers, etc.  Resume your normal diet the day after surgery. 4. Most patients will experience some swelling and bruising on the chest and underarm.  Ice packs will help.  Swelling and bruising can take several days to resolve. Wear the binder day and night until you return to the office.  5. It is common to experience some constipation if taking pain medication after surgery.  Increasing fluid intake and taking a stool softener (such as Colace) will usually help or prevent this problem from occurring.  A mild laxative (Milk of Magnesia or Miralax) should be taken according to package instructions if there are no bowel movements after 48 hours. 6. Unless discharge instructions indicate otherwise, leave your bandage dry and in place until your next appointment in 3-5 days.  You may take a limited sponge bath.  No tube baths or showers until the drains are  removed.  You may have steri-strips (small skin tapes) in place directly over the incision.  These strips should be left on the skin for 7-10 days. If you have glue it will come off in next couple week.  Any sutures will be removed at an office visit 7. DRAINS:  If you have drains in place, it is important to keep a list of the amount of drainage produced each day in your drains.  Before leaving the hospital, you should be instructed on drain care.  Call our office if you have any questions about your drains. I will remove your drains when they put out less than 30 cc or ml for 2 consecutive days. 8. ACTIVITIES:  You may resume regular (light) daily activities beginning the next day--such as daily self-care, walking, climbing stairs--gradually increasing activities as tolerated.  You may have sexual intercourse when it is comfortable.  Refrain from any heavy lifting or straining until approved by your doctor. a. You may drive when you are no longer taking prescription pain medication, you can comfortably wear a seatbelt, and you can safely maneuver your car and apply brakes. b. RETURN TO WORK:  __________________________________________________________ 9. You should see your doctor in the office for a follow-up appointment approximately 3-5 days after your surgery.  Your doctor's nurse will typically make your follow-up appointment when she calls you with your pathology report.  Expect your pathology report 3-4business days after surgery. 10. OTHER INSTRUCTIONS: ______________________________________________________________________________________________ ____________________________________________________________________________________________ WHEN TO CALL YOUR DR Arnold Kester: 1. Fever over 101.0 2.   Nausea and/or vomiting 3. Extreme swelling or bruising 4. Continued bleeding from incision. 5. Increased pain, redness, or drainage from the incision. The clinic staff is available to answer your questions  during regular business hours.  Please don't hesitate to call and ask to speak to one of the nurses for clinical concerns.  If you have a medical emergency, go to the nearest emergency room or call 911.  A surgeon from [facility] is always on call at the hospital. [address], [address], [place], [place]  [address] ? [address] [address], [place], [place]   [address] [phone] ? [phone] ? FAX [phone] Web site: [email or web address]  

## 2018-11-13 NOTE — Op Note (Signed)
Preoperative diagnosis: clinical stage II left breast cancer Postoperative diagnosis: same as above Procedure: 1. Left nipple sparing mastectomy 2. Left deep axillary sentinel node biopsy Surgeon: Dr Serita Grammes Asst: Dr Irene Limbo EBL: 100 cc Drains per plastic surgery Specimens: 1. Left mastectomy short superior, long lateral double nipple/areola 2. Left nipple specimen 3. Left axillary sentinel nodes with highest count [id number] Complications none dispo case turned over to plastic surgery for completion.  Indications: This is a 69 yof who was noted to have a left breast mass.  There is larger area of calcifications measuring almost 6 cm with a 1.6 cm mass associated with it.  She has 3 abnl nodes with one biopsied that is negative.  The two breast biopsies are about 6 cm apart and are grade II IDC that is er/pr pos, her 2 negative and Ki is 30%.  We discussed options and have elected to proceed with nsm with sn biopsy and immediate prepec expander reconstruction.  Procedure: After informed consent obtained patient was first injected with technetium in standard periareolar fashion. She underwent a pectoral block. She was given antibiotics. She was then placed under general anesthesia without complication. She was prepped and draped in standard sterile surgical fashion. A timeout was performed.  I measured out an incision 7 cm from xyphoid in inframammary fold measuring about 9 cm.  I then used the cautery to remove the breast and the pectoralis fascia from the muscle.I did this to the clavicle, parasternal region and the latissimus laterally.  I then created the anterior flap and removed the breast from the skin. This is very thin as she had no subcutaneous fat. The tumor was palpable and there is nothing but skin overlying this now.  The breast was then removed and marked as above.  I then removed the undersurface of the nipple and sent as a separate specimen.  I packed this after  obtaining hemostasis.  I then located the sentinel nodes. I made an incision below the hairline and went through the fascia. There were several mildly enlarged nodes that I removed with the highest count as above.  There was minimal background radioactivity.  I obtained hemostasis.  I closed the fascia with 2-0 vicryl. The skin was closed with 3-0 vicryl and 4-0 monocryl. dermabond was placed.  I then turned the case over to plastic surgery.

## 2018-11-13 NOTE — Interval H&P Note (Signed)
History and Physical Interval Note:  11/13/2018 11:49 AM  Heather [name]  has presented today for surgery, with the diagnosis of LEFT BREAST CANCER  The various methods of treatment have been discussed with the patient and family. After consideration of risks, benefits and other options for treatment, the patient has consented to  Procedure(s): LEFT NIPPLE SPARING MASTECTOMY WITH LEFT AXILLARY SENTINEL LYMPH NODE BIOPSY (Left) LEFT BREAST RECONSTRUCTION WITH PLACEMENT OF TISSUE EXPANDER AND ALLODERM (Left) as a surgical intervention .  The patient's history has been reviewed, patient examined, no change in status, stable for surgery.  I have reviewed the patient's chart and labs.  Questions were answered to the patient's satisfaction.     Arnoldo Hooker [name]

## 2018-11-13 NOTE — Interval H&P Note (Signed)
History and Physical Interval Note:  11/13/2018 12:52 PM  Heather [name]  has presented today for surgery, with the diagnosis of LEFT BREAST CANCER  The various methods of treatment have been discussed with the patient and family. After consideration of risks, benefits and other options for treatment, the patient has consented to  Procedure(s): LEFT NIPPLE SPARING MASTECTOMY WITH LEFT AXILLARY SENTINEL LYMPH NODE BIOPSY (Left) LEFT BREAST RECONSTRUCTION WITH PLACEMENT OF TISSUE EXPANDER AND ALLODERM (Left) as a surgical intervention .  The patient's history has been reviewed, patient examined, no change in status, stable for surgery.  I have reviewed the patient's chart and labs.  Questions were answered to the patient's satisfaction.     Rolm Bookbinder

## 2018-11-13 NOTE — Anesthesia Procedure Notes (Signed)
Anesthesia Regional Block: Pectoralis block   Pre-Anesthetic Checklist: ,, timeout performed, Correct Patient, Correct Site, Correct Laterality, Correct Procedure, Correct Position, site marked, Risks and benefits discussed,  Surgical consent,  Pre-op evaluation,  At surgeon's request and post-op pain management  Laterality: Left  Prep: chloraprep       Needles:  Injection technique: Single-shot  Needle Type: Echogenic Needle     Needle Length: 9cm  Needle Gauge: 21     Additional Needles:   Procedures:,,,, ultrasound used (permanent image in chart),,,,  Narrative:  Start time: 11/13/2018 12:02 PM End time: 11/13/2018 12:13 PM Injection made incrementally with aspirations every 5 mL.  Performed by: Personally  Anesthesiologist: Barnet Glasgow, MD  Additional Notes: Block assessed. Patient tolerated procedure well.

## 2018-11-13 NOTE — Anesthesia Postprocedure Evaluation (Signed)
Anesthesia Post Note  Patient: Heather [name]  Procedure(s) Performed: LEFT NIPPLE SPARING MASTECTOMY WITH LEFT AXILLARY SENTINEL LYMPH NODE BIOPSY (Left Breast) LEFT BREAST RECONSTRUCTION WITH PLACEMENT OF TISSUE EXPANDER AND ALLODERM (Left Chest)     Patient location during evaluation: PACU Anesthesia Type: Regional and General Level of consciousness: awake and alert Pain management: pain level controlled Vital Signs Assessment: post-procedure vital signs reviewed and stable Respiratory status: spontaneous breathing, nonlabored ventilation, respiratory function stable and patient connected to nasal cannula oxygen Cardiovascular status: blood pressure returned to baseline and stable Postop Assessment: no apparent nausea or vomiting Anesthetic complications: no    Last Vitals:  Vitals:   11/13/18 1605 11/13/18 1610  BP: 129/73   Pulse: 71 71  Resp: 17 16  Temp:    SpO2: 100% 100%    Last Pain:  Vitals:   11/13/18 1610  TempSrc:   PainSc: 0-No pain                 Barnet Glasgow

## 2018-11-13 NOTE — H&P (View-Only) (Signed)
58 yof referred by Dr Sheppard Coil for new left breast cancer. she has a palpable left breast mass without discharge. she has fh of maternal aunt and a maternal cousin with breast cancer. she has dense breasts and was noted to have large area of calcifications measuring 5.9x3.6x4.4 cm with a mass that measures 1.6x1.5x1 cm at one end. there are multiple other smaller masses as well. she has 3 abnl nodes with one being biopsied. the clips from two biopsies are 4 cm apart in the breast but the total extent is about 6 cm. the biopsy of the node is benign and discordant. 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 the biopsy of the other extent of this is dcis. she is here to discuss options. she is otherwise healthy. she works at [employer] and lives in [place]. she quit smoking over the summer.   Past Surgical History Breast Biopsy  Left. multiple Cesarean Section - 1   Diagnostic Studies History  Colonoscopy  never Mammogram  within last year Pap Smear  1-5 years ago  Medication History Medications Reconciled  Social History  Alcohol use  Occasional alcohol use. Caffeine use  Coffee. No drug use  Tobacco use  Former smoker.  Family History  Arthritis  Mother. Diabetes Mellitus  Mother. Heart Disease  Father. Heart disease in female family member before age 71  Hypertension  Mother. Prostate Cancer  Brother.  Pregnancy / Birth History Age at menarche  58 years. Age of menopause  59-55 Gravida  3 Irregular periods  Maternal age  13-35 Para  1  Other Problems Breast Cancer  Hemorrhoids  High blood pressure  Lump In Breast    Review of Systems  General Not Present- Appetite Loss, Chills, Fatigue, Fever, Night Sweats, Weight Gain and Weight Loss. Skin Not Present- Change in Wart/Mole, Dryness, Hives, Jaundice, New Lesions, Non-Healing Wounds, Rash and Ulcer. HEENT Not Present- Earache, Hearing  Loss, Hoarseness, Nose Bleed, Oral Ulcers, Ringing in the Ears, Seasonal Allergies, Sinus Pain, Sore Throat, Visual Disturbances, Wears glasses/contact lenses and Yellow Eyes. Respiratory Not Present- Bloody sputum, Chronic Cough, Difficulty Breathing, Snoring and Wheezing. Breast Not Present- Breast Mass, Breast Pain, Nipple Discharge and Skin Changes. Cardiovascular Not Present- Chest Pain, Difficulty Breathing Lying Down, Leg Cramps, Palpitations, Rapid Heart Rate, Shortness of Breath and Swelling of Extremities. Gastrointestinal Not Present- Abdominal Pain, Bloating, Bloody Stool, Change in Bowel Habits, Chronic diarrhea, Constipation, Difficulty Swallowing, Excessive gas, Gets full quickly at meals, Hemorrhoids, Indigestion, Nausea, Rectal Pain and Vomiting. Female Genitourinary Not Present- Frequency, Nocturia, Painful Urination, Pelvic Pain and Urgency. Musculoskeletal Not Present- Back Pain, Joint Pain, Joint Stiffness, Muscle Pain, Muscle Weakness and Swelling of Extremities. Neurological Not Present- Decreased Memory, Fainting, Headaches, Numbness, Seizures, Tingling, Tremor, Trouble walking and Weakness. Psychiatric Not Present- Anxiety, Bipolar, Change in Sleep Pattern, Depression, Fearful and Frequent crying. Endocrine Not Present- Cold Intolerance, Excessive Hunger, Hair Changes, Heat Intolerance, Hot flashes and New Diabetes. Hematology Present- Easy Bruising. Not Present- Blood Thinners, Excessive bleeding, Gland problems, HIV and Persistent Infections.   Physical Exam  General Mental Status-Alert. Head and Neck Trachea-midline. Thyroid Gland Characteristics - normal size and consistency. Eye Sclera/Conjunctiva - Bilateral-No scleral icterus. Chest and Lung Exam Chest and lung exam reveals -quiet, even and easy respiratory effort with no use of accessory muscles and on auscultation, normal breath sounds, no adventitious sounds and normal vocal  resonance. Breast Nipples-No Discharge.  Breast Lump-No Palpable Breast Mass. Note: left breast hematoma difficult to discern mass now Cardioscular Cardiovascular examination reveals -normal heart sounds, regular rate and rhythm with no murmurs. Abdomen Note: soft no hm Neurologic Neurologic evaluation reveals -alert and oriented x 3 with no impairment of recent or remote memory. Lymphatic Head & Neck General Head & Neck Lymphatics: Bilateral - Description - Normal. Axillary General Axillary Region: Bilateral - Description - Normal. Note: no [place] adenopathy   Assessment & Plan Rolm Bookbinder MD; 09/20/2018 10:54 AM) BREAST CANCER OF UPPER-OUTER QUADRANT OF LEFT FEMALE BREAST (C50.412) Story: Left nsm with reconstruction, plastics appt, will do sometime in next couple months for variety of reasons including recent death in her family. will place on AI and see back after plastics appt We discussed the staging and pathophysiology of breast cancer. We discussed all of the different options for treatment for breast cancer including surgery, chemotherapy, radiation therapy, Herceptin, and antiestrogen therapy. We discussed a sentinel lymph node biopsy as she does not appear to having lymph node involvement right now although this is suspicious now. I will put a seed in the biopsied node and then do sn biopsy and await those results. We discussed the performance of that with injection of radioactive tracer. We discussed that there is a chance of having a positive node with a sentinel lymph node biopsy and we will await the permanent pathology to make any other first further decisions in terms of her treatment. One of these options might be to return to the operating room to perform an axillary lymph node dissection. We discussed up to a 5% risk lifetime of chronic shoulder pain as well as lymphedema associated with a sentinel lymph node biopsy. We discussed the options for treatment of  the breast cancer which included lumpectomy versus a mastectomy. I dont think she can undergo lumpectomy with breast size and the extent of disease. we discussed mastectomy and nsm. we discussed reconstruction. she has stopped smoking. will tentatively plan for nsm with reconstruction and she will see plastic surgery.

## 2018-11-13 NOTE — Op Note (Signed)
Operative Note   DATE OF OPERATION: 2.11.20  LOCATION: [facility]-observation  SURGICAL DIVISION: Plastic Surgery  PREOPERATIVE DIAGNOSES:  Left breast cancer UOQ ER+  POSTOPERATIVE DIAGNOSES:  same  PROCEDURE:  1. Left breast reconstruction with tissue expander 2. Acellular dermis (Alloderm) for breast reconstruction 300 cm2  SURGEON: Irene Limbo MD MBA  ASSISTANT: L Smail RNFA student  ANESTHESIA:  General.   EBL: 100 ml for entire procedure  COMPLICATIONS: None immediate.   INDICATIONS FOR PROCEDURE:  The patient, Heather [name], is a 58 y.o. female born on [birth year], is here for immediate prepectoral expander based reconstruction following nipple sparing reconstruction.   FINDINGS: Natrelle 133S FV-11-300 T tissue expander placed, initial fill volume 180 ml air. SN [id number]  DESCRIPTION OF PROCEDURE:  The patient was marked with the patient in the preoperative area to mark sternal notch, chest midline, anterior axillary lines and inframammary folds. The patient's operative site was prepped and draped in a sterile fashion. A time out was performed and all information was confirmed to be correct.   Following completion of mastectomy, cavity was irrigated with solution containing Ancef, gentamicin, and bacitracin. Hemostasis was ensured. Laterally the mastectomy flap over posterior axillary line was advanced anteriorly and the subcutaneous tissue and superficial fascia was secured to pectoralis muscle and acellular dermis with 0-vicryl.  A 19 Fr drain was placed in subcutaneous position laterally and a 15 Fr drain placed along inframammary fold. Each secured to skin with 2-0 nylon. Cavity irrigated with Betadine. The tissue expanders were prepared on back table prior in insertion. The expander was filled with air to 180 ml air.Perforated acellular dermis wasdraped over anterior surface expander. The ADM was then secured to itself over posterior surface of expander with 4-0  chromic. Redundant folds acellular dermis excised so that the ADM lay flat without folds over air filled expander.The expander was secured tofascia over lateral sternal borderwith a 0 vicryl. Thelateral tab wasalso secured to pectoralis muscle with 0-vicryl. The ADM was secured to pectoralis muscle and chest wall along inferior border at inframammary foldwith 0 V-lock suture.Skin closure completedwith 3-0 vicryl in fascial layer and 4-0 vicryl in dermis. Skin closure completed with 4-0 monocryl subcuticular and tissue adhesive.  The mastectomy flap was redraped so that NAC was symmetric from midline and sternal notch with right breast. Tegaderm dressings applied followed bydry dressing,breast binder.  The patient was allowed to wake from anesthesia, extubated and taken to the recovery room in satisfactory condition.   SPECIMENS: none  DRAINS: 15 and 19 Fr JP in left breast reconstruction  Irene Limbo, MD [facility] Plastic & Reconstructive Surgery [phone], pin [phone]

## 2018-11-13 NOTE — H&P (Signed)
58 yof referred by Dr Sheppard Coil for new left breast cancer. she has a palpable left breast mass without discharge. she has fh of maternal aunt and a maternal cousin with breast cancer. she has dense breasts and was noted to have large area of calcifications measuring 5.9x3.6x4.4 cm with a mass that measures 1.6x1.5x1 cm at one end. there are multiple other smaller masses as well. she has 3 abnl nodes with one being biopsied. the clips from two biopsies are 4 cm apart in the breast but the total extent is about 6 cm. the biopsy of the node is benign and discordant. 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 the biopsy of the other extent of this is dcis. she is here to discuss options. she is otherwise healthy. she works at [employer] and lives in [place]. she quit smoking over the summer.   Past Surgical History Breast Biopsy  Left. multiple Cesarean Section - 1   Diagnostic Studies History  Colonoscopy  never Mammogram  within last year Pap Smear  1-5 years ago  Medication History Medications Reconciled  Social History  Alcohol use  Occasional alcohol use. Caffeine use  Coffee. No drug use  Tobacco use  Former smoker.  Family History  Arthritis  Mother. Diabetes Mellitus  Mother. Heart Disease  Father. Heart disease in female family member before age 65  Hypertension  Mother. Prostate Cancer  Brother.  Pregnancy / Birth History Age at menarche  29 years. Age of menopause  71-55 Gravida  3 Irregular periods  Maternal age  46-35 Para  1  Other Problems Breast Cancer  Hemorrhoids  High blood pressure  Lump In Breast    Review of Systems  General Not Present- Appetite Loss, Chills, Fatigue, Fever, Night Sweats, Weight Gain and Weight Loss. Skin Not Present- Change in Wart/Mole, Dryness, Hives, Jaundice, New Lesions, Non-Healing Wounds, Rash and Ulcer. HEENT Not Present- Earache, Hearing  Loss, Hoarseness, Nose Bleed, Oral Ulcers, Ringing in the Ears, Seasonal Allergies, Sinus Pain, Sore Throat, Visual Disturbances, Wears glasses/contact lenses and Yellow Eyes. Respiratory Not Present- Bloody sputum, Chronic Cough, Difficulty Breathing, Snoring and Wheezing. Breast Not Present- Breast Mass, Breast Pain, Nipple Discharge and Skin Changes. Cardiovascular Not Present- Chest Pain, Difficulty Breathing Lying Down, Leg Cramps, Palpitations, Rapid Heart Rate, Shortness of Breath and Swelling of Extremities. Gastrointestinal Not Present- Abdominal Pain, Bloating, Bloody Stool, Change in Bowel Habits, Chronic diarrhea, Constipation, Difficulty Swallowing, Excessive gas, Gets full quickly at meals, Hemorrhoids, Indigestion, Nausea, Rectal Pain and Vomiting. Female Genitourinary Not Present- Frequency, Nocturia, Painful Urination, Pelvic Pain and Urgency. Musculoskeletal Not Present- Back Pain, Joint Pain, Joint Stiffness, Muscle Pain, Muscle Weakness and Swelling of Extremities. Neurological Not Present- Decreased Memory, Fainting, Headaches, Numbness, Seizures, Tingling, Tremor, Trouble walking and Weakness. Psychiatric Not Present- Anxiety, Bipolar, Change in Sleep Pattern, Depression, Fearful and Frequent crying. Endocrine Not Present- Cold Intolerance, Excessive Hunger, Hair Changes, Heat Intolerance, Hot flashes and New Diabetes. Hematology Present- Easy Bruising. Not Present- Blood Thinners, Excessive bleeding, Gland problems, HIV and Persistent Infections.   Physical Exam  General Mental Status-Alert. Head and Neck Trachea-midline. Thyroid Gland Characteristics - normal size and consistency. Eye Sclera/Conjunctiva - Bilateral-No scleral icterus. Chest and Lung Exam Chest and lung exam reveals -quiet, even and easy respiratory effort with no use of accessory muscles and on auscultation, normal breath sounds, no adventitious sounds and normal vocal  resonance. Breast Nipples-No Discharge.  Breast Lump-No Palpable Breast Mass. Note: left breast hematoma difficult to discern mass now Cardioscular Cardiovascular examination reveals -normal heart sounds, regular rate and rhythm with no murmurs. Abdomen Note: soft no hm Neurologic Neurologic evaluation reveals -alert and oriented x 3 with no impairment of recent or remote memory. Lymphatic Head & Neck General Head & Neck Lymphatics: Bilateral - Description - Normal. Axillary General Axillary Region: Bilateral - Description - Normal. Note: no  adenopathy   Assessment & Plan Rolm Bookbinder MD; 09/20/2018 10:54 AM) BREAST CANCER OF UPPER-OUTER QUADRANT OF LEFT FEMALE BREAST (C50.412) Story: Left nsm with reconstruction, plastics appt, will do sometime in next couple months for variety of reasons including recent death in her family. will place on AI and see back after plastics appt We discussed the staging and pathophysiology of breast cancer. We discussed all of the different options for treatment for breast cancer including surgery, chemotherapy, radiation therapy, Herceptin, and antiestrogen therapy. We discussed a sentinel lymph node biopsy as she does not appear to having lymph node involvement right now although this is suspicious now. I will put a seed in the biopsied node and then do sn biopsy and await those results. We discussed the performance of that with injection of radioactive tracer. We discussed that there is a chance of having a positive node with a sentinel lymph node biopsy and we will await the permanent pathology to make any other first further decisions in terms of her treatment. One of these options might be to return to the operating room to perform an axillary lymph node dissection. We discussed up to a 5% risk lifetime of chronic shoulder pain as well as lymphedema associated with a sentinel lymph node biopsy. We discussed the options for treatment of  the breast cancer which included lumpectomy versus a mastectomy. I dont think she can undergo lumpectomy with breast size and the extent of disease. we discussed mastectomy and nsm. we discussed reconstruction. she has stopped smoking. will tentatively plan for nsm with reconstruction and she will see plastic surgery.

## 2018-11-13 NOTE — Anesthesia Preprocedure Evaluation (Addendum)
Anesthesia Evaluation  Patient identified by MRN, date of birth, ID band Patient awake    Reviewed: Allergy & Precautions, NPO status , Patient's Chart, lab work & pertinent test results  Airway Mallampati: II  TM Distance: >3 FB Neck ROM: Full    Dental no notable dental hx. (+) Teeth Intact, Dental Advisory Given   Pulmonary neg pulmonary ROS, former smoker,    Pulmonary exam normal breath sounds clear to auscultation       Cardiovascular Exercise Tolerance: Good hypertension, Pt. on medications Normal cardiovascular exam Rhythm:Regular Rate:Normal     Neuro/Psych negative neurological ROS  negative psych ROS   GI/Hepatic negative GI ROS, Neg liver ROS,   Endo/Other  negative endocrine ROS  Renal/GU negative Renal ROS     Musculoskeletal negative musculoskeletal ROS (+)   Abdominal   Peds  Hematology   Anesthesia Other Findings   Reproductive/Obstetrics                            Anesthesia Physical Anesthesia Plan  ASA: II  Anesthesia Plan: General   Post-op Pain Management:    Induction: Intravenous  PONV Risk Score and Plan: 3 and Treatment may vary due to age or medical condition, Ondansetron, Dexamethasone, Scopolamine patch - Pre-op and Midazolam  Airway Management Planned: Oral ETT  Additional Equipment:   Intra-op Plan:   Post-operative Plan: Extubation in OR  Informed Consent: I have reviewed the patients History and Physical, chart, labs and discussed the procedure including the risks, benefits and alternatives for the proposed anesthesia with the patient or authorized representative who has indicated his/her understanding and acceptance.     Dental advisory given  Plan Discussed with:   Anesthesia Plan Comments: (L Nipple sparing Masectomy w pecs 1 and 2 block Eras)       Anesthesia Quick Evaluation

## 2018-11-14 ENCOUNTER — Encounter (HOSPITAL_COMMUNITY): Payer: Self-pay | Admitting: General Surgery

## 2018-11-14 DIAGNOSIS — C50412 Malignant neoplasm of upper-outer quadrant of left female breast: Secondary | ICD-10-CM | POA: Diagnosis not present

## 2018-11-14 NOTE — Progress Notes (Signed)
Pt transported off unit via wheelchair with transporters x 2 at chairside.  AKingBSNRN

## 2018-11-14 NOTE — Progress Notes (Signed)
Patient ID: Heather [name], female   DOB: Mar 28, 1961, 58 y.o.   MRN: [id number] Doing great, will call with path when back, return to see me 2 weeks

## 2018-11-14 NOTE — Progress Notes (Signed)
Pt already has a breast cancer bag but given cups for measuring JP drainage and chart to record drainage.

## 2018-11-14 NOTE — Progress Notes (Signed)
Pt discharged to home in stable condition, all discharge instructions reviewed with and given to pt.  AKingBSNRN

## 2018-11-14 NOTE — Discharge Summary (Signed)
Physician Discharge Summary  Patient ID: Heather [name] MRN: [id number] DOB/AGE: 1961/01/29 58 y.o.  Admit date: 11/13/2018 Discharge date: 11/14/2018  Admission Diagnoses: Left breast cancer  Discharge Diagnoses:  Active Problems:   Breast cancer, left [facility])   Discharged Condition: stable  Hospital Course: Patient did well post operatively with pain controlled with oral medication, tolerating diet, and ambulatory with no assist. Instructed on compression drains and incision care.  Treatments: surgery: left nipple sparing mastectomy sentinel node left breast reconstruction with tissue expander acellular dermis 2.11.20  Discharge Exam: Blood pressure (!) 97/55, pulse 70, temperature 98 F (36.7 C), temperature source Oral, resp. rate 17, height 5\' 6"  (1.676 m), weight 53.5 kg, SpO2 99 %. Incision/Wound: chest soft no hematoma skin flaps viable Tegaderms in place drains serosanguinous  Disposition: Discharge disposition: 01-Home or Self Care       Discharge Instructions    Call MD for:  redness, tenderness, or signs of infection (pain, swelling, bleeding, redness, odor or green/yellow discharge around incision site)   Complete by:  As directed    Call MD for:  temperature >100.5   Complete by:  As directed    Discharge instructions   Complete by:  As directed    Ok to remove dressings and shower am 2.13.20. Soap and water ok, pat Tegaderms dry. Do not remove Tegaderms. No creams or ointments over incisions. Do not let drains dangle in shower, attach to lanyard or similar.Strip and record drains twice daily and bring log to clinic visit.  Breast binder or soft compression bra garment all other times.  Ok to raise arms above shoulders for bathing and dressing.  No house yard work or exercise until cleared by MD.   Recommend ibuprofen with meals to aid with pain control. Recommend Miralax or dulcolax as needed for constipation. Ok to also use Tylenol for pain control.   Driving Restrictions   Complete by:  As directed    No driving for 2 weeks then no driving if taking narcotics   Lifting restrictions   Complete by:  As directed    No lifting > 5 lbs until cleared by MD   Resume previous diet   Complete by:  As directed      Allergies as of 11/14/2018   No Known Allergies     Medication List    TAKE these medications   letrozole 2.5 MG tablet Commonly known as:  FEMARA TAKE 1 TABLET BY MOUTH EVERY DAY What changed:  when to take this   lisinopril-hydrochlorothiazide 10-12.5 MG tablet Commonly known as:  PRINZIDE,ZESTORETIC TAKE 1 TABLET BY MOUTH EVERY DAY   methocarbamol 500 MG tablet Commonly known as:  ROBAXIN Take 1 tablet (500 mg total) by mouth every 8 (eight) hours as needed for muscle spasms.   oxyCODONE 5 MG immediate release tablet Commonly known as:  ROXICODONE Take 1 tablet (5 mg total) by mouth every 4 (four) hours as needed.   sulfamethoxazole-trimethoprim 800-160 MG tablet Commonly known as:  BACTRIM DS,SEPTRA DS Take 1 tablet by mouth 2 (two) times daily.      Follow-up Information    Rolm Bookbinder, MD In 2 weeks.   Specialty:  General Surgery Contact information: [address] [place] [address] [phone]        Irene Limbo, MD In 1 week.   Specialty:  Plastic Surgery Why:  as scheduled Contact information: [place] [place] [address] [phone]  SignedIrene Limbo 11/14/2018, 7:41 AM

## 2018-11-14 NOTE — Addendum Note (Signed)
Addendum  created 11/14/18 1600 by Barnet Glasgow, MD   Intraprocedure Staff edited

## 2018-11-15 ENCOUNTER — Other Ambulatory Visit: Payer: Self-pay | Admitting: Hematology and Oncology

## 2018-11-16 ENCOUNTER — Telehealth: Payer: Self-pay | Admitting: *Deleted

## 2018-11-16 NOTE — Telephone Encounter (Signed)
Received order for Mammaprint Testing. Requisition faxed to pathology. Received by [employer].

## 2018-11-20 ENCOUNTER — Telehealth: Payer: Self-pay | Admitting: *Deleted

## 2018-11-20 NOTE — Telephone Encounter (Signed)
Spoke to pt regarding post op with Dr. Lindi Adie on 2/19. Pt wishes to r/s until after mammaprint results are back. appt cx for 2/19. Will call pt to r/s once results are back.

## 2018-11-21 ENCOUNTER — Inpatient Hospital Stay: Payer: BC Managed Care – PPO | Admitting: Hematology and Oncology

## 2018-11-23 DIAGNOSIS — Z9012 Acquired absence of left breast and nipple: Secondary | ICD-10-CM | POA: Insufficient documentation

## 2018-11-27 ENCOUNTER — Telehealth: Payer: Self-pay | Admitting: *Deleted

## 2018-11-27 NOTE — Progress Notes (Signed)
Patient Care Team: Emeterio Reeve, DO as PCP - General (Osteopathic Medicine) Rolm Bookbinder, MD as Consulting Physician (General Surgery) Heather Lose, MD as Consulting Physician (Hematology and Oncology) Eppie Gibson, MD as Attending Physician (Radiation Oncology)  DIAGNOSIS:    ICD-10-CM   1. 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, ER 100%, PR 60%, Ki-67 30%, HER-2 negative IHC 1+, pathology of calcifications is pending, biopsy of lymph nodes is discordant benign, T1cN0 stage Ia    09/19/2018 Cancer Staging    Staging form: Breast, AJCC 8th Edition - Clinical stage from 09/19/2018: Stage IA (cT1c(m), cN0, cM0, G2, ER+, PR+, HER2-) - Signed by Heather Lose, MD on 09/19/2018    11/13/2018 Surgery    Left mastectomy: Grade 2 IDC, 1.6 cm, with intermediate grade DCIS, margins negative, lymphovascular invasion present, 1/7 lymph nodes positive, MammaPrint high risk, ER 100%, PR 30%, HER-2 negative, Ki-67 30% T1CN1A stage Ia    11/13/2018 Cancer Staging    Staging form: Breast, AJCC 8th Edition - Pathologic stage from 11/13/2018: Stage IA (pT1c, pN1a, cM0, G2, ER+, PR+, HER2-) - Signed by Gardenia Phlegm, NP on 11/28/2018     CHIEF COMPLIANT: Follow-up s/p mastectomy to review pathology and Mammaprint results  INTERVAL HISTORY: Heather [name] is a 58 y.o. with above-mentioned history of left breast cancer who underwent a left mastectomy on 11/13/18 for  which pathology showed the cancer to be 1.6cm, grade 2 IDC with intermediate grade DCIS, ER 100%, PR 30%, Ki67 30%, with clean margins and involvement of one axillary lymph node. 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 She presents to the clinic alone today to discuss further treatment and is recovering well from surgery. Only her husband knows about her cancer and is supporting her in this process, as she has not told anyone else. 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 She was interested in a prescription for a wig.   REVIEW OF SYSTEMS:   Constitutional: Denies fevers, chills or abnormal weight loss Eyes: Denies blurriness of vision Ears, nose, mouth, throat, and face: Denies mucositis or sore throat Respiratory: Denies cough, dyspnea or wheezes Cardiovascular: Denies palpitation, chest discomfort Gastrointestinal: Denies nausea, heartburn or change in bowel habits Skin: Denies abnormal skin rashes Lymphatics: Denies new lymphadenopathy or easy bruising Neurological: Denies numbness, tingling or new weaknesses Behavioral/Psych: Mood is stable, no new changes  Extremities: No lower extremity edema Breast: denies any pain or lumps or nodules in either breasts All other systems were reviewed with the patient and are negative.  I have reviewed the past medical history, past surgical history, social history and family history with the patient and they are unchanged from previous note.  ALLERGIES:  has No Known Allergies.  MEDICATIONS:  Current Outpatient Medications  Medication Sig Dispense Refill  . letrozole (  FEMARA) 2.5 MG tablet TAKE 1 TABLET BY MOUTH EVERY DAY (Patient taking differently: Take 2.5 mg by mouth daily with lunch. ) 30 tablet 1  . lisinopril-hydrochlorothiazide (PRINZIDE,ZESTORETIC)  10-12.5 MG tablet TAKE 1 TABLET BY MOUTH EVERY DAY 90 tablet 0  . methocarbamol (ROBAXIN) 500 MG tablet Take 1 tablet (500 mg total) by mouth every 8 (eight) hours as needed for muscle spasms. 30 tablet 0  . oxyCODONE (ROXICODONE) 5 MG immediate release tablet Take 1 tablet (5 mg total) by mouth every 4 (four) hours as needed. 30 tablet 0  . sulfamethoxazole-trimethoprim (BACTRIM DS,SEPTRA DS) 800-160 MG tablet Take 1 tablet by mouth 2 (two) times daily. 12 tablet 0   No current facility-administered medications for this visit.     PHYSICAL EXAMINATION: ECOG PERFORMANCE STATUS: 1 - Symptomatic but completely ambulatory  Vitals:   11/28/18 1236  BP: 137/83  Pulse: (!) 101  Resp: 18  Temp: 99 F (37.2 C)  SpO2: 99%   Filed Weights   11/28/18 1236  Weight: 110 lb 3.2 oz (50 kg)    GENERAL: alert, no distress and comfortable SKIN: skin color, texture, turgor are normal, no rashes or significant lesions EYES: normal, Conjunctiva are pink and non-injected, sclera clear OROPHARYNX: no exudate, no erythema and lips, buccal mucosa, and tongue normal  NECK: supple, thyroid normal size, non-tender, without nodularity LYMPH: no palpable lymphadenopathy in the cervical, axillary or inguinal LUNGS: clear to auscultation and percussion with normal breathing effort HEART: regular rate & rhythm and no murmurs and no lower extremity edema ABDOMEN: abdomen soft, non-tender and normal bowel sounds MUSCULOSKELETAL: no cyanosis of digits and no clubbing  NEURO: alert & oriented x 3 with fluent speech, no focal motor/sensory deficits EXTREMITIES: No lower extremity edema  LABORATORY DATA:  I have reviewed the data as listed CMP Latest Ref Rng & Units 11/09/2018 09/19/2018 03/29/2016  Glucose 70 - 99 mg/dL 129(H) 146(H) 95  BUN 6 - 20 mg/dL _0 Creatinine 0.44 - 1.00 mg/dL 0.94 0.88 0.84  Sodium 135 - 145 mmol/L 143 139 140  Potassium 3.5 - 5.1 mmol/L 4.8 3.9 4.1  Chloride 98 - 111 mmol/L  108 103 103  CO2 22 - 32 mmol/L _1 Calcium 8.9 - 10.3 mg/dL 9.5 9.7 9.5  Total Protein 6.5 - 8.1 g/dL - 7.0 6.4  Total Bilirubin 0.3 - 1.2 mg/dL - 0.6 0.6  Alkaline Phos 38 - 126 U/L - 68 68  AST 15 - 41 U/L - 26 23  ALT 0 - 44 U/L - 15 9    Lab Results  Component Value Date   WBC 6.8 11/09/2018   HGB 12.5 11/09/2018   HCT 38.4 11/09/2018   MCV 84.0 11/09/2018   PLT 743 (H) 11/09/2018   NEUTROABS 5.4 09/19/2018    ASSESSMENT & PLAN:  Malignant neoplasm of upper-outer quadrant of left breast in female, estrogen receptor positive ([place]) 11/13/2018: Left mastectomy: Grade 2 IDC, 1.6 cm, with intermediate grade DCIS, margins negative, lymphovascular invasion present, 1/7 lymph nodes positive, MammaPrint high risk, ER 100%, PR 30%, HER-2 negative, Ki-67 30% T1CN1A stage Ia  Pathology counseling: I discussed the final pathology report of the patient provided  a copy of this report. I discussed the margins as well as lymph node surgeries. We also discussed the final staging along with previously performed ER/PR and HER-2/neu testing.  Treatment plan: 1.  Adjuvant chemotherapy with dose since he remains on Cytoxan x4 followed by Taxol  weekly x12 2.  Followed by adjuvant radiation therapy 3.  Followed by adjuvant antiestrogen therapy   Chemotherapy Counseling: I discussed the risks and benefits of chemotherapy including the risks of nausea/ vomiting, risk of infection from low WBC count, fatigue due to chemo or anemia, bruising or bleeding due to low platelets, mouth sores, loss/ change in taste and decreased appetite. Liver and kidney function will be monitored through out chemotherapy as abnormalities in liver and kidney function may be a side effect of treatment. Cardiac dysfunction due to Adriamycin was discussed in detail. Risk of permanent bone marrow dysfunction and leukemia due to chemo were also discussed.  Plan: 1.  Port placement 2.  Echocardiogram 3.  Chemo class Start  chemotherapy in 2 to 3 weeks    Orders Placed This Encounter  Procedures  . ECHOCARDIOGRAM COMPLETE    Standing Status:   Future    Standing Expiration Date:   02/26/2020    Order Specific Question:   Where should this test be performed    Answer:   [facility]    Order Specific Question:   Perflutren DEFINITY (image enhancing agent) should be administered unless hypersensitivity or allergy exist    Answer:   Administer Perflutren    Order Specific Question:   Reason for exam-Echo    Answer:   Chemo  V67.2 / Z09   The patient has a good understanding of the overall plan. she agrees with it. she will call with any problems that may develop before the next visit here.  Heather Lose, MD 11/28/2018  Julious Oka Dorshimer am acting as scribe for Dr. Nicholas [name].  I have reviewed the above documentation for accuracy and completeness, and I agree with the above.

## 2018-11-27 NOTE — Telephone Encounter (Signed)
Received MammaPrint results of high risk.  Patient is aware.  She will see Dr. Lindi Adie tomorrow 60 at 12:15 PM.  Team notified.

## 2018-11-28 ENCOUNTER — Other Ambulatory Visit: Payer: Self-pay | Admitting: Hematology and Oncology

## 2018-11-28 ENCOUNTER — Inpatient Hospital Stay: Payer: BC Managed Care – PPO | Attending: Genetic Counselor | Admitting: Hematology and Oncology

## 2018-11-28 ENCOUNTER — Other Ambulatory Visit: Payer: Self-pay | Admitting: General Surgery

## 2018-11-28 ENCOUNTER — Telehealth: Payer: Self-pay | Admitting: Hematology and Oncology

## 2018-11-28 DIAGNOSIS — Z79811 Long term (current) use of aromatase inhibitors: Secondary | ICD-10-CM | POA: Diagnosis not present

## 2018-11-28 DIAGNOSIS — Z170 Estrogen receptor positive status [ER+]: Secondary | ICD-10-CM | POA: Diagnosis not present

## 2018-11-28 DIAGNOSIS — C50412 Malignant neoplasm of upper-outer quadrant of left female breast: Secondary | ICD-10-CM | POA: Diagnosis not present

## 2018-11-28 DIAGNOSIS — Z79899 Other long term (current) drug therapy: Secondary | ICD-10-CM | POA: Diagnosis not present

## 2018-11-28 DIAGNOSIS — Z9012 Acquired absence of left breast and nipple: Secondary | ICD-10-CM | POA: Insufficient documentation

## 2018-11-28 MED ORDER — DEXAMETHASONE 4 MG PO TABS: ORAL_TABLET | ORAL | 0 refills | Status: DC

## 2018-11-28 MED ORDER — LORAZEPAM 0.5 MG PO TABS: 0.5000 mg | ORAL_TABLET | Freq: Every evening | ORAL | 0 refills | Status: DC | PRN

## 2018-11-28 MED ORDER — LIDOCAINE-PRILOCAINE 2.5-2.5 % EX CREA: TOPICAL_CREAM | CUTANEOUS | 3 refills | Status: DC

## 2018-11-28 MED ORDER — ONDANSETRON HCL 8 MG PO TABS: 8.0000 mg | ORAL_TABLET | Freq: Two times a day (BID) | ORAL | 1 refills | Status: DC | PRN

## 2018-11-28 MED ORDER — PROCHLORPERAZINE MALEATE 10 MG PO TABS: 10.0000 mg | ORAL_TABLET | Freq: Four times a day (QID) | ORAL | 1 refills | Status: DC | PRN

## 2018-11-28 NOTE — Telephone Encounter (Signed)
Gave avs and calendar ° °

## 2018-11-28 NOTE — Progress Notes (Signed)
START ON PATHWAY REGIMEN - Breast   Dose-Dense AC q14 days:   A cycle is every 14 days:     Doxorubicin      Cyclophosphamide      Pegfilgrastim-[id number]   **Always confirm dose/schedule in your pharmacy ordering system**  Paclitaxel 80 mg/m2 Weekly:   Administer weekly:     Paclitaxel   **Always confirm dose/schedule in your pharmacy ordering system**  Patient Characteristics: Postoperative without Neoadjuvant Therapy (Pathologic Staging), Invasive Disease, Adjuvant Therapy, HER2 Negative/Unknown/Equivocal, ER Positive, Node Positive, Node Positive (1-3), MammaPrint(R) Ordered, High Genomic Risk Therapeutic Status: Postoperative without Neoadjuvant Therapy (Pathologic Staging) AJCC Grade: G2 AJCC N Category: pN1a AJCC M Category: cM0 ER Status: Positive (+) AJCC 8 Stage Grouping: IA HER2 Status: Negative (-) Oncotype Dx Recurrence Score: Not Appropriate AJCC T Category: pT1c PR Status: Positive (+) Has this patient completed genomic testing<= Yes - MammaPrint(R) MammaPrint(R) Score: High Genomic Risk Intent of Therapy: Curative Intent, Discussed with Patient

## 2018-11-28 NOTE — Assessment & Plan Note (Signed)
11/13/2018: Left mastectomy: Grade 2 IDC, 1.6 cm, with intermediate grade DCIS, margins negative, lymphovascular invasion present, 1/7 lymph nodes positive, MammaPrint high risk, ER 100%, PR 30%, HER-2 negative, Ki-67 30% T1CN1A stage Ia  Pathology counseling: I discussed the final pathology report of the patient provided  a copy of this report. I discussed the margins as well as lymph node surgeries. We also discussed the final staging along with previously performed ER/PR and HER-2/neu testing.  Treatment plan: 1.  Adjuvant chemotherapy with dose since he remains on Cytoxan x4 followed by Taxol weekly x12 2.  Followed by adjuvant radiation therapy 3.  Followed by adjuvant antiestrogen therapy  UPBEAT clinical trial ([id number]): Newly diagnosed stage I to III breast cancer patients receiving either adjuvant or neoadjuvant chemotherapy undergo cardiac MRI before treatment and at 24 months along with neurocognitive testing, exercise and disability measures at baseline 3, 12 and 24 months.  Chemotherapy Counseling: I discussed the risks and benefits of chemotherapy including the risks of nausea/ vomiting, risk of infection from low WBC count, fatigue due to chemo or anemia, bruising or bleeding due to low platelets, mouth sores, loss/ change in taste and decreased appetite. Liver and kidney function will be monitored through out chemotherapy as abnormalities in liver and kidney function may be a side effect of treatment. Cardiac dysfunction due to Adriamycin was discussed in detail. Risk of permanent bone marrow dysfunction and leukemia due to chemo were also discussed.  Plan: 1.  Port placement 2.  Echocardiogram 3.  Chemo class Start chemotherapy in 2 to 3 weeks

## 2018-11-29 ENCOUNTER — Encounter (HOSPITAL_COMMUNITY): Payer: Self-pay | Admitting: Hematology and Oncology

## 2018-11-29 ENCOUNTER — Telehealth: Payer: Self-pay | Admitting: Hematology and Oncology

## 2018-11-29 NOTE — Telephone Encounter (Signed)
LVM for patient to return call for upcoming ECHO appointment

## 2018-12-04 ENCOUNTER — Other Ambulatory Visit: Payer: Self-pay

## 2018-12-04 ENCOUNTER — Ambulatory Visit: Payer: BC Managed Care – PPO | Admitting: Osteopathic Medicine

## 2018-12-04 ENCOUNTER — Encounter (HOSPITAL_BASED_OUTPATIENT_CLINIC_OR_DEPARTMENT_OTHER): Payer: Self-pay | Admitting: *Deleted

## 2018-12-04 ENCOUNTER — Encounter: Payer: Self-pay | Admitting: Osteopathic Medicine

## 2018-12-04 VITALS — BP 138/77 | HR 94 | Temp 98.2°F | Wt 106.2 lb

## 2018-12-04 DIAGNOSIS — I10 Essential (primary) hypertension: Secondary | ICD-10-CM

## 2018-12-04 DIAGNOSIS — Z0000 Encounter for general adult medical examination without abnormal findings: Secondary | ICD-10-CM | POA: Diagnosis not present

## 2018-12-04 DIAGNOSIS — R7309 Other abnormal glucose: Secondary | ICD-10-CM

## 2018-12-04 DIAGNOSIS — R634 Abnormal weight loss: Secondary | ICD-10-CM

## 2018-12-04 DIAGNOSIS — C50412 Malignant neoplasm of upper-outer quadrant of left female breast: Secondary | ICD-10-CM

## 2018-12-04 DIAGNOSIS — Z170 Estrogen receptor positive status [ER+]: Secondary | ICD-10-CM

## 2018-12-04 DIAGNOSIS — E559 Vitamin D deficiency, unspecified: Secondary | ICD-10-CM

## 2018-12-04 MED ORDER — DEXAMETHASONE 4 MG PO TABS: ORAL_TABLET | ORAL | 0 refills | Status: DC

## 2018-12-04 NOTE — Patient Instructions (Signed)
General Preventive Care  Most recent routine sceening lipids/other labs: ordered today.   Everyone should have blood pressure checked once per year. Goal 130/80 or less.   Tobacco: don't!   Alcohol: responsible moderation is ok for most adults - if you have concerns about your alcohol intake, please talk to me!   Exercise: as tolerated to reduce risk of cardiovascular disease and diabetes. Strength training will also prevent osteoporosis.   Mental health: if need for mental health care (medicines, counseling, other), or concerns about moods, please let me know!   Sexual health: if need for STD testing, or if concerns with libido/pain problems, please let me know!   Advanced Directive: Living Will and/or Healthcare Power of Attorney recommended for all adults, regardless of age or health. See attached.  Vaccines  Flu vaccine: recommended for almost everyone, every fall.   Shingles vaccine: Shingrix recommended after age 59. Will ask Dr Lindi Adie if we can go ahead and put you on our clinic waiting list.   Pneumonia vaccines: Prevnar and Pneumovax recommended after age 52, or sooner if certain medical conditions.  Tetanus booster: Tdap recommended every 10 years. Done 04/2016 Cancer screenings   Colon cancer screening: recommended for everyone at age 11, but some folks need a colonoscopy sooner if risk factors - think about colonoscopy vs cologuard.   Breast cancer follow-up: per oncologist and surgeons  Cervical cancer screening: Pap every 1 to 5 years depending on age and other risk factors.   Lung cancer screening: not needed given light smoking history  Infection screenings . HIV: recommended screening at least once age 44-65, more often as needed. . Gonorrhea/Chlamydia: screening as needed. . Hepatitis C: recommended for anyone born 15-1966 . TB: certain at-risk populations, or depending on work requirements and/or travel history Other . Bone Density Test: recommended for  women at age 67

## 2018-12-04 NOTE — Progress Notes (Signed)
HPI: Heather [name] is a 58 y.o. female who  has a past medical history of Cancer [facility]), Family history of breast cancer, and Hypertension.  she presents to [facility] today, 12/04/18,  for chief complaint of: Annual physical    Patient here for annual physical / wellness exam.  See preventive care reviewed as below.   Additional concerns today include:  Unintended weight loss, worried about going through chemo        Past medical, surgical, social and family history reviewed:  Patient Active Problem List   Diagnosis Date Noted  . Breast cancer, left ([place]) 11/13/2018  . Family history of breast cancer   . Malignant neoplasm of upper-outer quadrant of left breast in female, estrogen receptor positive ([place]) 09/17/2018  . Breast mass in female 09/04/2018  . Conjunctival irritation 08/16/2017  . Annual physical exam 04/13/2016  . Cardiac risk counseling 04/13/2016  . Failure to attend appointment 04/06/2016  . Essential hypertension 03/23/2016    Past Surgical History:  Procedure Laterality Date  . BREAST RECONSTRUCTION WITH PLACEMENT OF TISSUE EXPANDER AND ALLODERM Left 11/13/2018   Procedure: LEFT BREAST RECONSTRUCTION WITH PLACEMENT OF TISSUE EXPANDER AND ALLODERM;  Surgeon: Irene Limbo, MD;  Location: [facility];  Service: Plastics;  Laterality: Left;  . CESAREAN SECTION    . TUBAL LIGATION      Social History   Tobacco Use  . Smoking status: Former Research scientist (life sciences)  . Smokeless tobacco: Never Used  Substance Use Topics  . Alcohol use: Yes    Comment: rarely    Family History  Problem Relation Age of Onset  . Hypertension Mother   . Diabetes Mother   . Hypertension Sister   . Breast cancer Maternal Aunt 90  . Breast cancer Cousin 79       mat first cousin  . Heart attack Father 78     Current medication list and allergy/intolerance information reviewed:    Current Outpatient Medications  Medication Sig Dispense Refill   . lisinopril-hydrochlorothiazide (PRINZIDE,ZESTORETIC) 10-12.5 MG tablet TAKE 1 TABLET BY MOUTH EVERY DAY 90 tablet 0  . LORazepam (ATIVAN) 0.5 MG tablet Take 1 tablet (0.5 mg total) by mouth at bedtime as needed (Nausea or vomiting). 30 tablet 0  . dexamethasone (DECADRON) 4 MG tablet Take 1 tablet day after chemo and 1 tablet 2 days after chemo with food 8 tablet 0  . ondansetron (ZOFRAN) 8 MG tablet Take 1 tablet (8 mg total) by mouth 2 (two) times daily as needed. Start on the third day after chemotherapy. (Patient not taking: Reported on 12/04/2018) 30 tablet 1  . prochlorperazine (COMPAZINE) 10 MG tablet Take 1 tablet (10 mg total) by mouth every 6 (six) hours as needed (Nausea or vomiting). (Patient not taking: Reported on 12/04/2018) 30 tablet 1   No current facility-administered medications for this visit.     No Known Allergies    Review of Systems:  Constitutional:  No  fever, no chills, No recent illness, +unintentional weight changes. +significant fatigue.   HEENT: No  headache, no vision change, no hearing change, No sore throat, No  sinus pressure  Cardiac: No  chest pain, No  pressure, No palpitations, No  Orthopnea  Respiratory:  No  shortness of breath. No  Cough  Gastrointestinal: No  abdominal pain, No  nausea, No  vomiting,  No  blood in stool, No  diarrhea, No  constipation   Musculoskeletal: No new myalgia/arthralgia  Skin: No  Rash,  No other wounds/concerning lesions  Genitourinary: No  incontinence, No  abnormal genital bleeding, No abnormal genital discharge  Hem/Onc: No  easy bruising/bleeding  Endocrine: No cold intolerance,  No heat intolerance. No polyuria/polydipsia/polyphagia   Neurologic: No  weakness, No  dizziness  Psychiatric: No  concerns with depression, No  concerns with anxiety, No sleep problems, No mood problems  Exam:  BP 138/77 (BP Location: Left Arm, Patient Position: Sitting, Cuff Size: Normal)   Pulse 94   Temp 98.2 F (36.8 C)  (Oral)   Wt 106 lb 3.2 oz (48.2 kg)   BMI 17.14 kg/m   Constitutional: VS see above. General Appearance: alert, well-developed, well-nourished, NAD  Eyes: Normal lids and conjunctive, non-icteric sclera  Ears, Nose, Mouth, Throat: MMM, Normal external inspection ears/nares/mouth/lips/gums. TM normal bilaterally. Pharynx/tonsils no erythema, no exudate. Nasal mucosa normal.   Neck: No masses, trachea midline. No thyroid enlargement. No tenderness/mass appreciated. No lymphadenopathy  Respiratory: Normal respiratory effort. no wheeze, no rhonchi, no rales  Cardiovascular: S1/S2 normal, no murmur, no rub/gallop auscultated. RRR.   Gastrointestinal: Nontender, no masses. No hepatomegaly, no splenomegaly. No hernia appreciated. Bowel sounds normal. Rectal exam deferred.   Musculoskeletal: Gait normal. No clubbing/cyanosis of digits.   Neurological: Normal balance/coordination. No tremor. No cranial nerve deficit on limited exam.   Skin: warm, dry, intact. No rash/ulcer.  Psychiatric: Normal judgment/insight. Normal mood and affect. Oriented x3.         ASSESSMENT/PLAN: The primary encounter diagnosis was Annual physical exam. Diagnoses of Essential hypertension, Malignant neoplasm of upper-outer quadrant of left breast in female, estrogen receptor positive ([place]), and Unintended weight loss were also pertinent to this visit.   Orders Placed This Encounter  Procedures  . CBC  . COMPLETE METABOLIC PANEL WITH GFR  . Lipid panel  . TSH  . VITAMIN D 25 Hydroxy (Vit-D Deficiency, Fractures)  . HIV Antibody (routine testing w rflx)  . Hepatitis C antibody      Patient Instructions  General Preventive Care  Most recent routine sceening lipids/other labs: ordered today.   Everyone should have blood pressure checked once per year. Goal 130/80 or less.   Tobacco: don't!   Alcohol: responsible moderation is ok for most adults - if you have concerns about your alcohol intake,  please talk to me!   Exercise: as tolerated to reduce risk of cardiovascular disease and diabetes. Strength training will also prevent osteoporosis.   Mental health: if need for mental health care (medicines, counseling, other), or concerns about moods, please let me know!   Sexual health: if need for STD testing, or if concerns with libido/pain problems, please let me know!   Advanced Directive: Living Will and/or Healthcare Power of Attorney recommended for all adults, regardless of age or health. See attached.  Vaccines  Flu vaccine: recommended for almost everyone, every fall.   Shingles vaccine: Shingrix recommended after age 26. Will ask Dr Lindi Adie if we can go ahead and put you on our clinic waiting list.   Pneumonia vaccines: Prevnar and Pneumovax recommended after age 54, or sooner if certain medical conditions.  Tetanus booster: Tdap recommended every 10 years. Done 04/2016 Cancer screenings   Colon cancer screening: recommended for everyone at age 40, but some folks need a colonoscopy sooner if risk factors - think about colonoscopy vs cologuard.   Breast cancer follow-up: per oncologist and surgeons  Cervical cancer screening: Pap every 1 to 5 years depending on age and other risk factors.   Lung cancer  screening: not needed given light smoking history  Infection screenings . HIV: recommended screening at least once age 35-65, more often as needed. . Gonorrhea/Chlamydia: screening as needed. . Hepatitis C: recommended for anyone born 32-1966 . TB: certain at-risk populations, or depending on work requirements and/or travel history Other . Bone Density Test: recommended for women at age 76       Visit summary with medication list and pertinent instructions was printed for patient to review. All questions at time of visit were answered - patient instructed to contact office with any additional concerns or updates. ER/RTC precautions were reviewed with the patient.     Please note: voice recognition software was used to produce this document, and typos may escape review. Please contact Dr. Sheppard Coil for any needed clarifications.     Follow-up plan: Return in about 1 year (around 12/04/2019) for annual physical, or sooner as needed / based on labs .

## 2018-12-07 DIAGNOSIS — R7309 Other abnormal glucose: Secondary | ICD-10-CM | POA: Insufficient documentation

## 2018-12-07 DIAGNOSIS — E559 Vitamin D deficiency, unspecified: Secondary | ICD-10-CM | POA: Insufficient documentation

## 2018-12-07 LAB — CBC
HCT: 38.4 % (ref 35.0–45.0)
Hemoglobin: 12.3 g/dL (ref 11.7–15.5)
MCH: 26.6 pg — AB (ref 27.0–33.0)
MCHC: 32 g/dL (ref 32.0–36.0)
MCV: 83.1 fL (ref 80.0–100.0)
MPV: 10.2 fL (ref 7.5–12.5)
Platelets: 398 10*3/uL (ref 140–400)
RBC: 4.62 10*6/uL (ref 3.80–5.10)
RDW: 12.7 % (ref 11.0–15.0)
WBC: 6.9 10*3/uL (ref 3.8–10.8)

## 2018-12-07 LAB — COMPLETE METABOLIC PANEL WITH GFR
AG Ratio: 2 (calc) (ref 1.0–2.5)
ALBUMIN MSPROF: 4.7 g/dL (ref 3.6–5.1)
ALT: 12 U/L (ref 6–29)
AST: 22 U/L (ref 10–35)
Alkaline phosphatase (APISO): 66 U/L (ref 37–153)
BUN: 13 mg/dL (ref 7–25)
CO2: 31 mmol/L (ref 20–32)
Calcium: 10.6 mg/dL — ABNORMAL HIGH (ref 8.6–10.4)
Chloride: 100 mmol/L (ref 98–110)
Creat: 0.91 mg/dL (ref 0.50–1.05)
GFR, EST NON AFRICAN AMERICAN: 70 mL/min/{1.73_m2} (ref >=60)
GFR, Est African American: 81 mL/min/{1.73_m2} (ref >=60)
GLOBULIN: 2.4 g/dL (ref 1.9–3.7)
Glucose, Bld: 132 mg/dL — ABNORMAL HIGH (ref 65–99)
Potassium: 3.7 mmol/L (ref 3.5–5.3)
SODIUM: 141 mmol/L (ref 135–146)
Total Bilirubin: 0.6 mg/dL (ref 0.2–1.2)
Total Protein: 7.1 g/dL (ref 6.1–8.1)

## 2018-12-07 LAB — LIPID PANEL
Cholesterol: 201 mg/dL — ABNORMAL HIGH (ref <200)
HDL: 49 mg/dL — ABNORMAL LOW (ref >=50)
LDL Cholesterol (Calc): 136 mg/dL (calc) — ABNORMAL HIGH
Non-HDL Cholesterol (Calc): 152 mg/dL (calc) — ABNORMAL HIGH (ref <130)
Total CHOL/HDL Ratio: 4.1 (calc) (ref <5.0)
Triglycerides: 69 mg/dL (ref <150)

## 2018-12-07 LAB — HEPATITIS C ANTIBODY
Hepatitis C Ab: NONREACTIVE
SIGNAL TO CUT-OFF: 0.01 (ref <1.00)

## 2018-12-07 LAB — TEST AUTHORIZATION

## 2018-12-07 LAB — HEMOGLOBIN A1C W/OUT EAG: Hgb A1c MFr Bld: 5.8 % of total Hgb — ABNORMAL HIGH (ref <5.7)

## 2018-12-07 LAB — HIV ANTIBODY (ROUTINE TESTING W REFLEX): HIV: NONREACTIVE

## 2018-12-07 LAB — VITAMIN D 25 HYDROXY (VIT D DEFICIENCY, FRACTURES): Vit D, 25-Hydroxy: 13 ng/mL — ABNORMAL LOW (ref 30–100)

## 2018-12-07 LAB — TSH: TSH: 1.05 m[IU]/L (ref 0.40–4.50)

## 2018-12-07 MED ORDER — VITAMIN D (ERGOCALCIFEROL) 1.25 MG (50000 UNIT) PO CAPS: 50000.0000 [IU] | ORAL_CAPSULE | ORAL | 0 refills | Status: DC

## 2018-12-07 NOTE — Addendum Note (Signed)
Addended by: Maryla Morrow on: 12/07/2018 01:31 PM   Modules accepted: Orders

## 2018-12-10 ENCOUNTER — Other Ambulatory Visit: Payer: Self-pay

## 2018-12-10 ENCOUNTER — Inpatient Hospital Stay: Payer: BC Managed Care – PPO | Attending: Genetic Counselor

## 2018-12-10 ENCOUNTER — Ambulatory Visit (HOSPITAL_COMMUNITY)
Admission: RE | Admit: 2018-12-10 | Discharge: 2018-12-10 | Disposition: A | Discharge status: Home / Self Care | Payer: BC Managed Care – PPO | Source: Ambulatory Visit | Attending: Hematology and Oncology | Admitting: Hematology and Oncology

## 2018-12-10 DIAGNOSIS — R110 Nausea: Secondary | ICD-10-CM | POA: Insufficient documentation

## 2018-12-10 DIAGNOSIS — I10 Essential (primary) hypertension: Secondary | ICD-10-CM | POA: Diagnosis not present

## 2018-12-10 DIAGNOSIS — Z170 Estrogen receptor positive status [ER+]: Secondary | ICD-10-CM | POA: Insufficient documentation

## 2018-12-10 DIAGNOSIS — C50412 Malignant neoplasm of upper-outer quadrant of left female breast: Secondary | ICD-10-CM | POA: Insufficient documentation

## 2018-12-10 DIAGNOSIS — Z5111 Encounter for antineoplastic chemotherapy: Secondary | ICD-10-CM | POA: Insufficient documentation

## 2018-12-10 DIAGNOSIS — Z79899 Other long term (current) drug therapy: Secondary | ICD-10-CM | POA: Insufficient documentation

## 2018-12-10 DIAGNOSIS — R450 Nervousness: Secondary | ICD-10-CM | POA: Insufficient documentation

## 2018-12-10 DIAGNOSIS — Z5189 Encounter for other specified aftercare: Secondary | ICD-10-CM | POA: Insufficient documentation

## 2018-12-10 DIAGNOSIS — D6481 Anemia due to antineoplastic chemotherapy: Secondary | ICD-10-CM | POA: Insufficient documentation

## 2018-12-10 MED ORDER — LIDOCAINE-PRILOCAINE 2.5-2.5 % EX CREA: 1.0000 "application " | TOPICAL_CREAM | CUTANEOUS | 0 refills | Status: DC | PRN

## 2018-12-10 NOTE — Anesthesia Preprocedure Evaluation (Addendum)
Anesthesia Evaluation  Patient identified by MRN, date of birth, ID band Patient awake    Reviewed: Allergy & Precautions, NPO status , Patient's Chart, lab work & pertinent test results  Airway Mallampati: II  TM Distance: >3 FB Neck ROM: Full    Dental no notable dental hx. (+) Teeth Intact, Dental Advisory Given   Pulmonary former smoker,    Pulmonary exam normal breath sounds clear to auscultation       Cardiovascular Exercise Tolerance: Good hypertension, Pt. on medications Normal cardiovascular exam Rhythm:Regular Rate:Normal     Neuro/Psych negative neurological ROS  negative psych ROS   GI/Hepatic negative GI ROS, Neg liver ROS,   Endo/Other  negative endocrine ROS  Renal/GU negative Renal ROS     Musculoskeletal   Abdominal   Peds  Hematology   Anesthesia Other Findings   Reproductive/Obstetrics                            Anesthesia Physical Anesthesia Plan  ASA: III  Anesthesia Plan: General   Post-op Pain Management:    Induction: Intravenous  PONV Risk Score and Plan: 4 or greater and Treatment may vary due to age or medical condition, Dexamethasone and Ondansetron  Airway Management Planned: LMA  Additional Equipment:   Intra-op Plan:   Post-operative Plan:   Informed Consent: I have reviewed the patients History and Physical, chart, labs and discussed the procedure including the risks, benefits and alternatives for the proposed anesthesia with the patient or authorized representative who has indicated his/her understanding and acceptance.     Dental advisory given  Plan Discussed with: CRNA  Anesthesia Plan Comments:        Anesthesia Quick Evaluation

## 2018-12-10 NOTE — Progress Notes (Signed)
  Echocardiogram 2D Echocardiogram has been performed.  Heather [name] 12/10/2018, 1:51 PM

## 2018-12-10 NOTE — Progress Notes (Signed)
Patient given Ensure pre-surgery drink and chg with instruction on use, voiced understanding without questions.

## 2018-12-11 ENCOUNTER — Other Ambulatory Visit: Payer: Self-pay

## 2018-12-11 ENCOUNTER — Ambulatory Visit (HOSPITAL_BASED_OUTPATIENT_CLINIC_OR_DEPARTMENT_OTHER): Payer: BC Managed Care – PPO | Admitting: Anesthesiology

## 2018-12-11 ENCOUNTER — Ambulatory Visit (HOSPITAL_COMMUNITY): Payer: BC Managed Care – PPO

## 2018-12-11 ENCOUNTER — Encounter (HOSPITAL_BASED_OUTPATIENT_CLINIC_OR_DEPARTMENT_OTHER): Payer: Self-pay

## 2018-12-11 ENCOUNTER — Encounter (HOSPITAL_BASED_OUTPATIENT_CLINIC_OR_DEPARTMENT_OTHER)
Admission: RE | Disposition: A | Discharge status: Home / Self Care | Payer: Self-pay | Source: Home / Self Care | Attending: General Surgery

## 2018-12-11 ENCOUNTER — Ambulatory Visit (HOSPITAL_BASED_OUTPATIENT_CLINIC_OR_DEPARTMENT_OTHER)
Admission: RE | Admit: 2018-12-11 | Discharge: 2018-12-11 | Disposition: A | Discharge status: Home / Self Care | Payer: BC Managed Care – PPO | Attending: General Surgery | Admitting: General Surgery

## 2018-12-11 DIAGNOSIS — Z8261 Family history of arthritis: Secondary | ICD-10-CM | POA: Diagnosis not present

## 2018-12-11 DIAGNOSIS — C50919 Malignant neoplasm of unspecified site of unspecified female breast: Secondary | ICD-10-CM | POA: Insufficient documentation

## 2018-12-11 DIAGNOSIS — Z8249 Family history of ischemic heart disease and other diseases of the circulatory system: Secondary | ICD-10-CM | POA: Diagnosis not present

## 2018-12-11 DIAGNOSIS — R030 Elevated blood-pressure reading, without diagnosis of hypertension: Secondary | ICD-10-CM | POA: Insufficient documentation

## 2018-12-11 DIAGNOSIS — Z95828 Presence of other vascular implants and grafts: Secondary | ICD-10-CM

## 2018-12-11 DIAGNOSIS — Z87891 Personal history of nicotine dependence: Secondary | ICD-10-CM | POA: Insufficient documentation

## 2018-12-11 DIAGNOSIS — Z833 Family history of diabetes mellitus: Secondary | ICD-10-CM | POA: Insufficient documentation

## 2018-12-11 DIAGNOSIS — Z803 Family history of malignant neoplasm of breast: Secondary | ICD-10-CM | POA: Insufficient documentation

## 2018-12-11 DIAGNOSIS — Z8042 Family history of malignant neoplasm of prostate: Secondary | ICD-10-CM | POA: Diagnosis not present

## 2018-12-11 HISTORY — PX: PORTACATH PLACEMENT: SHX2246

## 2018-12-11 SURGERY — INSERTION, TUNNELED CENTRAL VENOUS DEVICE, WITH PORT
Anesthesia: General | Site: Chest | Laterality: Right

## 2018-12-11 MED ORDER — ACETAMINOPHEN 325 MG PO TABS: 650.0000 mg | ORAL_TABLET | Freq: Once | ORAL | Status: DC

## 2018-12-11 MED ORDER — HEPARIN SOD (PORK) LOCK FLUSH 100 UNIT/ML IV SOLN
INTRAVENOUS | Status: AC
  Filled 2018-12-11: qty 5

## 2018-12-11 MED ORDER — ACETAMINOPHEN 500 MG PO TABS
ORAL_TABLET | ORAL | Status: AC
  Filled 2018-12-11: qty 2

## 2018-12-11 MED ORDER — FENTANYL CITRATE (PF) 100 MCG/2ML IJ SOLN
25.0000 ug | INTRAMUSCULAR | Status: DC | PRN
  Administered 2018-12-11: 25 ug via INTRAVENOUS

## 2018-12-11 MED ORDER — PHENYLEPHRINE HCL 10 MG/ML IJ SOLN
INTRAMUSCULAR | Status: DC | PRN
  Administered 2018-12-11: 80 ug via INTRAVENOUS

## 2018-12-11 MED ORDER — ENSURE PRE-SURGERY PO LIQD: 296.0000 mL | Freq: Once | ORAL | Status: DC

## 2018-12-11 MED ORDER — CEFAZOLIN SODIUM-DEXTROSE 2-4 GM/100ML-% IV SOLN
INTRAVENOUS | Status: AC
  Filled 2018-12-11: qty 100

## 2018-12-11 MED ORDER — FENTANYL CITRATE (PF) 100 MCG/2ML IJ SOLN
INTRAMUSCULAR | Status: AC
  Filled 2018-12-11: qty 2

## 2018-12-11 MED ORDER — LACTATED RINGERS IV SOLN
INTRAVENOUS | Status: DC
  Administered 2018-12-11 (×2): via INTRAVENOUS

## 2018-12-11 MED ORDER — CEFAZOLIN SODIUM-DEXTROSE 2-4 GM/100ML-% IV SOLN
2.0000 g | INTRAVENOUS | Status: AC
  Administered 2018-12-11: 2 g via INTRAVENOUS

## 2018-12-11 MED ORDER — BUPIVACAINE HCL (PF) 0.25 % IJ SOLN
INTRAMUSCULAR | Status: DC | PRN
  Administered 2018-12-11: 10 mL

## 2018-12-11 MED ORDER — GABAPENTIN 100 MG PO CAPS
ORAL_CAPSULE | ORAL | Status: AC
  Filled 2018-12-11: qty 1

## 2018-12-11 MED ORDER — PROPOFOL 10 MG/ML IV BOLUS
INTRAVENOUS | Status: DC | PRN
  Administered 2018-12-11: 150 mg via INTRAVENOUS

## 2018-12-11 MED ORDER — BUPIVACAINE HCL (PF) 0.25 % IJ SOLN
INTRAMUSCULAR | Status: AC
  Filled 2018-12-11: qty 30

## 2018-12-11 MED ORDER — EPHEDRINE SULFATE 50 MG/ML IJ SOLN
INTRAMUSCULAR | Status: DC | PRN
  Administered 2018-12-11: 10 mg via INTRAVENOUS

## 2018-12-11 MED ORDER — ACETAMINOPHEN 500 MG PO TABS
1000.0000 mg | ORAL_TABLET | ORAL | Status: AC
  Administered 2018-12-11: 1000 mg via ORAL

## 2018-12-11 MED ORDER — DEXAMETHASONE SODIUM PHOSPHATE 4 MG/ML IJ SOLN
INTRAMUSCULAR | Status: DC | PRN
  Administered 2018-12-11: 5 mg via INTRAVENOUS

## 2018-12-11 MED ORDER — MIDAZOLAM HCL 2 MG/2ML IJ SOLN: 1.0000 mg | INTRAMUSCULAR | Status: DC | PRN

## 2018-12-11 MED ORDER — HEPARIN SOD (PORK) LOCK FLUSH 100 UNIT/ML IV SOLN
INTRAVENOUS | Status: DC | PRN
  Administered 2018-12-11: 500 [IU]

## 2018-12-11 MED ORDER — MIDAZOLAM HCL 2 MG/2ML IJ SOLN
INTRAMUSCULAR | Status: AC
  Filled 2018-12-11: qty 2

## 2018-12-11 MED ORDER — HEPARIN (PORCINE) IN NACL 1000-0.9 UT/500ML-% IV SOLN
INTRAVENOUS | Status: AC
  Filled 2018-12-11: qty 500

## 2018-12-11 MED ORDER — HEPARIN (PORCINE) IN NACL 2-0.9 UNITS/ML
INTRAMUSCULAR | Status: AC | PRN
  Administered 2018-12-11: 1 via INTRAVENOUS

## 2018-12-11 MED ORDER — ONDANSETRON HCL 4 MG/2ML IJ SOLN: 4.0000 mg | Freq: Once | INTRAMUSCULAR | Status: DC | PRN

## 2018-12-11 MED ORDER — GABAPENTIN 100 MG PO CAPS
100.0000 mg | ORAL_CAPSULE | ORAL | Status: AC
  Administered 2018-12-11: 100 mg via ORAL

## 2018-12-11 MED ORDER — FENTANYL CITRATE (PF) 100 MCG/2ML IJ SOLN
50.0000 ug | INTRAMUSCULAR | Status: DC | PRN
  Administered 2018-12-11: 50 ug via INTRAVENOUS

## 2018-12-11 MED ORDER — KETOROLAC TROMETHAMINE 15 MG/ML IJ SOLN: 15.0000 mg | Freq: Once | INTRAMUSCULAR | Status: DC | PRN

## 2018-12-11 MED ORDER — SCOPOLAMINE 1 MG/3DAYS TD PT72: 1.0000 | MEDICATED_PATCH | Freq: Once | TRANSDERMAL | Status: DC | PRN

## 2018-12-11 SURGICAL SUPPLY — 56 items
BAG DECANTER FOR FLEXI CONT (MISCELLANEOUS) ×3 IMPLANT
BENZOIN TINCTURE PRP APPL 2/3 (GAUZE/BANDAGES/DRESSINGS) ×3 IMPLANT
BLADE SURG 11 STRL SS (BLADE) ×3 IMPLANT
BLADE SURG 15 STRL LF DISP TIS (BLADE) ×1 IMPLANT
BLADE SURG 15 STRL SS (BLADE) ×2
CANISTER SUCT 1200ML W/VALVE (MISCELLANEOUS) IMPLANT
CHLORAPREP W/TINT 26 (MISCELLANEOUS) ×3 IMPLANT
CLOSURE WOUND 1/2 X4 (GAUZE/BANDAGES/DRESSINGS) ×1
COVER BACK TABLE 60X90IN (DRAPES) ×3 IMPLANT
COVER MAYO STAND STRL (DRAPES) ×3 IMPLANT
COVER PROBE 5X48 (MISCELLANEOUS)
COVER WAND RF STERILE (DRAPES) IMPLANT
DECANTER SPIKE VIAL GLASS SM (MISCELLANEOUS) IMPLANT
DERMABOND ADVANCED (GAUZE/BANDAGES/DRESSINGS) ×2
DERMABOND ADVANCED .7 DNX12 (GAUZE/BANDAGES/DRESSINGS) ×1 IMPLANT
DRAPE C-ARM 42X72 X-RAY (DRAPES) ×3 IMPLANT
DRAPE LAPAROSCOPIC ABDOMINAL (DRAPES) ×3 IMPLANT
DRAPE UTILITY XL STRL (DRAPES) ×3 IMPLANT
DRSG TEGADERM 4X4.75 (GAUZE/BANDAGES/DRESSINGS) IMPLANT
ELECT COATED BLADE 2.86 ST (ELECTRODE) ×3 IMPLANT
ELECT REM PT RETURN 9FT ADLT (ELECTROSURGICAL) ×3
ELECTRODE REM PT RTRN 9FT ADLT (ELECTROSURGICAL) ×1 IMPLANT
GAUZE SPONGE 4X4 12PLY STRL LF (GAUZE/BANDAGES/DRESSINGS) ×3 IMPLANT
GLOVE BIO SURGEON STRL SZ7 (GLOVE) ×3 IMPLANT
GLOVE BIOGEL PI IND STRL 7.0 (GLOVE) ×1 IMPLANT
GLOVE BIOGEL PI IND STRL 7.5 (GLOVE) ×2 IMPLANT
GLOVE BIOGEL PI INDICATOR 7.0 (GLOVE) ×2
GLOVE BIOGEL PI INDICATOR 7.5 (GLOVE) ×4
GLOVE SURG SYN 7.5  E (GLOVE) ×2
GLOVE SURG SYN 7.5 E (GLOVE) ×1 IMPLANT
GOWN STRL REUS W/ TWL LRG LVL3 (GOWN DISPOSABLE) ×1 IMPLANT
GOWN STRL REUS W/ TWL XL LVL3 (GOWN DISPOSABLE) ×1 IMPLANT
GOWN STRL REUS W/TWL LRG LVL3 (GOWN DISPOSABLE) ×2
GOWN STRL REUS W/TWL XL LVL3 (GOWN DISPOSABLE) ×2
IV KIT MINILOC 20X1 SAFETY (NEEDLE) IMPLANT
KIT CVR 48X5XPRB PLUP LF (MISCELLANEOUS) IMPLANT
KIT PORT POWER 8FR ISP CVUE (Port) ×3 IMPLANT
NDL SAFETY ECLIPSE 18X1.5 (NEEDLE) IMPLANT
NEEDLE HYPO 18GX1.5 SHARP (NEEDLE)
NEEDLE HYPO 25X1 1.5 SAFETY (NEEDLE) ×3 IMPLANT
PACK BASIN DAY SURGERY FS (CUSTOM PROCEDURE TRAY) ×3 IMPLANT
PENCIL BUTTON HOLSTER BLD 10FT (ELECTRODE) ×3 IMPLANT
SLEEVE SCD COMPRESS KNEE MED (MISCELLANEOUS) ×3 IMPLANT
STRIP CLOSURE SKIN 1/2X4 (GAUZE/BANDAGES/DRESSINGS) ×2 IMPLANT
SUT MNCRL AB 4-0 PS2 18 (SUTURE) ×3 IMPLANT
SUT PROLENE 2 0 SH DA (SUTURE) ×3 IMPLANT
SUT SILK 2 0 TIES 17X18 (SUTURE)
SUT SILK 2-0 18XBRD TIE BLK (SUTURE) IMPLANT
SUT VIC AB 3-0 SH 27 (SUTURE) ×2
SUT VIC AB 3-0 SH 27X BRD (SUTURE) ×1 IMPLANT
SYR 5ML LUER SLIP (SYRINGE) ×3 IMPLANT
SYR CONTROL 10ML LL (SYRINGE) ×3 IMPLANT
TOWEL GREEN STERILE FF (TOWEL DISPOSABLE) ×3 IMPLANT
TUBE CONNECTING 20'X1/4 (TUBING)
TUBE CONNECTING 20X1/4 (TUBING) IMPLANT
YANKAUER SUCT BULB TIP NO VENT (SUCTIONS) IMPLANT

## 2018-12-11 NOTE — Transfer of Care (Signed)
Immediate Anesthesia Transfer of Care Note  Patient: Heather [name]  Procedure(s) Performed: INSERTION PORT-A-CATH WITH ULTRASOUND (Right Chest)  Patient Location: PACU  Anesthesia Type:General  Level of Consciousness: sedated  Airway & Oxygen Therapy: Patient Spontanous Breathing and Patient connected to face mask oxygen  Post-op Assessment: Report given to RN and Post -op Vital signs reviewed and stable  Post vital signs: Reviewed and stable  Last Vitals:  Vitals Value Taken Time  BP    Temp    Pulse    Resp    SpO2      Last Pain:  Vitals:   12/11/18 1121  TempSrc: Oral  PainSc: 0-No pain         Complications: No apparent anesthesia complications

## 2018-12-11 NOTE — Anesthesia Postprocedure Evaluation (Signed)
Anesthesia Post Note  Patient: Heather [name]  Procedure(s) Performed: INSERTION PORT-A-CATH WITH ULTRASOUND (Right Chest)     Patient location during evaluation: PACU Anesthesia Type: General Level of consciousness: awake and alert Pain management: pain level controlled Vital Signs Assessment: post-procedure vital signs reviewed and stable Respiratory status: spontaneous breathing, nonlabored ventilation, respiratory function stable and patient connected to nasal cannula oxygen Cardiovascular status: blood pressure returned to baseline and stable Postop Assessment: no apparent nausea or vomiting Anesthetic complications: no    Last Vitals:  Vitals:   12/11/18 1430 12/11/18 1516  BP: 132/75 128/79  Pulse: 71 77  Resp: 13 16  Temp:  36.7 C  SpO2: 100% 100%    Last Pain:  Vitals:   12/11/18 1516  TempSrc:   PainSc: 2    Pain Goal:                   Barnet Glasgow

## 2018-12-11 NOTE — Discharge Instructions (Signed)
PORT-A-CATH: POST OP INSTRUCTIONS  Always review your discharge instruction sheet given to you by the facility where your surgery was performed.  Use ibuprofen 400mg  every 8 hours for next 48 hours and ice port site.  1. A prescription for pain medication may be given to you upon discharge. Take your pain medication as prescribed, if needed. If narcotic pain medicine is not needed, then you make take acetaminophen (Tylenol) or ibuprofen (Advil) as needed.  No Tylenol until 5:30pm if needed. 2. Take your usually prescribed medications unless otherwise directed. 3. If you need a refill on your pain medication, please contact our office. All narcotic pain medicine now requires a paper prescription.  Phoned in and fax refills are no longer allowed by law.  Prescriptions will not be filled after 5 pm or on weekends.  4. You should follow a light diet for the remainder of the day after your procedure. 5. Most patients will experience some mild swelling and/or bruising in the area of the incision. It may take several days to resolve. 6. It is common to experience some constipation if taking pain medication after surgery. Increasing fluid intake and taking a stool softener (such as Colace) will usually help or prevent this problem from occurring. A mild laxative (Milk of Magnesia or Miralax) should be taken according to package directions if there are no bowel movements after 48 hours.  7. Unless discharge instructions indicate otherwise, you may remove your bandages 48 hours after surgery, and you may shower at that time. You may have steri-strips (small white skin tapes) in place directly over the incision.  These strips should be left on the skin for 7-10 days.  If your surgeon used Dermabond (skin glue) on the incision, you may shower in 24 hours.  The glue will flake off over the next 2-3 weeks.  8. If your port is left accessed at the end of surgery (needle left in port), the dressing cannot get wet  and should only by changed by a healthcare professional. When the port is no longer accessed (when the needle has been removed), follow step 7.   9. ACTIVITIES:  Limit activity involving your arms for the next 72 hours. Do no strenuous exercise or activity for 1 week. You may drive when you are no longer taking prescription pain medication, you can comfortably wear a seatbelt, and you can maneuver your car. 10.You may need to see your doctor in the office for a follow-up appointment.  Please       check with your doctor.  11.When you receive a new Port-a-Cath, you will get a product guide and        ID card.  Please keep them in case you need them.  WHEN TO CALL YOUR DOCTOR [phone]): 1. Fever over 101.0 2. Chills 3. Continued bleeding from incision 4. Increased redness and tenderness at the site 5. Shortness of breath, difficulty breathing   The clinic staff is available to answer your questions during regular business hours. Please dont hesitate to call and ask to speak to one of the nurses or medical assistants for clinical concerns. If you have a medical emergency, go to the nearest emergency room or call 911.  A surgeon from [facility] is always on call at the hospital.     For further information, please visit [email or web address]   Post Anesthesia Home Care Instructions  Activity: Get plenty of rest for the remainder of the day. A responsible individual must  stay with you for 24 hours following the procedure.  For the next 24 hours, DO NOT: -Drive a car -Paediatric nurse -Drink alcoholic beverages -Take any medication unless instructed by your physician -Make any legal decisions or sign important papers.  Meals: Start with liquid foods such as gelatin or soup. Progress to regular foods as tolerated. Avoid greasy, spicy, heavy foods. If nausea and/or vomiting occur, drink only clear liquids until the nausea and/or vomiting subsides. Call your  physician if vomiting continues.  Special Instructions/Symptoms: Your throat may feel dry or sore from the anesthesia or the breathing tube placed in your throat during surgery. If this causes discomfort, gargle with warm salt water. The discomfort should disappear within 24 hours.  If you had a scopolamine patch placed behind your ear for the management of post- operative nausea and/or vomiting:  1. The medication in the patch is effective for 72 hours, after which it should be removed.  Wrap patch in a tissue and discard in the trash. Wash hands thoroughly with soap and water. 2. You may remove the patch earlier than 72 hours if you experience unpleasant side effects which may include dry mouth, dizziness or visual disturbances. 3. Avoid touching the patch. Wash your hands with soap and water after contact with the patch.

## 2018-12-11 NOTE — Interval H&P Note (Signed)
History and Physical Interval Note:  12/11/2018 12:39 PM High risk mammaprint, due to begin chemotherapy, will place port today Heather [name]  has presented today for surgery, with the diagnosis of BREAST CANCER.  The various methods of treatment have been discussed with the patient and family. After consideration of risks, benefits and other options for treatment, the patient has consented to  Procedure(s): INSERTION PORT-A-CATH WITH ULTRASOUND (N/A) as a surgical intervention.  The patient's history has been reviewed, patient examined, no change in status, stable for surgery.  I have reviewed the patient's chart and labs.  Questions were answered to the patient's satisfaction.     Rolm Bookbinder

## 2018-12-11 NOTE — Op Note (Signed)
Preoperative diagnosis:breast cancer, clinical stage II Postoperative diagnosis: same as above Procedure: right ij US guided powerport insertion Surgeon: Dr Serita Grammes EBL: minimal Anes: general  Specimensnone Complications none Drains none Sponge count correct Dispo to pacu stable  Indications: This is a56 yof with newly diagnosed breast cancer.she has undergone mastectomy with sn biopsy. We have elected for no more axillary surgery. She has high risk mammaprint. We discussed all options and elected to proceed with systemic therapy.She is due to begin chemotherapy. We discussed port placement.  Procedure: After informed consent was obtained the patient was taken to the operating room. She was given antibiotics. Sequential compression devices were on her legs. She was then placed under general anesthesia. Then she was prepped and draped in the standard sterile surgical fashion. Surgical timeout was then performed. Plastic surgery has completed their portion of the procedure.   Ithenused the ultrasound to identify the right internal jugular vein. I then accessed the vein using the ultrasound.This aspirated blood. I then placed the wire. This was confirmed by fluoroscopy and ultrasound to be in the correct position.I then infiltrated marcaine and made anincision. I created a pocket.I tunneled the line between the 2 sites.I then dilated the tract and placed the dilator assembly with the sheath. This was done under fluoroscopy. I then removed the sheath and dilator. The wire was also removed. The line was then pulled back to be in the venacava. I hooked this up to the port. I sutured this into place with 2-0 Prolene in 2 places. This aspirated blood and flushed easily.This was confirmed with a final fluoroscopy. I then closed this with 2-0 Vicryl and 4-0 Monocryl.This withdrew blood and I placed heparin in it.Dermabond was placed on both the incisions.She tolerated this  well and was transferred to the recovery room in stable condition

## 2018-12-11 NOTE — Anesthesia Procedure Notes (Signed)
Procedure Name: LMA Insertion Date/Time: 12/11/2018 1:00 PM Performed by: Willa Frater, CRNA Pre-anesthesia Checklist: Patient identified, Emergency Drugs available, Suction available and Patient being monitored Patient Re-evaluated:Patient Re-evaluated prior to induction Oxygen Delivery Method: Circle system utilized Preoxygenation: Pre-oxygenation with 100% oxygen Induction Type: IV induction Ventilation: Mask ventilation without difficulty LMA: LMA inserted LMA Size: 4.0 Number of attempts: 1 Airway Equipment and Method: Bite block Placement Confirmation: positive ETCO2 Tube secured with: Tape Dental Injury: Teeth and Oropharynx as per pre-operative assessment

## 2018-12-12 ENCOUNTER — Encounter: Payer: Self-pay | Admitting: Hematology and Oncology

## 2018-12-12 ENCOUNTER — Encounter (HOSPITAL_BASED_OUTPATIENT_CLINIC_OR_DEPARTMENT_OTHER): Payer: Self-pay | Admitting: General Surgery

## 2018-12-12 NOTE — Progress Notes (Signed)
Called Heather [name] tointroduce myself as her Arboriculturist and to discuss copay assistance.Heather [name] gave me consent to apply in her behalf soI enrolled her in the [place] $15,000 for 12 monthsfrom3/11/20.Heather [name] will pay $0 for her first treatment and $0 for each subsequent treatment (up to $7,200 per treatment). I also informed her of the [employer], went over what it covers and gave her the income requirement.  She stated she's over the income requirement so she won't qualify for the grant at this time.  I will give her my card at her next visit for anyquestions or concernsshe may havein the future.

## 2018-12-13 ENCOUNTER — Ambulatory Visit: Payer: BC Managed Care – PPO | Attending: General Surgery | Admitting: Physical Therapy

## 2018-12-13 ENCOUNTER — Encounter: Payer: Self-pay | Admitting: Physical Therapy

## 2018-12-13 ENCOUNTER — Other Ambulatory Visit: Payer: Self-pay

## 2018-12-13 DIAGNOSIS — M25612 Stiffness of left shoulder, not elsewhere classified: Secondary | ICD-10-CM | POA: Diagnosis present

## 2018-12-13 DIAGNOSIS — Z170 Estrogen receptor positive status [ER+]: Secondary | ICD-10-CM | POA: Insufficient documentation

## 2018-12-13 DIAGNOSIS — Z483 Aftercare following surgery for neoplasm: Secondary | ICD-10-CM | POA: Diagnosis present

## 2018-12-13 DIAGNOSIS — C50412 Malignant neoplasm of upper-outer quadrant of left female breast: Secondary | ICD-10-CM | POA: Diagnosis present

## 2018-12-13 NOTE — Therapy (Signed)
[place], [place], [address] Phone: [phone]   Fax:  [phone]  Physical Therapy Treatment  Patient Details  Name: Heather [name] MRN: [id number] Date of Birth: [birth year] Referring Provider (PT): Dr. Rolm Bookbinder   Encounter Date: 12/13/2018  PT End of Session - 12/13/18 1146    Visit Number  2    Number of Visits  2    PT Start Time  9794    PT Stop Time  1140    PT Time Calculation (min)  48 min    Activity Tolerance  Patient tolerated treatment well    Behavior During Therapy  [facility] for tasks assessed/performed       Past Medical History:  Diagnosis Date   Cancer ([place])    Breast Cancer 09/2018   Family history of breast cancer    Hypertension     Past Surgical History:  Procedure Laterality Date   BREAST RECONSTRUCTION WITH PLACEMENT OF TISSUE EXPANDER AND ALLODERM Left 11/13/2018   Procedure: LEFT BREAST RECONSTRUCTION WITH PLACEMENT OF TISSUE EXPANDER AND ALLODERM;  Surgeon: Irene Limbo, MD;  Location: [facility];  Service: Plastics;  Laterality: Left;   CESAREAN SECTION     PORTACATH PLACEMENT Right 12/11/2018   Procedure: INSERTION PORT-A-CATH WITH ULTRASOUND;  Surgeon: Rolm Bookbinder, MD;  Location: [facility];  Service: General;  Laterality: Right;   TUBAL LIGATION      There were no vitals filed for this visit.  Subjective Assessment - 12/13/18 1058    Subjective  Patient reports she underwent a left mastectomy and targeted axillary lymph node dissection (1/7 nodes positive) on 11/13/2018. Her Mammaprint was high risk so she will begin chemotherapy on 12/17/2018. This will be followed by radiation and anti-estrogen therapy.    Pertinent History  Patient was diagnosed on 09/07/18 with left grade II invasive ductal carcinoma breast cancer. Patient reports she underwent a left mastectomy and targeted axillary lymph node dissection (1/7 nodes  positive) on 11/13/2018 It is ER/PR positive and HER2 negative with a Ki67 of 30%. There are 3 abnormal appearing nodes in her left axilla.    Patient Stated Goals  See if my arm is ok    Currently in Pain?  No/denies         [facility] PT Assessment - 12/13/18 0001      Assessment   Medical Diagnosis  s/p left mastectomy and targeted dissection    Referring Provider (PT)  Dr. Rolm Bookbinder    Onset Date/Surgical Date  11/13/18    Hand Dominance  Right    Prior Therapy  Baselines      Precautions   Precautions  Other (comment)    Precaution Comments  recent surgery      Restrictions   Weight Bearing Restrictions  No      Balance Screen   Has the patient fallen in the past 6 months  No    Has the patient had a decrease in activity level because of a fear of falling?   No    Is the patient reluctant to leave their home because of a fear of falling?   No      Home Environment   Living Environment  Private residence    Living Arrangements  Spouse/significant other;Children   Husband, 12 and 35 y.o. children   Available Help at Discharge  Family      Prior Function   Level of Independence  Independent  Vocation  Full time employment    Lexicographer at [employer]  She is walking 2x/week for 15 minutes      Cognition   Overall Cognitive Status  Within Functional Limits for tasks assessed      Observation/Other Assessments   Observations  Significant cording evident in left axilla; photo taken. Otherwise incisions appear to be healing well.      Posture/Postural Control   Posture/Postural Control  Postural limitations    Postural Limitations  Rounded Shoulders;Forward head      ROM / Strength   AROM / PROM / Strength  AROM      AROM   AROM Assessment Site  Shoulder    Right/Left Shoulder  Left    Left Shoulder Extension  45 Degrees    Left Shoulder Flexion  112 Degrees    Left Shoulder ABduction  132 Degrees    Left Shoulder  Internal Rotation  62 Degrees    Left Shoulder External Rotation  88 Degrees      Strength   Overall Strength  Unable to assess;Due to precautions        LYMPHEDEMA/ONCOLOGY QUESTIONNAIRE - 12/13/18 1110      Type   Cancer Type  Left breast cancer      Surgeries   Mastectomy Date  11/13/18    Saline Implant Reconstruction Date  11/13/18    Number Lymph Nodes Removed  7      Treatment   Active Chemotherapy Treatment  No    Past Chemotherapy Treatment  No    Active Radiation Treatment  No    Past Radiation Treatment  No    Current Hormone Treatment  No    Past Hormone Therapy  No      What other symptoms do you have   Are you Having Heaviness or Tightness  No    Are you having Pain  No    Are you having pitting edema  No    Is it Hard or Difficult finding clothes that fit  No    Do you have infections  No    Is there Decreased scar mobility  Yes    Stemmer Sign  No      Lymphedema Assessments   Lymphedema Assessments  Upper extremities      Right Upper Extremity Lymphedema   10 cm Proximal to Olecranon Process  22.7 cm    Olecranon Process  22 cm    10 cm Proximal to Ulnar Styloid Process  18.7 cm    Just Proximal to Ulnar Styloid Process  14.4 cm    Across Hand at [employer]  19.1 cm    At [place] of 2nd Digit  5.9 cm      Left Upper Extremity Lymphedema   10 cm Proximal to Olecranon Process  22.8 cm    Olecranon Process  22.1 cm    10 cm Proximal to Ulnar Styloid Process  17.4 cm    Just Proximal to Ulnar Styloid Process  14.5 cm    Across Hand at [employer]  19.1 cm    At [place] of 2nd Digit  5.7 cm        Quick Dash - 12/13/18 0001    Open a tight or new jar  No difficulty    Do heavy household chores (wash walls, wash floors)  No difficulty    Carry a shopping bag or briefcase  No difficulty  Wash your back  Mild difficulty    Use a knife to cut food  No difficulty    Recreational activities in which you take some force or impact through your  arm, shoulder, or hand (golf, hammering, tennis)  Moderate difficulty    During the past week, to what extent has your arm, shoulder or hand problem interfered with your normal social activities with family, friends, neighbors, or groups?  Slightly    During the past week, to what extent has your arm, shoulder or hand problem limited your work or other regular daily activities  Slightly    Arm, shoulder, or hand pain.  Mild    Tingling (pins and needles) in your arm, shoulder, or hand  None    Difficulty Sleeping  No difficulty    DASH Score  13.64 %       Significant axillary cording present:                  PT Education - 12/13/18 1145    Education Details  Reviewed HEP given pre-operatively as she was not doing those. Encouraged her to begin doing those as instructed.    Person(s) Educated  Patient    Methods  Explanation;Demonstration;Handout    Comprehension  Returned demonstration;Verbalized understanding          PT Long Term Goals - 12/13/18 1151      PT LONG TERM GOAL #1   Title  Patient will demonstrate she has regained full shoulder ROM and function compared to baseline measurements.    Time  8    Status  Partially Met            Plan - 12/13/18 1146    Clinical Impression Statement  Patient is 1 month s/p left mastectomy with a targeted axillary node dissection. She had 1/7 nodes positive and will begin chemotherapy on 12/17/2018 followed by radiation and anti-estrogen therapy. She is very anxious and sad about beginning chemotherapy so we spent time discussing ways to cope including daily walks. She has limited left shoulder ROM and significant cording present in her left axilla. She will benefit from PT to address those deficits but because she starts chemo in a few days, she would like to try to HEP on her own and contact me if things do not improve. This seems reasonable and PT is in agreement.    Rehab Potential  Excellent    Clinical  Impairments Affecting Rehab Potential  Begins chemo 12/17/2018    PT Treatment/Interventions  ADLs/Self Care Home Management;Patient/family education;Therapeutic exercise;Manual lymph drainage;Scar mobilization;Passive range of motion;Manual techniques;DME Instruction;Therapeutic activities    PT Next Visit Plan  Will write goals and determine frequency and duration if pt decides to begin PT.    PT Home Exercise Plan  Post op shoulder ROM HEP       Patient will benefit from skilled therapeutic intervention in order to improve the following deficits and impairments:  Postural dysfunction, Decreased knowledge of precautions, Pain, Impaired UE functional use, Decreased range of motion, Increased fascial restricitons  Visit Diagnosis: Malignant neoplasm of upper-outer quadrant of left breast in female, estrogen receptor positive (HCC)  Stiffness of left shoulder, not elsewhere classified  Aftercare following surgery for neoplasm     Problem List Patient Active Problem List   Diagnosis Date Noted   Elevated hemoglobin A1c 12/07/2018   Vitamin D deficiency 12/07/2018   Hypercalcemia 12/07/2018   Breast cancer, left ([place]) 11/13/2018   Family history of breast  cancer    Malignant neoplasm of upper-outer quadrant of left breast in female, estrogen receptor positive ([place]) 09/17/2018   Breast mass in female 09/04/2018   Conjunctival irritation 08/16/2017   Annual physical exam 04/13/2016   Cardiac risk counseling 04/13/2016   Failure to attend appointment 04/06/2016   Essential hypertension 03/23/2016   Annia Friendly, PT 12/13/18 11:52 AM  [place], [place], [address] Phone: [phone]   Fax:  [phone]  Name: Heather [name] MRN: [id number] Date of Birth: 02-Feb-1961

## 2018-12-16 NOTE — Assessment & Plan Note (Signed)
11/13/2018: Left mastectomy: Grade 2 IDC, 1.6 cm, with intermediate grade DCIS, margins negative, lymphovascular invasion present, 1/7 lymph nodes positive, MammaPrint high risk, ER 100%, PR 30%, HER-2 negative, Ki-67 30% T1CN1A stage Ia  Treatment plan: 1.  Adjuvant chemotherapy with dose since he remains on Cytoxan x4 followed by Taxol weekly x12 2.  Followed by adjuvant radiation therapy 3.  Followed by adjuvant antiestrogen therapy ------------------------------------------------------------------------------------------------------------------ Current Treatment: Today is cycle 1 day 1 DD AC Labs are reviewed Chemo consent obtained Anti emetics reviewed  RTC in 1 week for tox check

## 2018-12-17 ENCOUNTER — Inpatient Hospital Stay (HOSPITAL_BASED_OUTPATIENT_CLINIC_OR_DEPARTMENT_OTHER): Payer: BC Managed Care – PPO | Admitting: Hematology and Oncology

## 2018-12-17 ENCOUNTER — Inpatient Hospital Stay: Payer: BC Managed Care – PPO

## 2018-12-17 ENCOUNTER — Other Ambulatory Visit: Payer: Self-pay

## 2018-12-17 DIAGNOSIS — R450 Nervousness: Secondary | ICD-10-CM | POA: Diagnosis not present

## 2018-12-17 DIAGNOSIS — Z170 Estrogen receptor positive status [ER+]: Principal | ICD-10-CM

## 2018-12-17 DIAGNOSIS — C50412 Malignant neoplasm of upper-outer quadrant of left female breast: Secondary | ICD-10-CM

## 2018-12-17 DIAGNOSIS — Z79899 Other long term (current) drug therapy: Secondary | ICD-10-CM

## 2018-12-17 DIAGNOSIS — R110 Nausea: Secondary | ICD-10-CM | POA: Diagnosis not present

## 2018-12-17 DIAGNOSIS — D6481 Anemia due to antineoplastic chemotherapy: Secondary | ICD-10-CM | POA: Diagnosis not present

## 2018-12-17 DIAGNOSIS — Z5111 Encounter for antineoplastic chemotherapy: Secondary | ICD-10-CM | POA: Diagnosis present

## 2018-12-17 DIAGNOSIS — Z5189 Encounter for other specified aftercare: Secondary | ICD-10-CM | POA: Diagnosis present

## 2018-12-17 LAB — CBC WITH DIFFERENTIAL (CANCER CENTER ONLY)
Abs Immature Granulocytes: 0.04 10*3/uL (ref 0.00–0.07)
Basophils Absolute: 0 10*3/uL (ref 0.0–0.1)
Basophils Relative: 0 %
Eosinophils Absolute: 0.1 10*3/uL (ref 0.0–0.5)
Eosinophils Relative: 0 %
HCT: 37.4 % (ref 36.0–46.0)
HEMOGLOBIN: 11.8 g/dL — AB (ref 12.0–15.0)
Immature Granulocytes: 0 %
Lymphocytes Relative: 9 %
Lymphs Abs: 1 10*3/uL (ref 0.7–4.0)
MCH: 26.9 pg (ref 26.0–34.0)
MCHC: 31.6 g/dL (ref 30.0–36.0)
MCV: 85.2 fL (ref 80.0–100.0)
Monocytes Absolute: 0.6 10*3/uL (ref 0.1–1.0)
Monocytes Relative: 5 %
Neutro Abs: 9.9 10*3/uL — ABNORMAL HIGH (ref 1.7–7.7)
Neutrophils Relative %: 86 %
Platelet Count: 368 10*3/uL (ref 150–400)
RBC: 4.39 MIL/uL (ref 3.87–5.11)
RDW: 14.2 % (ref 11.5–15.5)
WBC Count: 11.7 10*3/uL — ABNORMAL HIGH (ref 4.0–10.5)
nRBC: 0 % (ref 0.0–0.2)

## 2018-12-17 LAB — CMP (CANCER CENTER ONLY)
ALT: 10 U/L (ref 0–44)
AST: 22 U/L (ref 15–41)
Albumin: 3.9 g/dL (ref 3.5–5.0)
Alkaline Phosphatase: 68 U/L (ref 38–126)
Anion gap: 12 (ref 5–15)
BUN: 16 mg/dL (ref 6–20)
CO2: 25 mmol/L (ref 22–32)
Calcium: 9.4 mg/dL (ref 8.9–10.3)
Chloride: 107 mmol/L (ref 98–111)
Creatinine: 0.82 mg/dL (ref 0.44–1.00)
GFR, Est AFR Am: >60 mL/min (ref >60)
GFR, Estimated: >60 mL/min (ref >60)
GLUCOSE: 104 mg/dL — AB (ref 70–99)
Potassium: 3.8 mmol/L (ref 3.5–5.1)
Sodium: 144 mmol/L (ref 135–145)
Total Bilirubin: 0.6 mg/dL (ref 0.3–1.2)
Total Protein: 6.7 g/dL (ref 6.5–8.1)

## 2018-12-17 MED ORDER — DOXORUBICIN HCL CHEMO IV INJECTION 2 MG/ML
60.0000 mg/m2 | Freq: Once | INTRAVENOUS | Status: AC
  Administered 2018-12-17: 92 mg via INTRAVENOUS
  Filled 2018-12-17: qty 46

## 2018-12-17 MED ORDER — PALONOSETRON HCL INJECTION 0.25 MG/5ML
INTRAVENOUS | Status: AC
  Filled 2018-12-17: qty 5

## 2018-12-17 MED ORDER — HEPARIN SOD (PORK) LOCK FLUSH 100 UNIT/ML IV SOLN
500.0000 [IU] | Freq: Once | INTRAVENOUS | Status: AC | PRN
  Administered 2018-12-17: 500 [IU]
  Filled 2018-12-17: qty 5

## 2018-12-17 MED ORDER — PALONOSETRON HCL INJECTION 0.25 MG/5ML
0.2500 mg | Freq: Once | INTRAVENOUS | Status: AC
  Administered 2018-12-17: 0.25 mg via INTRAVENOUS

## 2018-12-17 MED ORDER — SODIUM CHLORIDE 0.9% FLUSH
10.0000 mL | INTRAVENOUS | Status: DC | PRN
  Administered 2018-12-17: 10 mL
  Filled 2018-12-17: qty 10

## 2018-12-17 MED ORDER — SODIUM CHLORIDE 0.9 % IV SOLN
Freq: Once | INTRAVENOUS | Status: AC
  Administered 2018-12-17: 11:00:00 via INTRAVENOUS
  Filled 2018-12-17: qty 250

## 2018-12-17 MED ORDER — SODIUM CHLORIDE 0.9 % IV SOLN
600.0000 mg/m2 | Freq: Once | INTRAVENOUS | Status: AC
  Administered 2018-12-17: 920 mg via INTRAVENOUS
  Filled 2018-12-17: qty 46

## 2018-12-17 MED ORDER — SODIUM CHLORIDE 0.9 % IV SOLN
Freq: Once | INTRAVENOUS | Status: AC
  Administered 2018-12-17: 11:00:00 via INTRAVENOUS
  Filled 2018-12-17: qty 5

## 2018-12-17 MED ORDER — SODIUM CHLORIDE 0.9% FLUSH
10.0000 mL | INTRAVENOUS | Status: AC | PRN
  Administered 2018-12-17: 10 mL via INTRAVENOUS
  Filled 2018-12-17: qty 10

## 2018-12-17 NOTE — Progress Notes (Signed)
Patient Care Team: Emeterio Reeve, DO as PCP - General (Osteopathic Medicine) Rolm Bookbinder, MD as Consulting Physician (General Surgery) Nicholas Lose, MD as Consulting Physician (Hematology and Oncology) Eppie Gibson, MD as Attending Physician (Radiation Oncology)  DIAGNOSIS:  Encounter Diagnosis  Name Primary?  . Malignant neoplasm of upper-outer quadrant of left breast in female, estrogen receptor positive ([place])     SUMMARY OF ONCOLOGIC HISTORY:   Malignant neoplasm of upper-outer quadrant of left breast in female, estrogen receptor positive ([place])   09/12/2018 Initial Diagnosis    Palpable left breast mass at 2:30 position by ultrasound measured 1.6 cm, calcifications spanning 5.9 cm, 3 abnormal lymph nodes, multiple masses and calcifications biopsy of the mass revealed grade 2 IDC plus DCIS with lymphovascular invasion, ER 100%, PR 60%, Ki-67 30%, HER-2 negative IHC 1+, pathology of calcifications is pending, biopsy of lymph nodes is discordant benign, T1cN0 stage Ia    09/19/2018 Cancer Staging    Staging form: Breast, AJCC 8th Edition - Clinical stage from 09/19/2018: Stage IA (cT1c(m), cN0, cM0, G2, ER+, PR+, HER2-) - Signed by Nicholas Lose, MD on 09/19/2018    11/13/2018 Surgery    Left mastectomy: Grade 2 IDC, 1.6 cm, with intermediate grade DCIS, margins negative, lymphovascular invasion present, 1/7 lymph nodes positive, MammaPrint high risk, ER 100%, PR 30%, HER-2 negative, Ki-67 30% T1CN1A stage Ia    11/13/2018 Cancer Staging    Staging form: Breast, AJCC 8th Edition - Pathologic stage from 11/13/2018: Stage IA (pT1c, pN1a, cM0, G2, ER+, PR+, HER2-) - Signed by Gardenia Phlegm, NP on 11/28/2018    12/17/2018 -  Chemotherapy    The patient had DOXOrubicin (ADRIAMYCIN) chemo injection 92 mg, 60 mg/m2 = 92 mg, Intravenous,  Once, 0 of 4 cycles palonosetron (ALOXI) injection 0.25 mg, 0.25 mg, Intravenous,  Once, 0 of 4 cycles pegfilgrastim-cbqv (UDENYCA)  injection 6 mg, 6 mg, Subcutaneous, Once, 0 of 4 cycles cyclophosphamide (CYTOXAN) 920 mg in sodium chloride 0.9 % 250 mL chemo infusion, 600 mg/m2 = 920 mg, Intravenous,  Once, 0 of 4 cycles PACLitaxel (TAXOL) 120 mg in sodium chloride 0.9 % 250 mL chemo infusion (</= '80mg'$ /m2), 80 mg/m2 = 120 mg, Intravenous,  Once, 0 of 12 cycles fosaprepitant (EMEND) 150 mg, dexamethasone (DECADRON) 12 mg in sodium chloride 0.9 % 145 mL IVPB, , Intravenous,  Once, 0 of 4 cycles  for chemotherapy treatment.      CHIEF COMPLIANT: Cycle 1 day 1 dose dense Adriamycin and Cytoxan  INTERVAL HISTORY: Heather [name] is a 58 year old with above-mentioned history of left breast cancer treated mastectomy and is here today to begin her first cycle of adjuvant chemotherapy with dose dense Adriamycin and Cytoxan.  She is very anxious and nervous.  She is healing very well from prior surgeries.  REVIEW OF SYSTEMS:   Constitutional: Denies fevers, chills or abnormal weight loss Eyes: Denies blurriness of vision Ears, nose, mouth, throat, and face: Denies mucositis or sore throat Respiratory: Denies cough, dyspnea or wheezes Cardiovascular: Denies palpitation, chest discomfort Gastrointestinal:  Denies nausea, heartburn or change in bowel habits Skin: Denies abnormal skin rashes Lymphatics: Denies new lymphadenopathy or easy bruising Neurological:Denies numbness, tingling or new weaknesses Behavioral/Psych: Mood is stable, no new changes  Extremities: No lower extremity edema   All other systems were reviewed with the patient and are negative.  I have reviewed the past medical history, past surgical history, social history and family history with the patient and they are unchanged from previous  note.  ALLERGIES:  has No Known Allergies.  MEDICATIONS:  Current Outpatient Medications  Medication Sig Dispense Refill  . dexamethasone (DECADRON) 4 MG tablet Take 1 tablet day after chemo and 1 tablet 2 days after  chemo with food 8 tablet 0  . lidocaine-prilocaine (EMLA) cream Apply 1 application topically as needed. 30 g 0  . lisinopril-hydrochlorothiazide (PRINZIDE,ZESTORETIC) 10-12.5 MG tablet TAKE 1 TABLET BY MOUTH EVERY DAY 90 tablet 0  . LORazepam (ATIVAN) 0.5 MG tablet Take 1 tablet (0.5 mg total) by mouth at bedtime as needed (Nausea or vomiting). 30 tablet 0  . ondansetron (ZOFRAN) 8 MG tablet Take 1 tablet (8 mg total) by mouth 2 (two) times daily as needed. Start on the third day after chemotherapy. (Patient not taking: Reported on 12/04/2018) 30 tablet 1  . prochlorperazine (COMPAZINE) 10 MG tablet Take 1 tablet (10 mg total) by mouth every 6 (six) hours as needed (Nausea or vomiting). (Patient not taking: Reported on 12/04/2018) 30 tablet 1  . Vitamin D, Ergocalciferol, (DRISDOL) 1.25 MG (50000 UT) CAPS capsule Take 1 capsule (50,000 Units total) by mouth every 7 (seven) days. Take for 12 total doses(weeks) than can transition to 1000 units OTC supplement daily 12 capsule 0   No current facility-administered medications for this visit.    Facility-Administered Medications Ordered in Other Visits  Medication Dose Route Frequency Provider Last Rate Last Dose  . sodium chloride flush (NS) 0.9 % injection 10 mL  10 mL Intravenous PRN Nicholas Lose, MD   10 mL at 12/17/18 0907    PHYSICAL EXAMINATION: ECOG PERFORMANCE STATUS: 1 - Symptomatic but completely ambulatory  Vitals:   12/17/18 0943  BP: (!) 148/87  Pulse: 91  Resp: 17  Temp: 98.4 F (36.9 C)  SpO2: 100%   Filed Weights   12/17/18 0943  Weight: 106 lb 12.8 oz (48.4 kg)    GENERAL:alert, no distress and comfortable SKIN: skin color, texture, turgor are normal, no rashes or significant lesions EYES: normal, Conjunctiva are pink and non-injected, sclera clear OROPHARYNX:no exudate, no erythema and lips, buccal mucosa, and tongue normal  NECK: supple, thyroid normal size, non-tender, without nodularity LYMPH:  no palpable  lymphadenopathy in the cervical, axillary or inguinal LUNGS: clear to auscultation and percussion with normal breathing effort HEART: regular rate & rhythm and no murmurs and no lower extremity edema ABDOMEN:abdomen soft, non-tender and normal bowel sounds MUSCULOSKELETAL:no cyanosis of digits and no clubbing  NEURO: alert & oriented x 3 with fluent speech, no focal motor/sensory deficits EXTREMITIES: No lower extremity edema   LABORATORY DATA:  I have reviewed the data as listed CMP Latest Ref Rng & Units 12/04/2018 11/09/2018 09/19/2018  Glucose 65 - 99 mg/dL 132(H) 129(H) 146(H)  BUN 7 - 25 mg/dL 13 16 12   Creatinine 0.50 - 1.05 mg/dL 0.91 0.94 0.88  Sodium 135 - 146 mmol/L 141 143 139  Potassium 3.5 - 5.3 mmol/L 3.7 4.8 3.9  Chloride 98 - 110 mmol/L 100 108 103  CO2 20 - 32 mmol/L 31 24 26   Calcium 8.6 - 10.4 mg/dL 10.6(H) 9.5 9.7  Total Protein 6.1 - 8.1 g/dL 7.1 - 7.0  Total Bilirubin 0.2 - 1.2 mg/dL 0.6 - 0.6  Alkaline Phos 38 - 126 U/L - - 68  AST 10 - 35 U/L 22 - 26  ALT 6 - 29 U/L 12 - 15    Lab Results  Component Value Date   WBC 11.7 (H) 12/17/2018   HGB 11.8 (L) 12/17/2018  HCT 37.4 12/17/2018   MCV 85.2 12/17/2018   PLT 368 12/17/2018   NEUTROABS 9.9 (H) 12/17/2018    ASSESSMENT & PLAN:  Malignant neoplasm of upper-outer quadrant of left breast in female, estrogen receptor positive ([place]) 11/13/2018: Left mastectomy: Grade 2 IDC, 1.6 cm, with intermediate grade DCIS, margins negative, lymphovascular invasion present, 1/7 lymph nodes positive, MammaPrint high risk, ER 100%, PR 30%, HER-2 negative, Ki-67 30% T1CN1A stage Ia  Treatment plan: 1.  Adjuvant chemotherapy with dose since he remains on Cytoxan x4 followed by Taxol weekly x12 2.  Followed by adjuvant radiation therapy 3.  Followed by adjuvant antiestrogen therapy ------------------------------------------------------------------------------------------------------------------ Current Treatment: Today is  cycle 1 day 1 DD AC Labs are reviewed Chemo consent obtained Anti emetics reviewed  RTC in 1 week for tox check       No orders of the defined types were placed in this encounter.  The patient has a good understanding of the overall plan. she agrees with it. she will call with any problems that may develop before the next visit here.   Harriette Ohara, MD 12/17/18

## 2018-12-17 NOTE — Patient Instructions (Signed)
[place] Discharge Instructions for Patients Receiving Chemotherapy  Today you received the following chemotherapy agents Adriamycin, Cytoxan  To help prevent nausea and vomiting after your treatment, we encourage you to take your nausea medication as prescribed.   If you develop nausea and vomiting that is not controlled by your nausea medication, call the clinic.   BELOW ARE SYMPTOMS THAT SHOULD BE REPORTED IMMEDIATELY:  *FEVER GREATER THAN 100.5 F  *CHILLS WITH OR WITHOUT FEVER  NAUSEA AND VOMITING THAT IS NOT CONTROLLED WITH YOUR NAUSEA MEDICATION  *UNUSUAL SHORTNESS OF BREATH  *UNUSUAL BRUISING OR BLEEDING  TENDERNESS IN MOUTH AND THROAT WITH OR WITHOUT PRESENCE OF ULCERS  *URINARY PROBLEMS  *BOWEL PROBLEMS  UNUSUAL RASH Items with * indicate a potential emergency and should be followed up as soon as possible.  Feel free to call the clinic should you have any questions or concerns. The clinic phone number is [phone].  Please show the [place] at check-in to the Emergency Department and triage nurse.  (Adriamycin) Doxorubicin injection What is this medicine? DOXORUBICIN (dox oh ROO bi sin) is a chemotherapy drug. It is used to treat many kinds of cancer like leukemia, lymphoma, neuroblastoma, sarcoma, and Wilms' tumor. It is also used to treat bladder cancer, breast cancer, lung cancer, ovarian cancer, stomach cancer, and thyroid cancer. This medicine may be used for other purposes; ask your health care provider or pharmacist if you have questions. COMMON BRAND NAME(S): Adriamycin, Adriamycin PFS, Adriamycin RDF, Rubex What should I tell my health care provider before I take this medicine? They need to know if you have any of these conditions: -heart disease -history of low blood counts caused by a medicine -liver disease -recent or ongoing radiation therapy -an unusual or allergic reaction to doxorubicin, other chemotherapy agents,  other medicines, foods, dyes, or preservatives -pregnant or trying to get pregnant -breast-feeding How should I use this medicine? This drug is given as an infusion into a vein. It is administered in a hospital or clinic by a specially trained health care professional. If you have pain, swelling, burning or any unusual feeling around the site of your injection, tell your health care professional right away. Talk to your pediatrician regarding the use of this medicine in children. Special care may be needed. Overdosage: If you think you have taken too much of this medicine contact a poison control center or emergency room at once. NOTE: This medicine is only for you. Do not share this medicine with others. What if I miss a dose? It is important not to miss your dose. Call your doctor or health care professional if you are unable to keep an appointment. What may interact with this medicine? This medicine may interact with the following medications: -6-mercaptopurine -paclitaxel -phenytoin -St. John's Wort -trastuzumab -verapamil This list may not describe all possible interactions. Give your health care provider a list of all the medicines, herbs, non-prescription drugs, or dietary supplements you use. Also tell them if you smoke, drink alcohol, or use illegal drugs. Some items may interact with your medicine. What should I watch for while using this medicine? This drug may make you feel generally unwell. This is not uncommon, as chemotherapy can affect healthy cells as well as cancer cells. Report any side effects. Continue your course of treatment even though you feel ill unless your doctor tells you to stop. There is a maximum amount of this medicine you should receive throughout your life. The amount depends on  the medical condition being treated and your overall health. Your doctor will watch how much of this medicine you receive in your lifetime. Tell your doctor if you have taken this  medicine before. You may need blood work done while you are taking this medicine. Your urine may turn red for a few days after your dose. This is not blood. If your urine is dark or brown, call your doctor. In some cases, you may be given additional medicines to help with side effects. Follow all directions for their use. Call your doctor or health care professional for advice if you get a fever, chills or sore throat, or other symptoms of a cold or flu. Do not treat yourself. This drug decreases your body's ability to fight infections. Try to avoid being around people who are sick. This medicine may increase your risk to bruise or bleed. Call your doctor or health care professional if you notice any unusual bleeding. Talk to your doctor about your risk of cancer. You may be more at risk for certain types of cancers if you take this medicine. Do not become pregnant while taking this medicine or for 6 months after stopping it. Women should inform their doctor if they wish to become pregnant or think they might be pregnant. Men should not father a child while taking this medicine and for 6 months after stopping it. There is a potential for serious side effects to an unborn child. Talk to your health care professional or pharmacist for more information. Do not breast-feed an infant while taking this medicine. This medicine has caused ovarian failure in some women and reduced sperm counts in some men This medicine may interfere with the ability to have a child. Talk with your doctor or health care professional if you are concerned about your fertility. This medicine may cause a decrease in Co-Enzyme Q-10. You should make sure that you get enough Co-Enzyme Q-10 while you are taking this medicine. Discuss the foods you eat and the vitamins you take with your health care professional. What side effects may I notice from receiving this medicine? Side effects that you should report to your doctor or health care  professional as soon as possible: -allergic reactions like skin rash, itching or hives, swelling of the face, lips, or tongue -breathing problems -chest pain -fast or irregular heartbeat -low blood counts - this medicine may decrease the number of white blood cells, red blood cells and platelets. You may be at increased risk for infections and bleeding. -pain, redness, or irritation at site where injected -signs of infection - fever or chills, cough, sore throat, pain or difficulty passing urine -signs of decreased platelets or bleeding - bruising, pinpoint red spots on the skin, black, tarry stools, blood in the urine -swelling of the ankles, feet, hands -tiredness -weakness Side effects that usually do not require medical attention (report to your doctor or health care professional if they continue or are bothersome): -diarrhea -hair loss -mouth sores -nail discoloration or damage -nausea -red colored urine -vomiting This list may not describe all possible side effects. Call your doctor for medical advice about side effects. You may report side effects to FDA at 1-800-FDA-1088. Where should I keep my medicine? This drug is given in a hospital or clinic and will not be stored at home. NOTE: This sheet is a summary. It may not cover all possible information. If you have questions about this medicine, talk to your doctor, pharmacist, or health care provider.  2019  Elsevier/Gold Standard (2017-05-03 11:01:26)  (Cxytoxan) Cyclophosphamide injection What is this medicine? CYCLOPHOSPHAMIDE (sye kloe FOSS fa mide) is a chemotherapy drug. It slows the growth of cancer cells. This medicine is used to treat many types of cancer like lymphoma, myeloma, leukemia, breast cancer, and ovarian cancer, to name a few. This medicine may be used for other purposes; ask your health care provider or pharmacist if you have questions. COMMON BRAND NAME(S): Cytoxan, Neosar What should I tell my health care  provider before I take this medicine? They need to know if you have any of these conditions: -blood disorders -history of other chemotherapy -infection -kidney disease -liver disease -recent or ongoing radiation therapy -tumors in the bone marrow -an unusual or allergic reaction to cyclophosphamide, other chemotherapy, other medicines, foods, dyes, or preservatives -pregnant or trying to get pregnant -breast-feeding How should I use this medicine? This drug is usually given as an injection into a vein or muscle or by infusion into a vein. It is administered in a hospital or clinic by a specially trained health care professional. Talk to your pediatrician regarding the use of this medicine in children. Special care may be needed. Overdosage: If you think you have taken too much of this medicine contact a poison control center or emergency room at once. NOTE: This medicine is only for you. Do not share this medicine with others. What if I miss a dose? It is important not to miss your dose. Call your doctor or health care professional if you are unable to keep an appointment. What may interact with this medicine? This medicine may interact with the following medications: -amiodarone -amphotericin B -azathioprine -certain antiviral medicines for HIV or AIDS such as protease inhibitors (e.g., indinavir, ritonavir) and zidovudine -certain blood pressure medications such as benazepril, captopril, enalapril, fosinopril, lisinopril, moexipril, monopril, perindopril, quinapril, ramipril, trandolapril -certain cancer medications such as anthracyclines (e.g., daunorubicin, doxorubicin), busulfan, cytarabine, paclitaxel, pentostatin, tamoxifen, trastuzumab -certain diuretics such as chlorothiazide, chlorthalidone, hydrochlorothiazide, indapamide, metolazone -certain medicines that treat or prevent blood clots like warfarin -certain muscle relaxants such as  succinylcholine -cyclosporine -etanercept -indomethacin -medicines to increase blood counts like filgrastim, pegfilgrastim, sargramostim -medicines used as general anesthesia -metronidazole -natalizumab This list may not describe all possible interactions. Give your health care provider a list of all the medicines, herbs, non-prescription drugs, or dietary supplements you use. Also tell them if you smoke, drink alcohol, or use illegal drugs. Some items may interact with your medicine. What should I watch for while using this medicine? Visit your doctor for checks on your progress. This drug may make you feel generally unwell. This is not uncommon, as chemotherapy can affect healthy cells as well as cancer cells. Report any side effects. Continue your course of treatment even though you feel ill unless your doctor tells you to stop. Drink water or other fluids as directed. Urinate often, even at night. In some cases, you may be given additional medicines to help with side effects. Follow all directions for their use. Call your doctor or health care professional for advice if you get a fever, chills or sore throat, or other symptoms of a cold or flu. Do not treat yourself. This drug decreases your body's ability to fight infections. Try to avoid being around people who are sick. This medicine may increase your risk to bruise or bleed. Call your doctor or health care professional if you notice any unusual bleeding. Be careful brushing and flossing your teeth or using a toothpick because you  get an infection or bleed more easily. If you have any dental work done, tell your dentist you are receiving this medicine. You may get drowsy or dizzy. Do not drive, use machinery, or do anything that needs mental alertness until you know how this medicine affects you. Do not become pregnant while taking this medicine or for 1 year after stopping it. Women should inform their doctor if they wish to become  pregnant or think they might be pregnant. Men should not father a child while taking this medicine and for 4 months after stopping it. There is a potential for serious side effects to an unborn child. Talk to your health care professional or pharmacist for more information. Do not breast-feed an infant while taking this medicine. This medicine may interfere with the ability to have a child. This medicine has caused ovarian failure in some women. This medicine has caused reduced sperm counts in some men. You should talk with your doctor or health care professional if you are concerned about your fertility. If you are going to have surgery, tell your doctor or health care professional that you have taken this medicine. What side effects may I notice from receiving this medicine? Side effects that you should report to your doctor or health care professional as soon as possible: -allergic reactions like skin rash, itching or hives, swelling of the face, lips, or tongue -low blood counts - this medicine may decrease the number of white blood cells, red blood cells and platelets. You may be at increased risk for infections and bleeding. -signs of infection - fever or chills, cough, sore throat, pain or difficulty passing urine -signs of decreased platelets or bleeding - bruising, pinpoint red spots on the skin, black, tarry stools, blood in the urine -signs of decreased red blood cells - unusually weak or tired, fainting spells, lightheadedness -breathing problems -dark urine -dizziness -palpitations -swelling of the ankles, feet, hands -trouble passing urine or change in the amount of urine -weight gain -yellowing of the eyes or skin Side effects that usually do not require medical attention (report to your doctor or health care professional if they continue or are bothersome): -changes in nail or skin color -hair loss -missed menstrual periods -mouth sores -nausea, vomiting This list may not  describe all possible side effects. Call your doctor for medical advice about side effects. You may report side effects to FDA at 1-800-FDA-1088. Where should I keep my medicine? This drug is given in a hospital or clinic and will not be stored at home. NOTE: This sheet is a summary. It may not cover all possible information. If you have questions about this medicine, talk to your doctor, pharmacist, or health care provider.  2019 Elsevier/Gold Standard (2012-08-03 16:22:58)     

## 2018-12-18 ENCOUNTER — Encounter: Payer: Self-pay | Admitting: *Deleted

## 2018-12-19 ENCOUNTER — Inpatient Hospital Stay: Payer: BC Managed Care – PPO

## 2018-12-19 ENCOUNTER — Other Ambulatory Visit: Payer: Self-pay

## 2018-12-19 VITALS — BP 128/64 | HR 74 | Temp 98.4°F | Resp 18

## 2018-12-19 DIAGNOSIS — Z170 Estrogen receptor positive status [ER+]: Principal | ICD-10-CM

## 2018-12-19 DIAGNOSIS — Z5111 Encounter for antineoplastic chemotherapy: Secondary | ICD-10-CM | POA: Diagnosis not present

## 2018-12-19 DIAGNOSIS — C50412 Malignant neoplasm of upper-outer quadrant of left female breast: Secondary | ICD-10-CM

## 2018-12-19 MED ORDER — PEGFILGRASTIM-CBQV 6 MG/0.6ML ~~LOC~~ SOSY
PREFILLED_SYRINGE | SUBCUTANEOUS | Status: AC
  Filled 2018-12-19: qty 0.6

## 2018-12-19 MED ORDER — PEGFILGRASTIM-CBQV 6 MG/0.6ML ~~LOC~~ SOSY
6.0000 mg | PREFILLED_SYRINGE | Freq: Once | SUBCUTANEOUS | Status: AC
  Administered 2018-12-19: 6 mg via SUBCUTANEOUS

## 2018-12-19 NOTE — Progress Notes (Signed)
Patient Care Team: Emeterio Reeve, DO as PCP - General (Osteopathic Medicine) Rolm Bookbinder, MD as Consulting Physician (General Surgery) Nicholas Lose, MD as Consulting Physician (Hematology and Oncology) Eppie Gibson, MD as Attending Physician (Radiation Oncology)  DIAGNOSIS:    ICD-10-CM   1. Malignant neoplasm of upper-outer quadrant of left breast in female, estrogen receptor positive ([place]) C50.412    Z17.0     SUMMARY OF ONCOLOGIC HISTORY:   Malignant neoplasm of upper-outer quadrant of left breast in female, estrogen receptor positive (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, ER 100%, PR 60%, Ki-67 30%, HER-2 negative IHC 1+, pathology of calcifications is pending, biopsy of lymph nodes is discordant benign, T1cN0 stage Ia    09/19/2018 Cancer Staging    Staging form: Breast, AJCC 8th Edition - Clinical stage from 09/19/2018: Stage IA (cT1c(m), cN0, cM0, G2, ER+, PR+, HER2-) - Signed by Nicholas Lose, MD on 09/19/2018    11/13/2018 Surgery    Left mastectomy: Grade 2 IDC, 1.6 cm, with intermediate grade DCIS, margins negative, lymphovascular invasion present, 1/7 lymph nodes positive, MammaPrint high risk, ER 100%, PR 30%, HER-2 negative, Ki-67 30% T1CN1A stage Ia    11/13/2018 Cancer Staging    Staging form: Breast, AJCC 8th Edition - Pathologic stage from 11/13/2018: Stage IA (pT1c, pN1a, cM0, G2, ER+, PR+, HER2-) - Signed by Gardenia Phlegm, NP on 11/28/2018    12/17/2018 -  Chemotherapy    The patient had DOXOrubicin (ADRIAMYCIN) chemo injection 92 mg, 60 mg/m2 = 92 mg, Intravenous,  Once, 1 of 4 cycles Administration: 92 mg (12/17/2018) palonosetron (ALOXI) injection 0.25 mg, 0.25 mg, Intravenous,  Once, 1 of 4 cycles  Administration: 0.25 mg (12/17/2018) pegfilgrastim-cbqv (UDENYCA) injection 6 mg, 6 mg, Subcutaneous, Once, 1 of 4 cycles Administration: 6 mg (12/19/2018) cyclophosphamide (CYTOXAN) 920 mg in sodium chloride 0.9 % 250 mL chemo infusion, 600 mg/m2 = 920 mg, Intravenous,  Once, 1 of 4 cycles Administration: 920 mg (12/17/2018) PACLitaxel (TAXOL) 120 mg in sodium chloride 0.9 % 250 mL chemo infusion (</= 68m/m2), 80 mg/m2 = 120 mg, Intravenous,  Once, 0 of 12 cycles fosaprepitant (EMEND) 150 mg, dexamethasone (DECADRON) 12 mg in sodium chloride 0.9 % 145 mL IVPB, , Intravenous,  Once, 1 of 4 cycles Administration:  (12/17/2018)  for chemotherapy treatment.      CHIEF COMPLIANT: Cycle 1 Day 8 Adriamycin and Cytoxan  INTERVAL HISTORY: Heather [name] a 58y.o. with above-mentioned history of left breast cancer treated with mastectomy who is currently on adjuvant chemotherapy with dose dense Adriamycin and Cytoxan. She presents to the clinic today for toxicity evaluation after undergoing first cycle of chemotherapy.  Apart from mild nausea she did not have any major side effects.  She is also complaining of vaginal dryness and itching sensation.  She does not have any discharge or foul odor.  REVIEW OF SYSTEMS:   Constitutional: Denies fevers, chills or abnormal weight loss Eyes: Denies blurriness of vision Ears, nose, mouth, throat, and face: Denies mucositis or sore throat Respiratory: Denies cough, dyspnea or wheezes Cardiovascular: Denies palpitation, chest discomfort Gastrointestinal: Denies nausea, heartburn or change in bowel habits Skin: Denies abnormal skin rashes Lymphatics: Denies new lymphadenopathy or easy bruising Neurological: Denies numbness, tingling or new weaknesses Behavioral/Psych:  Mood is stable, no new changes  Extremities: No lower extremity edema Breast: denies any pain or lumps or nodules in either breasts All other systems were reviewed with the patient and  are negative.  I have reviewed the past medical history, past surgical history, social history and family history with the patient and they are unchanged from previous note.  ALLERGIES:  has No Known Allergies.  MEDICATIONS:  Current Outpatient Medications  Medication Sig Dispense Refill  . dexamethasone (DECADRON) 4 MG tablet Take 1 tablet day after chemo and 1 tablet 2 days after chemo with food 8 tablet 0  . lidocaine-prilocaine (EMLA) cream Apply 1 application topically as needed. 30 g 0  . lisinopril-hydrochlorothiazide (PRINZIDE,ZESTORETIC) 10-12.5 MG tablet TAKE 1 TABLET BY MOUTH EVERY DAY 90 tablet 0  . LORazepam (ATIVAN) 0.5 MG tablet Take 1 tablet (0.5 mg total) by mouth at bedtime as needed (Nausea or vomiting). 30 tablet 0  . ondansetron (ZOFRAN) 8 MG tablet Take 1 tablet (8 mg total) by mouth 2 (two) times daily as needed. Start on the third day after chemotherapy. (Patient not taking: Reported on 12/04/2018) 30 tablet 1  . prochlorperazine (COMPAZINE) 10 MG tablet Take 1 tablet (10 mg total) by mouth every 6 (six) hours as needed (Nausea or vomiting). (Patient not taking: Reported on 12/04/2018) 30 tablet 1  . Vitamin D, Ergocalciferol, (DRISDOL) 1.25 MG (50000 UT) CAPS capsule Take 1 capsule (50,000 Units total) by mouth every 7 (seven) days. Take for 12 total doses(weeks) than can transition to 1000 units OTC supplement daily 12 capsule 0   No current facility-administered medications for this visit.    Facility-Administered Medications Ordered in Other Visits  Medication Dose Route Frequency Provider Last Rate Last Dose  . sodium chloride flush (NS) 0.9 % injection 10 mL  10 mL Intravenous PRN Nicholas Lose, MD   10 mL at 12/17/18 0907    PHYSICAL EXAMINATION: ECOG PERFORMANCE STATUS: 1 - Symptomatic but completely ambulatory  Vitals:   12/24/18 0926  BP: 115/85  Pulse: 91  Resp: 18  Temp: 98.2 F (36.8 C)  SpO2: 100%   Filed Weights   12/24/18 0926  Weight: 110 lb  (49.9 kg)    GENERAL: alert, no distress and comfortable SKIN: skin color, texture, turgor are normal, no rashes or significant lesions EYES: normal, Conjunctiva are pink and non-injected, sclera clear OROPHARYNX: no exudate, no erythema and lips, buccal mucosa, and tongue normal  NECK: supple, thyroid normal size, non-tender, without nodularity LYMPH: no palpable lymphadenopathy in the cervical, axillary or inguinal LUNGS: clear to auscultation and percussion with normal breathing effort HEART: regular rate & rhythm and no murmurs and no lower extremity edema ABDOMEN: abdomen soft, non-tender and normal bowel sounds MUSCULOSKELETAL: no cyanosis of digits and no clubbing  NEURO: alert & oriented x 3 with fluent speech, no focal motor/sensory deficits EXTREMITIES: No lower extremity edema  LABORATORY DATA:  I have reviewed the data as listed CMP Latest Ref Rng & Units 12/17/2018 12/04/2018 11/09/2018  Glucose 70 - 99 mg/dL 104(H) 132(H) 129(H)  BUN 6 - 20 mg/dL 16 13 16   Creatinine 0.44 - 1.00 mg/dL 0.82 0.91 0.94  Sodium 135 - 145 mmol/L 144 141 143  Potassium 3.5 - 5.1 mmol/L 3.8 3.7 4.8  Chloride 98 - 111 mmol/L 107 100 108  CO2 22 - 32 mmol/L 25 31 24   Calcium 8.9 - 10.3 mg/dL 9.4 10.6(H) 9.5  Total Protein 6.5 - 8.1 g/dL 6.7 7.1 -  Total Bilirubin 0.3 - 1.2 mg/dL 0.6 0.6 -  Alkaline Phos 38 - 126 U/L 68 - -  AST 15 - 41 U/L 22 22 -  ALT 0 - 44 U/L 10 12 -    Lab Results  Component Value Date   WBC 3.8 (L) 12/24/2018   HGB 11.3 (L) 12/24/2018   HCT 35.2 (L) 12/24/2018   MCV 83.8 12/24/2018   PLT 158 12/24/2018   NEUTROABS PENDING 12/24/2018    ASSESSMENT & PLAN:  Malignant neoplasm of upper-outer quadrant of left breast in female, estrogen receptor positive ([place]) 11/13/2018:Left mastectomy: Grade 2 IDC, 1.6 cm, with intermediate grade DCIS, margins negative, lymphovascular invasion present, 1/7 lymph nodes positive, MammaPrint high risk, ER 100%, PR 30%, HER-2 negative,  Ki-67 30% T1CN1A stage Ia  Treatment plan: 1.Adjuvant chemotherapy with dose since he remains on Cytoxan x4 followed by Taxol weekly x12 2.Followed by adjuvant radiation therapy 3.Followed by adjuvant antiestrogen therapy ------------------------------------------------------------------------------------------------------------------ Current Treatment: Today is cycle 1 day 8 DD AC Labs are reviewed  Chemo toxicities: 1.  Mild nausea 2.  Vaginal dryness Patient prefers early morning appointments. We also discussed the precautions to avoid contact with coronavirus Return to clinic in 1 week for cycle 2    No orders of the defined types were placed in this encounter.  The patient has a good understanding of the overall plan. she agrees with it. she will call with any problems that may develop before the next visit here.  Nicholas Lose, MD 12/24/2018  Julious Oka Dorshimer am acting as scribe for Dr. Nicholas Lose.  I have reviewed the above documentation for accuracy and completeness, and I agree with the above.

## 2018-12-19 NOTE — Patient Instructions (Signed)
Pegfilgrastim injection  What is this medicine?  PEGFILGRASTIM (PEG fil gra stim) is a long-acting granulocyte colony-stimulating factor that stimulates the growth of neutrophils, a type of white blood cell important in the body's fight against infection. It is used to reduce the incidence of fever and infection in patients with certain types of cancer who are receiving chemotherapy that affects the bone marrow, and to increase survival after being exposed to high doses of radiation.  This medicine may be used for other purposes; ask your health care provider or pharmacist if you have questions.  COMMON BRAND NAME(S): Fulphila, Neulasta, UDENYCA  What should I tell my health care provider before I take this medicine?  They need to know if you have any of these conditions:  -kidney disease  -latex allergy  -ongoing radiation therapy  -sickle cell disease  -skin reactions to acrylic adhesives (On-Body Injector only)  -an unusual or allergic reaction to pegfilgrastim, filgrastim, other medicines, foods, dyes, or preservatives  -pregnant or trying to get pregnant  -breast-feeding  How should I use this medicine?  This medicine is for injection under the skin. If you get this medicine at home, you will be taught how to prepare and give the pre-filled syringe or how to use the On-body Injector. Refer to the patient Instructions for Use for detailed instructions. Use exactly as directed. Tell your healthcare provider immediately if you suspect that the On-body Injector may not have performed as intended or if you suspect the use of the On-body Injector resulted in a missed or partial dose.  It is important that you put your used needles and syringes in a special sharps container. Do not put them in a trash can. If you do not have a sharps container, call your pharmacist or healthcare provider to get one.  Talk to your pediatrician regarding the use of this medicine in children. While this drug may be prescribed for  selected conditions, precautions do apply.  Overdosage: If you think you have taken too much of this medicine contact a poison control center or emergency room at once.  NOTE: This medicine is only for you. Do not share this medicine with others.  What if I miss a dose?  It is important not to miss your dose. Call your doctor or health care professional if you miss your dose. If you miss a dose due to an On-body Injector failure or leakage, a new dose should be administered as soon as possible using a single prefilled syringe for manual use.  What may interact with this medicine?  Interactions have not been studied.  Give your health care provider a list of all the medicines, herbs, non-prescription drugs, or dietary supplements you use. Also tell them if you smoke, drink alcohol, or use illegal drugs. Some items may interact with your medicine.  This list may not describe all possible interactions. Give your health care provider a list of all the medicines, herbs, non-prescription drugs, or dietary supplements you use. Also tell them if you smoke, drink alcohol, or use illegal drugs. Some items may interact with your medicine.  What should I watch for while using this medicine?  You may need blood work done while you are taking this medicine.  If you are going to need a MRI, CT scan, or other procedure, tell your doctor that you are using this medicine (On-Body Injector only).  What side effects may I notice from receiving this medicine?  Side effects that you should report to   your doctor or health care professional as soon as possible:  -allergic reactions like skin rash, itching or hives, swelling of the face, lips, or tongue  -back pain  -dizziness  -fever  -pain, redness, or irritation at site where injected  -pinpoint red spots on the skin  -red or dark-brown urine  -shortness of breath or breathing problems  -stomach or side pain, or pain at the shoulder  -swelling  -tiredness  -trouble passing urine or  change in the amount of urine  Side effects that usually do not require medical attention (report to your doctor or health care professional if they continue or are bothersome):  -bone pain  -muscle pain  This list may not describe all possible side effects. Call your doctor for medical advice about side effects. You may report side effects to FDA at 1-800-FDA-1088.  Where should I keep my medicine?  Keep out of the reach of children.  If you are using this medicine at home, you will be instructed on how to store it. Throw away any unused medicine after the expiration date on the label.  NOTE: This sheet is a summary. It may not cover all possible information. If you have questions about this medicine, talk to your doctor, pharmacist, or health care provider.   2019 Elsevier/Gold Standard (2017-12-25 16:57:08)

## 2018-12-24 ENCOUNTER — Inpatient Hospital Stay: Payer: BC Managed Care – PPO

## 2018-12-24 ENCOUNTER — Other Ambulatory Visit: Payer: Self-pay

## 2018-12-24 ENCOUNTER — Inpatient Hospital Stay (HOSPITAL_BASED_OUTPATIENT_CLINIC_OR_DEPARTMENT_OTHER): Payer: BC Managed Care – PPO | Admitting: Hematology and Oncology

## 2018-12-24 DIAGNOSIS — Z79899 Other long term (current) drug therapy: Secondary | ICD-10-CM | POA: Diagnosis not present

## 2018-12-24 DIAGNOSIS — Z170 Estrogen receptor positive status [ER+]: Principal | ICD-10-CM

## 2018-12-24 DIAGNOSIS — Z5111 Encounter for antineoplastic chemotherapy: Secondary | ICD-10-CM | POA: Diagnosis not present

## 2018-12-24 DIAGNOSIS — C50412 Malignant neoplasm of upper-outer quadrant of left female breast: Secondary | ICD-10-CM

## 2018-12-24 DIAGNOSIS — R110 Nausea: Secondary | ICD-10-CM | POA: Diagnosis not present

## 2018-12-24 LAB — CMP (CANCER CENTER ONLY)
ALT: 15 U/L (ref 0–44)
AST: 15 U/L (ref 15–41)
Albumin: 3.7 g/dL (ref 3.5–5.0)
Alkaline Phosphatase: 78 U/L (ref 38–126)
Anion gap: 11 (ref 5–15)
BUN: 11 mg/dL (ref 6–20)
CHLORIDE: 101 mmol/L (ref 98–111)
CO2: 28 mmol/L (ref 22–32)
Calcium: 9.4 mg/dL (ref 8.9–10.3)
Creatinine: 0.81 mg/dL (ref 0.44–1.00)
GFR, Est AFR Am: >60 mL/min (ref >60)
GFR, Estimated: >60 mL/min (ref >60)
Glucose, Bld: 133 mg/dL — ABNORMAL HIGH (ref 70–99)
Potassium: 4.5 mmol/L (ref 3.5–5.1)
Sodium: 140 mmol/L (ref 135–145)
Total Bilirubin: 0.5 mg/dL (ref 0.3–1.2)
Total Protein: 6.3 g/dL — ABNORMAL LOW (ref 6.5–8.1)

## 2018-12-24 LAB — CBC WITH DIFFERENTIAL (CANCER CENTER ONLY)
ABS IMMATURE GRANULOCYTES: 0 10*3/uL (ref 0.00–0.07)
BASOS ABS: 0.2 10*3/uL — AB (ref 0.0–0.1)
Band Neutrophils: 13 %
Basophils Relative: 4 %
EOS PCT: 1 %
Eosinophils Absolute: 0 10*3/uL (ref 0.0–0.5)
HCT: 35.2 % — ABNORMAL LOW (ref 36.0–46.0)
Hemoglobin: 11.3 g/dL — ABNORMAL LOW (ref 12.0–15.0)
LYMPHS ABS: 1.1 10*3/uL (ref 0.7–4.0)
LYMPHS PCT: 30 %
MCH: 26.9 pg (ref 26.0–34.0)
MCHC: 32.1 g/dL (ref 30.0–36.0)
MCV: 83.8 fL (ref 80.0–100.0)
Monocytes Absolute: 0.2 10*3/uL (ref 0.1–1.0)
Monocytes Relative: 5 %
Neutro Abs: 2.3 10*3/uL (ref 1.7–17.7)
Neutrophils Relative %: 47 %
Platelet Count: 158 10*3/uL (ref 150–400)
RBC: 4.2 MIL/uL (ref 3.87–5.11)
RDW: 13.9 % (ref 11.5–15.5)
WBC Count: 3.8 10*3/uL — ABNORMAL LOW (ref 4.0–10.5)
nRBC: 0 % (ref 0.0–0.2)

## 2018-12-24 NOTE — Assessment & Plan Note (Signed)
11/13/2018:Left mastectomy: Grade 2 IDC, 1.6 cm, with intermediate grade DCIS, margins negative, lymphovascular invasion present, 1/7 lymph nodes positive, MammaPrint high risk, ER 100%, PR 30%, HER-2 negative, Ki-67 30% T1CN1A stage Ia  Treatment plan: 1.Adjuvant chemotherapy with dose since he remains on Cytoxan x4 followed by Taxol weekly x12 2.Followed by adjuvant radiation therapy 3.Followed by adjuvant antiestrogen therapy ------------------------------------------------------------------------------------------------------------------ Current Treatment: Today is cycle 1 day 8 DD AC Labs are reviewed  Chemo toxicities:  Return to clinic in 1 week for cycle 2

## 2018-12-30 NOTE — Assessment & Plan Note (Signed)
11/13/2018:Left mastectomy: Grade 2 IDC, 1.6 cm, with intermediate grade DCIS, margins negative, lymphovascular invasion present, 1/7 lymph nodes positive, MammaPrint high risk, ER 100%, PR 30%, HER-2 negative, Ki-67 30% T1CN1A stage Ia  Treatment plan: 1.Adjuvant chemotherapy with dose since he remains on Cytoxan x4 followed by Taxol weekly x12 2.Followed by adjuvant radiation therapy 3.Followed by adjuvant antiestrogen therapy ------------------------------------------------------------------------------------------------------------------ Current Treatment: Today is cycle 2 day 1 DD AC Labs are reviewed  Chemo toxicities: 1.  Mild nausea 2.  Vaginal dryness  We also discussed the precautions to avoid contact with coronavirus  Return to clinic in 2 weeks for cycle 3

## 2018-12-31 ENCOUNTER — Inpatient Hospital Stay (HOSPITAL_BASED_OUTPATIENT_CLINIC_OR_DEPARTMENT_OTHER): Payer: BC Managed Care – PPO | Admitting: Hematology and Oncology

## 2018-12-31 ENCOUNTER — Other Ambulatory Visit: Payer: Self-pay

## 2018-12-31 ENCOUNTER — Inpatient Hospital Stay: Payer: BC Managed Care – PPO

## 2018-12-31 DIAGNOSIS — C50412 Malignant neoplasm of upper-outer quadrant of left female breast: Secondary | ICD-10-CM | POA: Diagnosis not present

## 2018-12-31 DIAGNOSIS — Z170 Estrogen receptor positive status [ER+]: Principal | ICD-10-CM

## 2018-12-31 DIAGNOSIS — R110 Nausea: Secondary | ICD-10-CM

## 2018-12-31 DIAGNOSIS — Z9012 Acquired absence of left breast and nipple: Secondary | ICD-10-CM

## 2018-12-31 DIAGNOSIS — D6481 Anemia due to antineoplastic chemotherapy: Secondary | ICD-10-CM

## 2018-12-31 DIAGNOSIS — Z5111 Encounter for antineoplastic chemotherapy: Secondary | ICD-10-CM | POA: Diagnosis not present

## 2018-12-31 DIAGNOSIS — Z95828 Presence of other vascular implants and grafts: Secondary | ICD-10-CM

## 2018-12-31 LAB — CMP (CANCER CENTER ONLY)
ALT: 19 U/L (ref 0–44)
AST: 24 U/L (ref 15–41)
Albumin: 3.5 g/dL (ref 3.5–5.0)
Alkaline Phosphatase: 104 U/L (ref 38–126)
Anion gap: 10 (ref 5–15)
BUN: 11 mg/dL (ref 6–20)
CO2: 27 mmol/L (ref 22–32)
Calcium: 9.1 mg/dL (ref 8.9–10.3)
Chloride: 105 mmol/L (ref 98–111)
Creatinine: 0.77 mg/dL (ref 0.44–1.00)
GFR, Est AFR Am: >60 mL/min (ref >60)
GFR, Estimated: >60 mL/min (ref >60)
GLUCOSE: 102 mg/dL — AB (ref 70–99)
Potassium: 3.8 mmol/L (ref 3.5–5.1)
Sodium: 142 mmol/L (ref 135–145)
TOTAL PROTEIN: 6.3 g/dL — AB (ref 6.5–8.1)
Total Bilirubin: <0.2 mg/dL — ABNORMAL LOW (ref 0.3–1.2)

## 2018-12-31 LAB — CBC WITH DIFFERENTIAL (CANCER CENTER ONLY)
Abs Immature Granulocytes: 2.32 10*3/uL — ABNORMAL HIGH (ref 0.00–0.07)
BASOS PCT: 1 %
Basophils Absolute: 0.1 10*3/uL (ref 0.0–0.1)
Eosinophils Absolute: 0 10*3/uL (ref 0.0–0.5)
Eosinophils Relative: 0 %
HCT: 33.3 % — ABNORMAL LOW (ref 36.0–46.0)
HEMOGLOBIN: 10.5 g/dL — AB (ref 12.0–15.0)
Immature Granulocytes: 11 %
Lymphocytes Relative: 7 %
Lymphs Abs: 1.3 10*3/uL (ref 0.7–4.0)
MCH: 26.7 pg (ref 26.0–34.0)
MCHC: 31.5 g/dL (ref 30.0–36.0)
MCV: 84.7 fL (ref 80.0–100.0)
MONOS PCT: 6 %
Monocytes Absolute: 1.2 10*3/uL — ABNORMAL HIGH (ref 0.1–1.0)
Neutro Abs: 15.4 10*3/uL — ABNORMAL HIGH (ref 1.7–7.7)
Neutrophils Relative %: 75 %
Platelet Count: 202 10*3/uL (ref 150–400)
RBC: 3.93 MIL/uL (ref 3.87–5.11)
RDW: 14.8 % (ref 11.5–15.5)
WBC Count: 20.4 10*3/uL — ABNORMAL HIGH (ref 4.0–10.5)
nRBC: 0 % (ref 0.0–0.2)

## 2018-12-31 MED ORDER — SODIUM CHLORIDE 0.9% FLUSH
10.0000 mL | INTRAVENOUS | Status: DC | PRN
  Administered 2018-12-31: 10 mL
  Filled 2018-12-31: qty 10

## 2018-12-31 MED ORDER — PALONOSETRON HCL INJECTION 0.25 MG/5ML
INTRAVENOUS | Status: AC
  Filled 2018-12-31: qty 5

## 2018-12-31 MED ORDER — HEPARIN SOD (PORK) LOCK FLUSH 100 UNIT/ML IV SOLN
500.0000 [IU] | Freq: Once | INTRAVENOUS | Status: AC | PRN
  Administered 2018-12-31: 500 [IU]
  Filled 2018-12-31: qty 5

## 2018-12-31 MED ORDER — PALONOSETRON HCL INJECTION 0.25 MG/5ML
0.2500 mg | Freq: Once | INTRAVENOUS | Status: AC
  Administered 2018-12-31: 0.25 mg via INTRAVENOUS

## 2018-12-31 MED ORDER — DOXORUBICIN HCL CHEMO IV INJECTION 2 MG/ML
60.0000 mg/m2 | Freq: Once | INTRAVENOUS | Status: AC
  Administered 2018-12-31: 92 mg via INTRAVENOUS
  Filled 2018-12-31: qty 46

## 2018-12-31 MED ORDER — SODIUM CHLORIDE 0.9 % IV SOLN
Freq: Once | INTRAVENOUS | Status: AC
  Administered 2018-12-31: 10:00:00 via INTRAVENOUS
  Filled 2018-12-31: qty 250

## 2018-12-31 MED ORDER — SODIUM CHLORIDE 0.9 % IV SOLN
600.0000 mg/m2 | Freq: Once | INTRAVENOUS | Status: AC
  Administered 2018-12-31: 920 mg via INTRAVENOUS
  Filled 2018-12-31: qty 46

## 2018-12-31 MED ORDER — SODIUM CHLORIDE 0.9 % IV SOLN
Freq: Once | INTRAVENOUS | Status: AC
  Administered 2018-12-31: 10:00:00 via INTRAVENOUS
  Filled 2018-12-31: qty 5

## 2018-12-31 MED ORDER — LORAZEPAM 0.5 MG PO TABS: 0.5000 mg | ORAL_TABLET | Freq: Every evening | ORAL | 0 refills | Status: DC | PRN

## 2018-12-31 MED ORDER — SODIUM CHLORIDE 0.9% FLUSH
10.0000 mL | INTRAVENOUS | Status: DC | PRN
  Administered 2018-12-31: 10 mL via INTRAVENOUS
  Filled 2018-12-31: qty 10

## 2018-12-31 NOTE — Progress Notes (Signed)
HEMATOLOGY-ONCOLOGY [place] VISIT PROGRESS NOTE  I connected with Heather [name] on 12/31/2018 at  9:00 AM EDT by Webex video conference and verified that I am speaking with the correct person using two identifiers.  I discussed the limitations, risks, security and privacy concerns of performing an evaluation and management service by Webex and the availability of in person appointments.  I also discussed with the patient that there may be a patient responsible charge related to this service. The patient expressed understanding and agreed to proceed.   History of Present Illness: Cycle 2 Dose dense Adriamycin and Cytoxan Patient is here to receive her second cycle of dose dense AC today. She tolerated cycle 1 extremely well. Denied any nausea or vomiting    Malignant neoplasm of upper-outer quadrant of left breast in female, estrogen receptor positive ([place])   09/12/2018 Initial Diagnosis    Palpable left breast mass at 2:30 position by ultrasound measured 1.6 cm, calcifications spanning 5.9 cm, 3 abnormal lymph nodes, multiple masses and calcifications biopsy of the mass revealed grade 2 IDC plus DCIS with lymphovascular invasion, ER 100%, PR 60%, Ki-67 30%, HER-2 negative IHC 1+, pathology of calcifications is pending, biopsy of lymph nodes is discordant benign, T1cN0 stage Ia    09/19/2018 Cancer Staging    Staging form: Breast, AJCC 8th Edition - Clinical stage from 09/19/2018: Stage IA (cT1c(m), cN0, cM0, G2, ER+, PR+, HER2-) - Signed by Nicholas Lose, MD on 09/19/2018    11/13/2018 Surgery    Left mastectomy: Grade 2 IDC, 1.6 cm, with intermediate grade DCIS, margins negative, lymphovascular invasion present, 1/7 lymph nodes positive, MammaPrint high risk, ER 100%, PR 30%, HER-2 negative, Ki-67 30% T1CN1A stage Ia    11/13/2018 Cancer Staging    Staging form: Breast, AJCC 8th Edition - Pathologic stage from 11/13/2018: Stage IA (pT1c, pN1a, cM0, G2, ER+, PR+, HER2-) - Signed by  Gardenia Phlegm, NP on 11/28/2018    12/17/2018 -  Chemotherapy    The patient had DOXOrubicin (ADRIAMYCIN) chemo injection 92 mg, 60 mg/m2 = 92 mg, Intravenous,  Once, 1 of 4 cycles Administration: 92 mg (12/17/2018) palonosetron (ALOXI) injection 0.25 mg, 0.25 mg, Intravenous,  Once, 1 of 4 cycles Administration: 0.25 mg (12/17/2018) pegfilgrastim-cbqv (UDENYCA) injection 6 mg, 6 mg, Subcutaneous, Once, 1 of 4 cycles Administration: 6 mg (12/19/2018) cyclophosphamide (CYTOXAN) 920 mg in sodium chloride 0.9 % 250 mL chemo infusion, 600 mg/m2 = 920 mg, Intravenous,  Once, 1 of 4 cycles Administration: 920 mg (12/17/2018) PACLitaxel (TAXOL) 120 mg in sodium chloride 0.9 % 250 mL chemo infusion (</= 76m/m2), 80 mg/m2 = 120 mg, Intravenous,  Once, 0 of 12 cycles fosaprepitant (EMEND) 150 mg, dexamethasone (DECADRON) 12 mg in sodium chloride 0.9 % 145 mL IVPB, , Intravenous,  Once, 1 of 4 cycles Administration:  (12/17/2018)  for chemotherapy treatment.      Observations/Objective:  CBC Latest Ref Rng & Units 12/24/2018 12/17/2018 12/04/2018  WBC 4.0 - 10.5 K/uL 3.8(L) 11.7(H) 6.9  Hemoglobin 12.0 - 15.0 g/dL 11.3(L) 11.8(L) 12.3  Hematocrit 36.0 - 46.0 % 35.2(L) 37.4 38.4  Platelets 150 - 400 K/uL 158 368 398      Assessment Plan:  Malignant neoplasm of upper-outer quadrant of left breast in female, estrogen receptor positive ([facility]) 11/13/2018:Left mastectomy: Grade 2 IDC, 1.6 cm, with intermediate grade DCIS, margins negative, lymphovascular invasion present, 1/7 lymph nodes positive, MammaPrint high risk, ER 100%, PR 30%, HER-2 negative, Ki-67 30% T1CN1A stage Ia  Treatment plan: 1.Adjuvant  chemotherapy with dose since he remains on Cytoxan x4 followed by Taxol weekly x12 2.Followed by adjuvant radiation therapy 3.Followed by adjuvant antiestrogen therapy ------------------------------------------------------------------------------------------------------------------ Current  Treatment: Today is cycle 2 day 1 DD AC Labs are reviewed  Chemo toxicities: 1.  Mild nausea 2.  Vaginal dryness 3. Chemo induced anemia: Monitoring closely. No change in treatment yet.  We also discussed the precautions to avoid contact with coronavirus  Return to clinic in 2 weeks for cycle 3    I discussed the assessment and treatment plan with the patient. The patient was provided an opportunity to ask questions and all were answered. The patient agreed with the plan and demonstrated an understanding of the instructions. The patient was advised to call back or seek an in-person evaluation if the symptoms worsen or if the condition fails to improve as anticipated.   I provided 20 minutes of non-face-to-face time during this encounter.   Harriette Ohara, MD  12/31/2018

## 2018-12-31 NOTE — Patient Instructions (Signed)
[facility] Discharge Instructions for Patients Receiving Chemotherapy  Today you received the following chemotherapy agents: Adriamycin, Cytoxan  To help prevent nausea and vomiting after your treatment, we encourage you to take your nausea medication as directed.   If you develop nausea and vomiting that is not controlled by your nausea medication, call the clinic.   BELOW ARE SYMPTOMS THAT SHOULD BE REPORTED IMMEDIATELY:  *FEVER GREATER THAN 100.5 F  *CHILLS WITH OR WITHOUT FEVER  NAUSEA AND VOMITING THAT IS NOT CONTROLLED WITH YOUR NAUSEA MEDICATION  *UNUSUAL SHORTNESS OF BREATH  *UNUSUAL BRUISING OR BLEEDING  TENDERNESS IN MOUTH AND THROAT WITH OR WITHOUT PRESENCE OF ULCERS  *URINARY PROBLEMS  *BOWEL PROBLEMS  UNUSUAL RASH Items with * indicate a potential emergency and should be followed up as soon as possible.  Feel free to call the clinic should you have any questions or concerns. The clinic phone number is [phone].  Please show the CHEMO ALERT CARD at check-in to the Emergency Department and triage nurse.   

## 2019-01-02 ENCOUNTER — Inpatient Hospital Stay: Payer: BC Managed Care – PPO | Attending: Genetic Counselor

## 2019-01-02 ENCOUNTER — Other Ambulatory Visit: Payer: Self-pay

## 2019-01-02 DIAGNOSIS — Z79899 Other long term (current) drug therapy: Secondary | ICD-10-CM | POA: Diagnosis not present

## 2019-01-02 DIAGNOSIS — Z5111 Encounter for antineoplastic chemotherapy: Secondary | ICD-10-CM | POA: Insufficient documentation

## 2019-01-02 DIAGNOSIS — D6481 Anemia due to antineoplastic chemotherapy: Secondary | ICD-10-CM | POA: Diagnosis not present

## 2019-01-02 DIAGNOSIS — C50412 Malignant neoplasm of upper-outer quadrant of left female breast: Secondary | ICD-10-CM | POA: Diagnosis present

## 2019-01-02 DIAGNOSIS — R110 Nausea: Secondary | ICD-10-CM | POA: Diagnosis not present

## 2019-01-02 DIAGNOSIS — Z5189 Encounter for other specified aftercare: Secondary | ICD-10-CM | POA: Diagnosis not present

## 2019-01-02 DIAGNOSIS — R5383 Other fatigue: Secondary | ICD-10-CM | POA: Insufficient documentation

## 2019-01-02 DIAGNOSIS — Z170 Estrogen receptor positive status [ER+]: Secondary | ICD-10-CM | POA: Insufficient documentation

## 2019-01-02 MED ORDER — PEGFILGRASTIM-CBQV 6 MG/0.6ML ~~LOC~~ SOSY
PREFILLED_SYRINGE | SUBCUTANEOUS | Status: AC
  Filled 2019-01-02: qty 0.6

## 2019-01-02 MED ORDER — PEGFILGRASTIM-CBQV 6 MG/0.6ML ~~LOC~~ SOSY
6.0000 mg | PREFILLED_SYRINGE | Freq: Once | SUBCUTANEOUS | Status: AC
  Administered 2019-01-02: 13:00:00 6 mg via SUBCUTANEOUS

## 2019-01-02 NOTE — Patient Instructions (Signed)
Pegfilgrastim injection  What is this medicine?  PEGFILGRASTIM (PEG fil gra stim) is a long-acting granulocyte colony-stimulating factor that stimulates the growth of neutrophils, a type of white blood cell important in the body's fight against infection. It is used to reduce the incidence of fever and infection in patients with certain types of cancer who are receiving chemotherapy that affects the bone marrow, and to increase survival after being exposed to high doses of radiation.  This medicine may be used for other purposes; ask your health care provider or pharmacist if you have questions.  COMMON BRAND NAME(S): Fulphila, Neulasta, UDENYCA  What should I tell my health care provider before I take this medicine?  They need to know if you have any of these conditions:  -kidney disease  -latex allergy  -ongoing radiation therapy  -sickle cell disease  -skin reactions to acrylic adhesives (On-Body Injector only)  -an unusual or allergic reaction to pegfilgrastim, filgrastim, other medicines, foods, dyes, or preservatives  -pregnant or trying to get pregnant  -breast-feeding  How should I use this medicine?  This medicine is for injection under the skin. If you get this medicine at home, you will be taught how to prepare and give the pre-filled syringe or how to use the On-body Injector. Refer to the patient Instructions for Use for detailed instructions. Use exactly as directed. Tell your healthcare provider immediately if you suspect that the On-body Injector may not have performed as intended or if you suspect the use of the On-body Injector resulted in a missed or partial dose.  It is important that you put your used needles and syringes in a special sharps container. Do not put them in a trash can. If you do not have a sharps container, call your pharmacist or healthcare provider to get one.  Talk to your pediatrician regarding the use of this medicine in children. While this drug may be prescribed for  selected conditions, precautions do apply.  Overdosage: If you think you have taken too much of this medicine contact a poison control center or emergency room at once.  NOTE: This medicine is only for you. Do not share this medicine with others.  What if I miss a dose?  It is important not to miss your dose. Call your doctor or health care professional if you miss your dose. If you miss a dose due to an On-body Injector failure or leakage, a new dose should be administered as soon as possible using a single prefilled syringe for manual use.  What may interact with this medicine?  Interactions have not been studied.  Give your health care provider a list of all the medicines, herbs, non-prescription drugs, or dietary supplements you use. Also tell them if you smoke, drink alcohol, or use illegal drugs. Some items may interact with your medicine.  This list may not describe all possible interactions. Give your health care provider a list of all the medicines, herbs, non-prescription drugs, or dietary supplements you use. Also tell them if you smoke, drink alcohol, or use illegal drugs. Some items may interact with your medicine.  What should I watch for while using this medicine?  You may need blood work done while you are taking this medicine.  If you are going to need a MRI, CT scan, or other procedure, tell your doctor that you are using this medicine (On-Body Injector only).  What side effects may I notice from receiving this medicine?  Side effects that you should report to   your doctor or health care professional as soon as possible:  -allergic reactions like skin rash, itching or hives, swelling of the face, lips, or tongue  -back pain  -dizziness  -fever  -pain, redness, or irritation at site where injected  -pinpoint red spots on the skin  -red or dark-brown urine  -shortness of breath or breathing problems  -stomach or side pain, or pain at the shoulder  -swelling  -tiredness  -trouble passing urine or  change in the amount of urine  Side effects that usually do not require medical attention (report to your doctor or health care professional if they continue or are bothersome):  -bone pain  -muscle pain  This list may not describe all possible side effects. Call your doctor for medical advice about side effects. You may report side effects to FDA at 1-800-FDA-1088.  Where should I keep my medicine?  Keep out of the reach of children.  If you are using this medicine at home, you will be instructed on how to store it. Throw away any unused medicine after the expiration date on the label.  NOTE: This sheet is a summary. It may not cover all possible information. If you have questions about this medicine, talk to your doctor, pharmacist, or health care provider.   2019 Elsevier/Gold Standard (2017-12-25 16:57:08)

## 2019-01-08 NOTE — Assessment & Plan Note (Signed)
11/13/2018:Left mastectomy: Grade 2 IDC, 1.6 cm, with intermediate grade DCIS, margins negative, lymphovascular invasion present, 1/7 lymph nodes positive, MammaPrint high risk, ER 100%, PR 30%, HER-2 negative, Ki-67 30% T1CN1A stage Ia  Treatment plan: 1.Adjuvant chemotherapy with dose since he remains on Cytoxan x4 followed by Taxol weekly x12 2.Followed by adjuvant radiation therapy 3.Followed by adjuvant antiestrogen therapy ------------------------------------------------------------------------------------------------------------------ Current Treatment: Today is cycle 3 day1DD AC Labs are reviewed  Chemo toxicities: 1.Mild nausea 2.Vaginal dryness 3. Chemo induced anemia: Monitoring closely. No change in treatment yet.  We also discussed the precautions to avoid contact with coronavirus  Return to clinicin2 weeks for cycle 4

## 2019-01-09 NOTE — Progress Notes (Signed)
Patient Care Team: Emeterio Reeve, DO as PCP - General (Osteopathic Medicine) Rolm Bookbinder, MD as Consulting Physician (General Surgery) Nicholas Lose, MD as Consulting Physician (Hematology and Oncology) Eppie Gibson, MD as Attending Physician (Radiation Oncology)  Patient Location: Clinic Physician Location: WebEx/ Home  DIAGNOSIS:    ICD-10-CM   1. Malignant neoplasm of upper-outer quadrant of left breast in female, estrogen receptor positive ([place]) C50.412    Z17.0     SUMMARY OF ONCOLOGIC HISTORY:   Malignant neoplasm of upper-outer quadrant of left breast in female, estrogen receptor positive ([place])   09/12/2018 Initial Diagnosis    Palpable left breast mass at 2:30 position by ultrasound measured 1.6 cm, calcifications spanning 5.9 cm, 3 abnormal lymph nodes, multiple masses and calcifications biopsy of the mass revealed grade 2 IDC plus DCIS with lymphovascular invasion, ER 100%, PR 60%, Ki-67 30%, HER-2 negative IHC 1+, pathology of calcifications is pending, biopsy of lymph nodes is discordant benign, T1cN0 stage Ia    09/19/2018 Cancer Staging    Staging form: Breast, AJCC 8th Edition - Clinical stage from 09/19/2018: Stage IA (cT1c(m), cN0, cM0, G2, ER+, PR+, HER2-) - Signed by Nicholas Lose, MD on 09/19/2018    11/13/2018 Surgery    Left mastectomy: Grade 2 IDC, 1.6 cm, with intermediate grade DCIS, margins negative, lymphovascular invasion present, 1/7 lymph nodes positive, MammaPrint high risk, ER 100%, PR 30%, HER-2 negative, Ki-67 30% T1CN1A stage Ia    11/13/2018 Cancer Staging    Staging form: Breast, AJCC 8th Edition - Pathologic stage from 11/13/2018: Stage IA (pT1c, pN1a, cM0, G2, ER+, PR+, HER2-) - Signed by Gardenia Phlegm, NP on 11/28/2018    12/17/2018 -  Chemotherapy    The patient had DOXOrubicin (ADRIAMYCIN) chemo injection 92 mg, 60 mg/m2 = 92 mg, Intravenous,  Once, 2 of 4 cycles Administration: 92 mg (12/17/2018), 92 mg (12/31/2018)  palonosetron (ALOXI) injection 0.25 mg, 0.25 mg, Intravenous,  Once, 2 of 4 cycles Administration: 0.25 mg (12/17/2018), 0.25 mg (12/31/2018) pegfilgrastim-cbqv (UDENYCA) injection 6 mg, 6 mg, Subcutaneous, Once, 2 of 4 cycles Administration: 6 mg (12/19/2018) cyclophosphamide (CYTOXAN) 920 mg in sodium chloride 0.9 % 250 mL chemo infusion, 600 mg/m2 = 920 mg, Intravenous,  Once, 2 of 4 cycles Administration: 920 mg (12/17/2018), 920 mg (12/31/2018) PACLitaxel (TAXOL) 120 mg in sodium chloride 0.9 % 250 mL chemo infusion (</= '80mg'$ /m2), 80 mg/m2 = 120 mg, Intravenous,  Once, 0 of 12 cycles fosaprepitant (EMEND) 150 mg, dexamethasone (DECADRON) 12 mg in sodium chloride 0.9 % 145 mL IVPB, , Intravenous,  Once, 2 of 4 cycles Administration:  (12/17/2018),  (12/31/2018)  for chemotherapy treatment.      CHIEF COMPLIANT: Cycle 3 Adriamycin and Cytoxan  INTERVAL HISTORY: Heather [name] is a 58 y.o. with above-mentioned history of left breast cancer treated with mastectomy who is currently receiving adjuvant chemotherapy with dose dense Adriamycin and Cytoxan. She presents to the clinic alone today for cycle 3.  She is tolerating chemotherapy extremely well.  Does not have any nausea or vomiting.  REVIEW OF SYSTEMS:   Constitutional: Denies fevers, chills or abnormal weight loss Eyes: Denies blurriness of vision Ears, nose, mouth, throat, and face: Denies mucositis or sore throat Respiratory: Denies cough, dyspnea or wheezes Cardiovascular: Denies palpitation, chest discomfort Gastrointestinal: Denies nausea, heartburn or change in bowel habits Skin: Denies abnormal skin rashes Lymphatics: Denies new lymphadenopathy or easy bruising Neurological: Denies numbness, tingling or new weaknesses Behavioral/Psych: Mood is stable, no new changes  Extremities: No lower extremity edema Breast: denies any pain or lumps or nodules in either breasts All other systems were reviewed with the patient and are  negative.  I have reviewed the past medical history, past surgical history, social history and family history with the patient and they are unchanged from previous note.  ALLERGIES:  has No Known Allergies.  MEDICATIONS:  Current Outpatient Medications  Medication Sig Dispense Refill  . dexamethasone (DECADRON) 4 MG tablet Take 1 tablet day after chemo and 1 tablet 2 days after chemo with food 8 tablet 0  . lidocaine-prilocaine (EMLA) cream Apply 1 application topically as needed. 30 g 0  . lisinopril-hydrochlorothiazide (PRINZIDE,ZESTORETIC) 10-12.5 MG tablet TAKE 1 TABLET BY MOUTH EVERY DAY 90 tablet 0  . LORazepam (ATIVAN) 0.5 MG tablet Take 1 tablet (0.5 mg total) by mouth at bedtime as needed for sleep. 30 tablet 0  . ondansetron (ZOFRAN) 8 MG tablet Take 1 tablet (8 mg total) by mouth 2 (two) times daily as needed. Start on the third day after chemotherapy. (Patient not taking: Reported on 12/04/2018) 30 tablet 1  . prochlorperazine (COMPAZINE) 10 MG tablet Take 1 tablet (10 mg total) by mouth every 6 (six) hours as needed (Nausea or vomiting). (Patient not taking: Reported on 12/04/2018) 30 tablet 1  . Vitamin D, Ergocalciferol, (DRISDOL) 1.25 MG (50000 UT) CAPS capsule Take 1 capsule (50,000 Units total) by mouth every 7 (seven) days. Take for 12 total doses(weeks) than can transition to 1000 units OTC supplement daily 12 capsule 0   No current facility-administered medications for this visit.    Facility-Administered Medications Ordered in Other Visits  Medication Dose Route Frequency Provider Last Rate Last Dose  . sodium chloride flush (NS) 0.9 % injection 10 mL  10 mL Intravenous PRN Nicholas Lose, MD   10 mL at 12/17/18 0907    PHYSICAL EXAMINATION: ECOG PERFORMANCE STATUS: 1 - Symptomatic but completely ambulatory  Vitals:   01/14/19 0916  BP: 123/80  Pulse: 83  Resp: 20  Temp: 98.2 F (36.8 C)  SpO2: 100%   Filed Weights   01/14/19 0916  Weight: 109 lb 3.2 oz (49.5  kg)    Physical Exam: Not done because this is a WebEx Visit  LABORATORY DATA:  I have reviewed the data as listed CMP Latest Ref Rng & Units 12/31/2018 12/24/2018 12/17/2018  Glucose 70 - 99 mg/dL 102(H) 133(H) 104(H)  BUN 6 - 20 mg/dL _0 Creatinine 0.44 - 1.00 mg/dL 0.77 0.81 0.82  Sodium 135 - 145 mmol/L 142 140 144  Potassium 3.5 - 5.1 mmol/L 3.8 4.5 3.8  Chloride 98 - 111 mmol/L 105 101 107  CO2 22 - 32 mmol/L _1 Calcium 8.9 - 10.3 mg/dL 9.1 9.4 9.4  Total Protein 6.5 - 8.1 g/dL 6.3(L) 6.3(L) 6.7  Total Bilirubin 0.3 - 1.2 mg/dL <0.2(L) 0.5 0.6  Alkaline Phos 38 - 126 U/L 104 78 68  AST 15 - 41 U/L _2 ALT 0 - 44 U/L _3 Lab Results  Component Value Date   WBC 21.5 (H) 01/14/2019   HGB 10.5 (L) 01/14/2019   HCT 33.3 (L) 01/14/2019   MCV 84.9 01/14/2019   PLT 229 01/14/2019   NEUTROABS PENDING 01/14/2019    ASSESSMENT & PLAN:  Malignant neoplasm of upper-outer quadrant of left breast in female, estrogen receptor positive ([place]) 11/13/2018:Left mastectomy: Grade 2 IDC, 1.6 cm, with intermediate grade DCIS, margins  negative, lymphovascular invasion present, 1/7 lymph nodes positive, MammaPrint high risk, ER 100%, PR 30%, HER-2 negative, Ki-67 30% T1CN1A stage Ia  Treatment plan: 1.Adjuvant chemotherapy with dose since he remains on Cytoxan x4 followed by Taxol weekly x12 2.Followed by adjuvant radiation therapy 3.Followed by adjuvant antiestrogen therapy ------------------------------------------------------------------------------------------------------------------ Current Treatment: Today is cycle 3 day1DD AC Labs are reviewed  Chemo toxicities: 1.Mild nausea lasts 2-3 days after chemo with loss of taste 2.Vaginal dryness 3. Chemo induced anemia: Monitoring closely. No change in treatment yet. Discussed with her about slightly decreased protein levels and encouraged her to eat more protein rich diet/ take protein supplements   Return to clinicin2 weeks for cycle 4  No orders of the defined types were placed in this encounter.  The patient has a good understanding of the overall plan. she agrees with it. she will call with any problems that may develop before the next visit here.  Nicholas Lose, MD 01/14/2019  Julious Oka Dorshimer am acting as scribe for Dr. Nicholas Lose.  I have reviewed the above documentation for accuracy and completeness, and I agree with the above.

## 2019-01-14 ENCOUNTER — Inpatient Hospital Stay: Payer: BC Managed Care – PPO

## 2019-01-14 ENCOUNTER — Other Ambulatory Visit: Payer: Self-pay

## 2019-01-14 ENCOUNTER — Encounter: Payer: Self-pay | Admitting: *Deleted

## 2019-01-14 ENCOUNTER — Inpatient Hospital Stay: Payer: BC Managed Care – PPO | Admitting: Hematology and Oncology

## 2019-01-14 DIAGNOSIS — Z170 Estrogen receptor positive status [ER+]: Secondary | ICD-10-CM

## 2019-01-14 DIAGNOSIS — Z95828 Presence of other vascular implants and grafts: Secondary | ICD-10-CM | POA: Insufficient documentation

## 2019-01-14 DIAGNOSIS — Z79899 Other long term (current) drug therapy: Secondary | ICD-10-CM | POA: Diagnosis not present

## 2019-01-14 DIAGNOSIS — C50412 Malignant neoplasm of upper-outer quadrant of left female breast: Secondary | ICD-10-CM | POA: Diagnosis not present

## 2019-01-14 DIAGNOSIS — Z5189 Encounter for other specified aftercare: Secondary | ICD-10-CM | POA: Diagnosis not present

## 2019-01-14 HISTORY — DX: Presence of other vascular implants and grafts: Z95.828

## 2019-01-14 LAB — CMP (CANCER CENTER ONLY)
ALT: 20 U/L (ref 0–44)
AST: 29 U/L (ref 15–41)
Albumin: 3.8 g/dL (ref 3.5–5.0)
Alkaline Phosphatase: 116 U/L (ref 38–126)
Anion gap: 11 (ref 5–15)
BUN: 12 mg/dL (ref 6–20)
CO2: 27 mmol/L (ref 22–32)
Calcium: 9.5 mg/dL (ref 8.9–10.3)
Chloride: 103 mmol/L (ref 98–111)
Creatinine: 0.81 mg/dL (ref 0.44–1.00)
GFR, Est AFR Am: >60 mL/min (ref >60)
GFR, Estimated: >60 mL/min (ref >60)
Glucose, Bld: 118 mg/dL — ABNORMAL HIGH (ref 70–99)
Potassium: 3.7 mmol/L (ref 3.5–5.1)
Sodium: 141 mmol/L (ref 135–145)
Total Bilirubin: <0.2 mg/dL — ABNORMAL LOW (ref 0.3–1.2)
Total Protein: 6.6 g/dL (ref 6.5–8.1)

## 2019-01-14 LAB — CBC WITH DIFFERENTIAL (CANCER CENTER ONLY)
Abs Immature Granulocytes: 1.1 10*3/uL — ABNORMAL HIGH (ref 0.00–0.07)
Basophils Absolute: 0 10*3/uL (ref 0.0–0.1)
Basophils Relative: 0 %
Eosinophils Absolute: 0 10*3/uL (ref 0.0–0.5)
Eosinophils Relative: 0 %
HCT: 33.3 % — ABNORMAL LOW (ref 36.0–46.0)
Hemoglobin: 10.5 g/dL — ABNORMAL LOW (ref 12.0–15.0)
Lymphocytes Relative: 2 %
Lymphs Abs: 0.4 10*3/uL — ABNORMAL LOW (ref 0.7–4.0)
MCH: 26.8 pg (ref 26.0–34.0)
MCHC: 31.5 g/dL (ref 30.0–36.0)
MCV: 84.9 fL (ref 80.0–100.0)
Metamyelocytes Relative: 1 %
Monocytes Absolute: 0.9 10*3/uL (ref 0.1–1.0)
Monocytes Relative: 4 %
Myelocytes: 4 %
Neutro Abs: 19.1 10*3/uL — ABNORMAL HIGH (ref 1.7–17.7)
Neutrophils Relative %: 89 %
Platelet Count: 229 10*3/uL (ref 150–400)
RBC: 3.92 MIL/uL (ref 3.87–5.11)
RDW: 15.9 % — ABNORMAL HIGH (ref 11.5–15.5)
WBC Count: 21.5 10*3/uL — ABNORMAL HIGH (ref 4.0–10.5)
nRBC: 0.2 % (ref 0.0–0.2)
nRBC: 1 /100 WBC — ABNORMAL HIGH

## 2019-01-14 MED ORDER — DOXORUBICIN HCL CHEMO IV INJECTION 2 MG/ML
60.0000 mg/m2 | Freq: Once | INTRAVENOUS | Status: AC
  Administered 2019-01-14: 92 mg via INTRAVENOUS
  Filled 2019-01-14: qty 46

## 2019-01-14 MED ORDER — HEPARIN SOD (PORK) LOCK FLUSH 100 UNIT/ML IV SOLN
500.0000 [IU] | Freq: Once | INTRAVENOUS | Status: AC | PRN
  Administered 2019-01-14: 500 [IU]
  Filled 2019-01-14: qty 5

## 2019-01-14 MED ORDER — SODIUM CHLORIDE 0.9% FLUSH
10.0000 mL | INTRAVENOUS | Status: DC | PRN
  Administered 2019-01-14: 09:00:00 10 mL
  Filled 2019-01-14: qty 10

## 2019-01-14 MED ORDER — SODIUM CHLORIDE 0.9 % IV SOLN
Freq: Once | INTRAVENOUS | Status: AC
  Administered 2019-01-14: 10:00:00 via INTRAVENOUS
  Filled 2019-01-14: qty 5

## 2019-01-14 MED ORDER — SODIUM CHLORIDE 0.9% FLUSH
10.0000 mL | INTRAVENOUS | Status: DC | PRN
  Administered 2019-01-14: 10 mL
  Filled 2019-01-14: qty 10

## 2019-01-14 MED ORDER — PALONOSETRON HCL INJECTION 0.25 MG/5ML
0.2500 mg | Freq: Once | INTRAVENOUS | Status: AC
  Administered 2019-01-14: 0.25 mg via INTRAVENOUS

## 2019-01-14 MED ORDER — PEGFILGRASTIM 6 MG/0.6ML ~~LOC~~ PSKT: 6.0000 mg | PREFILLED_SYRINGE | Freq: Once | SUBCUTANEOUS | Status: DC

## 2019-01-14 MED ORDER — SODIUM CHLORIDE 0.9 % IV SOLN
Freq: Once | INTRAVENOUS | Status: AC
  Administered 2019-01-14: 10:00:00 via INTRAVENOUS
  Filled 2019-01-14: qty 250

## 2019-01-14 MED ORDER — PEGFILGRASTIM 6 MG/0.6ML ~~LOC~~ PSKT
PREFILLED_SYRINGE | SUBCUTANEOUS | Status: AC
  Filled 2019-01-14: qty 0.6

## 2019-01-14 MED ORDER — SODIUM CHLORIDE 0.9 % IV SOLN
600.0000 mg/m2 | Freq: Once | INTRAVENOUS | Status: AC
  Administered 2019-01-14: 920 mg via INTRAVENOUS
  Filled 2019-01-14: qty 46

## 2019-01-14 MED ORDER — PALONOSETRON HCL INJECTION 0.25 MG/5ML
INTRAVENOUS | Status: AC
  Filled 2019-01-14: qty 5

## 2019-01-14 NOTE — Progress Notes (Signed)
Per Dr. Lindi Adie, proceed with growth factor support in spite of elevated WBC/ANC. Patient refused OnPro. Will return to clinic on 01/16/2019 for Neulasta injection. Message sent to scheduling to create appt. Patient aware of plan of care.

## 2019-01-14 NOTE — Patient Instructions (Signed)
[facility] Discharge Instructions for Patients Receiving Chemotherapy  Today you received the following chemotherapy agents Adriamycin and Cytoxan  To help prevent nausea and vomiting after your treatment, we encourage you to take your nausea medication as directed.  If you develop nausea and vomiting that is not controlled by your nausea medication, call the clinic.   BELOW ARE SYMPTOMS THAT SHOULD BE REPORTED IMMEDIATELY:  *FEVER GREATER THAN 100.5 F  *CHILLS WITH OR WITHOUT FEVER  NAUSEA AND VOMITING THAT IS NOT CONTROLLED WITH YOUR NAUSEA MEDICATION  *UNUSUAL SHORTNESS OF BREATH  *UNUSUAL BRUISING OR BLEEDING  TENDERNESS IN MOUTH AND THROAT WITH OR WITHOUT PRESENCE OF ULCERS  *URINARY PROBLEMS  *BOWEL PROBLEMS  UNUSUAL RASH Items with * indicate a potential emergency and should be followed up as soon as possible.  Feel free to call the clinic should you have any questions or concerns. The clinic phone number is [phone].  Please show the CHEMO ALERT CARD at check-in to the Emergency Department and triage nurse.   

## 2019-01-14 NOTE — Progress Notes (Signed)
MD ok'd switch from [place] to [place].  Pt pref.  PA approved.  Decrease clinic visits/exposure during [place]. Kennith Center, Pharm.D., CPP 01/14/2019@11 :35 AM

## 2019-01-16 ENCOUNTER — Inpatient Hospital Stay: Payer: BC Managed Care – PPO

## 2019-01-16 ENCOUNTER — Other Ambulatory Visit: Payer: Self-pay

## 2019-01-16 VITALS — BP 102/69 | HR 73 | Temp 97.4°F | Resp 19

## 2019-01-16 DIAGNOSIS — C50412 Malignant neoplasm of upper-outer quadrant of left female breast: Secondary | ICD-10-CM

## 2019-01-16 DIAGNOSIS — Z170 Estrogen receptor positive status [ER+]: Principal | ICD-10-CM

## 2019-01-16 DIAGNOSIS — Z5189 Encounter for other specified aftercare: Secondary | ICD-10-CM | POA: Diagnosis not present

## 2019-01-16 MED ORDER — PEGFILGRASTIM INJECTION 6 MG/0.6ML ~~LOC~~
6.0000 mg | PREFILLED_SYRINGE | Freq: Once | SUBCUTANEOUS | Status: AC
  Administered 2019-01-16: 6 mg via SUBCUTANEOUS

## 2019-01-16 MED ORDER — PEGFILGRASTIM INJECTION 6 MG/0.6ML ~~LOC~~
PREFILLED_SYRINGE | SUBCUTANEOUS | Status: AC
  Filled 2019-01-16: qty 0.6

## 2019-01-16 NOTE — Assessment & Plan Note (Signed)
11/13/2018:Left mastectomy: Grade 2 IDC, 1.6 cm, with intermediate grade DCIS, margins negative, lymphovascular invasion present, 1/7 lymph nodes positive, MammaPrint high risk, ER 100%, PR 30%, HER-2 negative, Ki-67 30% T1CN1A stage Ia  Treatment plan: 1.Adjuvant chemotherapy with dose since he remains on Cytoxan x4 followed by Taxol weekly x12 2.Followed by adjuvant radiation therapy 3.Followed by adjuvant antiestrogen therapy ------------------------------------------------------------------------------------------------------------------ Current Treatment: Today is cycle4day1DD [facility] Labs are reviewed  Chemo toxicities: 1.Mild nausea lasts 2-3 days after chemo with loss of taste 2.Vaginal dryness 3. Chemo induced anemia: Monitoring closely. No change in treatment yet. Discussed with her about slightly decreased protein levels and encouraged her to eat more protein rich diet/ take protein supplements  Return to clinicin2weeksfor cycle 1 Taxol

## 2019-01-16 NOTE — Patient Instructions (Signed)
Pegfilgrastim injection  What is this medicine?  PEGFILGRASTIM (PEG fil gra stim) is a long-acting granulocyte colony-stimulating factor that stimulates the growth of neutrophils, a type of white blood cell important in the body's fight against infection. It is used to reduce the incidence of fever and infection in patients with certain types of cancer who are receiving chemotherapy that affects the bone marrow, and to increase survival after being exposed to high doses of radiation.  This medicine may be used for other purposes; ask your health care provider or pharmacist if you have questions.  COMMON BRAND NAME(S): Fulphila, Neulasta, UDENYCA  What should I tell my health care provider before I take this medicine?  They need to know if you have any of these conditions:  -kidney disease  -latex allergy  -ongoing radiation therapy  -sickle cell disease  -skin reactions to acrylic adhesives (On-Body Injector only)  -an unusual or allergic reaction to pegfilgrastim, filgrastim, other medicines, foods, dyes, or preservatives  -pregnant or trying to get pregnant  -breast-feeding  How should I use this medicine?  This medicine is for injection under the skin. If you get this medicine at home, you will be taught how to prepare and give the pre-filled syringe or how to use the On-body Injector. Refer to the patient Instructions for Use for detailed instructions. Use exactly as directed. Tell your healthcare provider immediately if you suspect that the On-body Injector may not have performed as intended or if you suspect the use of the On-body Injector resulted in a missed or partial dose.  It is important that you put your used needles and syringes in a special sharps container. Do not put them in a trash can. If you do not have a sharps container, call your pharmacist or healthcare provider to get one.  Talk to your pediatrician regarding the use of this medicine in children. While this drug may be prescribed for  selected conditions, precautions do apply.  Overdosage: If you think you have taken too much of this medicine contact a poison control center or emergency room at once.  NOTE: This medicine is only for you. Do not share this medicine with others.  What if I miss a dose?  It is important not to miss your dose. Call your doctor or health care professional if you miss your dose. If you miss a dose due to an On-body Injector failure or leakage, a new dose should be administered as soon as possible using a single prefilled syringe for manual use.  What may interact with this medicine?  Interactions have not been studied.  Give your health care provider a list of all the medicines, herbs, non-prescription drugs, or dietary supplements you use. Also tell them if you smoke, drink alcohol, or use illegal drugs. Some items may interact with your medicine.  This list may not describe all possible interactions. Give your health care provider a list of all the medicines, herbs, non-prescription drugs, or dietary supplements you use. Also tell them if you smoke, drink alcohol, or use illegal drugs. Some items may interact with your medicine.  What should I watch for while using this medicine?  You may need blood work done while you are taking this medicine.  If you are going to need a MRI, CT scan, or other procedure, tell your doctor that you are using this medicine (On-Body Injector only).  What side effects may I notice from receiving this medicine?  Side effects that you should report to   your doctor or health care professional as soon as possible:  -allergic reactions like skin rash, itching or hives, swelling of the face, lips, or tongue  -back pain  -dizziness  -fever  -pain, redness, or irritation at site where injected  -pinpoint red spots on the skin  -red or dark-brown urine  -shortness of breath or breathing problems  -stomach or side pain, or pain at the shoulder  -swelling  -tiredness  -trouble passing urine or  change in the amount of urine  Side effects that usually do not require medical attention (report to your doctor or health care professional if they continue or are bothersome):  -bone pain  -muscle pain  This list may not describe all possible side effects. Call your doctor for medical advice about side effects. You may report side effects to FDA at 1-800-FDA-1088.  Where should I keep my medicine?  Keep out of the reach of children.  If you are using this medicine at home, you will be instructed on how to store it. Throw away any unused medicine after the expiration date on the label.  NOTE: This sheet is a summary. It may not cover all possible information. If you have questions about this medicine, talk to your doctor, pharmacist, or health care provider.   2019 Elsevier/Gold Standard (2017-12-25 16:57:08)

## 2019-01-27 NOTE — Progress Notes (Signed)
Patient Care Team: Emeterio Reeve, DO as PCP - General (Osteopathic Medicine) Rolm Bookbinder, MD as Consulting Physician (General Surgery) Nicholas Lose, MD as Consulting Physician (Hematology and Oncology) Eppie Gibson, MD as Attending Physician (Radiation Oncology)  DIAGNOSIS:    ICD-10-CM   1. Malignant neoplasm of upper-outer quadrant of left breast in female, estrogen receptor positive ([place]) C50.412    Z17.0     SUMMARY OF ONCOLOGIC HISTORY:   Malignant neoplasm of upper-outer quadrant of left breast in female, estrogen receptor positive ([place])   09/12/2018 Initial Diagnosis    Palpable left breast mass at 2:30 position by ultrasound measured 1.6 cm, calcifications spanning 5.9 cm, 3 abnormal lymph nodes, multiple masses and calcifications biopsy of the mass revealed grade 2 IDC plus DCIS with lymphovascular invasion, ER 100%, PR 60%, Ki-67 30%, HER-2 negative IHC 1+, pathology of calcifications is pending, biopsy of lymph nodes is discordant benign, T1cN0 stage Ia    09/19/2018 Cancer Staging    Staging form: Breast, AJCC 8th Edition - Clinical stage from 09/19/2018: Stage IA (cT1c(m), cN0, cM0, G2, ER+, PR+, HER2-) - Signed by Nicholas Lose, MD on 09/19/2018    11/13/2018 Surgery    Left mastectomy: Grade 2 IDC, 1.6 cm, with intermediate grade DCIS, margins negative, lymphovascular invasion present, 1/7 lymph nodes positive, MammaPrint high risk, ER 100%, PR 30%, HER-2 negative, Ki-67 30% T1CN1A stage Ia    11/13/2018 Cancer Staging    Staging form: Breast, AJCC 8th Edition - Pathologic stage from 11/13/2018: Stage IA (pT1c, pN1a, cM0, G2, ER+, PR+, HER2-) - Signed by Gardenia Phlegm, NP on 11/28/2018    12/17/2018 -  Chemotherapy    The patient had DOXOrubicin (ADRIAMYCIN) chemo injection 92 mg, 60 mg/m2 = 92 mg, Intravenous,  Once, 3 of 4 cycles Administration: 92 mg (12/17/2018), 92 mg (12/31/2018), 92 mg (01/14/2019) palonosetron (ALOXI) injection 0.25 mg, 0.25  mg, Intravenous,  Once, 3 of 4 cycles Administration: 0.25 mg (12/17/2018), 0.25 mg (12/31/2018), 0.25 mg (01/14/2019) pegfilgrastim (NEULASTA) injection 6 mg, 6 mg, Subcutaneous, Once, 1 of 2 cycles Administration: 6 mg (01/16/2019) pegfilgrastim (NEULASTA ONPRO KIT) injection 6 mg, 6 mg, Subcutaneous, Once, 1 of 1 cycle pegfilgrastim-cbqv (UDENYCA) injection 6 mg, 6 mg, Subcutaneous, Once, 2 of 2 cycles Administration: 6 mg (12/19/2018), 6 mg (01/02/2019) cyclophosphamide (CYTOXAN) 920 mg in sodium chloride 0.9 % 250 mL chemo infusion, 600 mg/m2 = 920 mg, Intravenous,  Once, 3 of 4 cycles Administration: 920 mg (12/17/2018), 920 mg (12/31/2018), 920 mg (01/14/2019) PACLitaxel (TAXOL) 120 mg in sodium chloride 0.9 % 250 mL chemo infusion (</= 58m/m2), 80 mg/m2 = 120 mg, Intravenous,  Once, 0 of 12 cycles fosaprepitant (EMEND) 150 mg, dexamethasone (DECADRON) 12 mg in sodium chloride 0.9 % 145 mL IVPB, , Intravenous,  Once, 3 of 4 cycles Administration:  (12/17/2018),  (12/31/2018),  (01/14/2019)  for chemotherapy treatment.      CHIEF COMPLIANT: Cycle 4 Adriamycin and Cytoxan  INTERVAL HISTORY: Heather [name] a 58y.o. with above-mentioned history of left breast cancer treated with mastectomy who is currently receiving adjuvant chemotherapy with dose dense Adriamycin and Cytoxan. She presents to the clinic alone today for cycle 4.  She has mild nausea and fatigue but otherwise tolerating the treatment extremely well.  Denies any fevers or chills.  REVIEW OF SYSTEMS:   Constitutional: Denies fevers, chills or abnormal weight loss Eyes: Denies blurriness of vision Ears, nose, mouth, throat, and face: Denies mucositis or sore throat Respiratory: Denies cough, dyspnea  or wheezes Cardiovascular: Denies palpitation, chest discomfort Gastrointestinal: Denies nausea, heartburn or change in bowel habits Skin: Denies abnormal skin rashes Lymphatics: Denies new lymphadenopathy or easy bruising  Neurological: Denies numbness, tingling or new weaknesses Behavioral/Psych: Mood is stable, no new changes  Extremities: No lower extremity edema Breast: denies any pain or lumps or nodules in either breasts All other systems were reviewed with the patient and are negative.  I have reviewed the past medical history, past surgical history, social history and family history with the patient and they are unchanged from previous note.  ALLERGIES:  has No Known Allergies.  MEDICATIONS:  Current Outpatient Medications  Medication Sig Dispense Refill  . dexamethasone (DECADRON) 4 MG tablet Take 1 tablet day after chemo and 1 tablet 2 days after chemo with food 8 tablet 0  . lidocaine-prilocaine (EMLA) cream Apply 1 application topically as needed. 30 g 0  . lisinopril-hydrochlorothiazide (PRINZIDE,ZESTORETIC) 10-12.5 MG tablet TAKE 1 TABLET BY MOUTH EVERY DAY 90 tablet 0  . LORazepam (ATIVAN) 0.5 MG tablet Take 1 tablet (0.5 mg total) by mouth at bedtime as needed for sleep. 30 tablet 0  . ondansetron (ZOFRAN) 8 MG tablet Take 1 tablet (8 mg total) by mouth 2 (two) times daily as needed. Start on the third day after chemotherapy. (Patient not taking: Reported on 12/04/2018) 30 tablet 1  . prochlorperazine (COMPAZINE) 10 MG tablet Take 1 tablet (10 mg total) by mouth every 6 (six) hours as needed (Nausea or vomiting). (Patient not taking: Reported on 12/04/2018) 30 tablet 1  . Vitamin D, Ergocalciferol, (DRISDOL) 1.25 MG (50000 UT) CAPS capsule Take 1 capsule (50,000 Units total) by mouth every 7 (seven) days. Take for 12 total doses(weeks) than can transition to 1000 units OTC supplement daily 12 capsule 0   No current facility-administered medications for this visit.    Facility-Administered Medications Ordered in Other Visits  Medication Dose Route Frequency Provider Last Rate Last Dose  . sodium chloride flush (NS) 0.9 % injection 10 mL  10 mL Intravenous PRN Nicholas Lose, MD   10 mL at 12/17/18  0907    PHYSICAL EXAMINATION: ECOG PERFORMANCE STATUS: 1 - Symptomatic but completely ambulatory  Vitals:   01/28/19 0900  BP: 137/68  Pulse: 87  Resp: 18  Temp: 98.1 F (36.7 C)   Filed Weights   01/28/19 0900  Weight: 111 lb 14.4 oz (50.8 kg)    GENERAL: alert, no distress and comfortable SKIN: skin color, texture, turgor are normal, no rashes or significant lesions EYES: normal, Conjunctiva are pink and non-injected, sclera clear OROPHARYNX: no exudate, no erythema and lips, buccal mucosa, and tongue normal  NECK: supple, thyroid normal size, non-tender, without nodularity LYMPH: no palpable lymphadenopathy in the cervical, axillary or inguinal LUNGS: clear to auscultation and percussion with normal breathing effort HEART: regular rate & rhythm and no murmurs and no lower extremity edema ABDOMEN: abdomen soft, non-tender and normal bowel sounds MUSCULOSKELETAL: no cyanosis of digits and no clubbing  NEURO: alert & oriented x 3 with fluent speech, no focal motor/sensory deficits EXTREMITIES: No lower extremity edema  LABORATORY DATA:  I have reviewed the data as listed CMP Latest Ref Rng & Units 01/14/2019 12/31/2018 12/24/2018  Glucose 70 - 99 mg/dL 118(H) 102(H) 133(H)  BUN 6 - 20 mg/dL 12 11 11   Creatinine 0.44 - 1.00 mg/dL 0.81 0.77 0.81  Sodium 135 - 145 mmol/L 141 142 140  Potassium 3.5 - 5.1 mmol/L 3.7 3.8 4.5  Chloride 98 -  111 mmol/L 103 105 101  CO2 22 - 32 mmol/L 27 27 28   Calcium 8.9 - 10.3 mg/dL 9.5 9.1 9.4  Total Protein 6.5 - 8.1 g/dL 6.6 6.3(L) 6.3(L)  Total Bilirubin 0.3 - 1.2 mg/dL <0.2(L) <0.2(L) 0.5  Alkaline Phos 38 - 126 U/L 116 104 78  AST 15 - 41 U/L 29 24 15   ALT 0 - 44 U/L 20 19 15     Lab Results  Component Value Date   WBC 25.9 (H) 01/28/2019   HGB 9.4 (L) 01/28/2019   HCT 30.1 (L) 01/28/2019   MCV 85.8 01/28/2019   PLT 222 01/28/2019   NEUTROABS PENDING 01/28/2019    ASSESSMENT & PLAN:  Malignant neoplasm of upper-outer quadrant  of left breast in female, estrogen receptor positive ([place]) 11/13/2018:Left mastectomy: Grade 2 IDC, 1.6 cm, with intermediate grade DCIS, margins negative, lymphovascular invasion present, 1/7 lymph nodes positive, MammaPrint high risk, ER 100%, PR 30%, HER-2 negative, Ki-67 30% T1CN1A stage Ia  Treatment plan: 1.Adjuvant chemotherapy with dose since he remains on Cytoxan x4 followed by Taxol weekly x12 2.Followed by adjuvant radiation therapy 3.Followed by adjuvant antiestrogen therapy ------------------------------------------------------------------------------------------------------------------ Current Treatment: Today is [id number] [facility] Labs are reviewed  Chemo toxicities: 1.Mild nausea lasts 2-3 days after chemo with loss of taste 2.Vaginal dryness 3. Chemo induced anemia: Monitoring closely. No change in treatment yet.  Hemoglobin is down to 9.4 today.   Return to clinicin2weeksfor cycle 1 Taxol    No orders of the defined types were placed in this encounter.  The patient has a good understanding of the overall plan. she agrees with it. she will call with any problems that may develop before the next visit here.  Nicholas Lose, MD 01/28/2019  Julious Oka Dorshimer am acting as scribe for Dr. Nicholas Lose.  I have reviewed the above documentation for accuracy and completeness, and I agree with the above.

## 2019-01-28 ENCOUNTER — Other Ambulatory Visit: Payer: Self-pay

## 2019-01-28 ENCOUNTER — Inpatient Hospital Stay (HOSPITAL_BASED_OUTPATIENT_CLINIC_OR_DEPARTMENT_OTHER): Payer: BC Managed Care – PPO | Admitting: Hematology and Oncology

## 2019-01-28 ENCOUNTER — Encounter: Payer: Self-pay | Admitting: *Deleted

## 2019-01-28 ENCOUNTER — Inpatient Hospital Stay: Payer: BC Managed Care – PPO

## 2019-01-28 VITALS — HR 81

## 2019-01-28 DIAGNOSIS — D6481 Anemia due to antineoplastic chemotherapy: Secondary | ICD-10-CM | POA: Diagnosis not present

## 2019-01-28 DIAGNOSIS — C50412 Malignant neoplasm of upper-outer quadrant of left female breast: Secondary | ICD-10-CM

## 2019-01-28 DIAGNOSIS — Z79899 Other long term (current) drug therapy: Secondary | ICD-10-CM

## 2019-01-28 DIAGNOSIS — Z170 Estrogen receptor positive status [ER+]: Secondary | ICD-10-CM | POA: Diagnosis not present

## 2019-01-28 DIAGNOSIS — R5383 Other fatigue: Secondary | ICD-10-CM

## 2019-01-28 DIAGNOSIS — R110 Nausea: Secondary | ICD-10-CM

## 2019-01-28 DIAGNOSIS — Z5189 Encounter for other specified aftercare: Secondary | ICD-10-CM | POA: Diagnosis not present

## 2019-01-28 DIAGNOSIS — Z95828 Presence of other vascular implants and grafts: Secondary | ICD-10-CM

## 2019-01-28 LAB — CBC WITH DIFFERENTIAL (CANCER CENTER ONLY)
Abs Immature Granulocytes: 1 10*3/uL — ABNORMAL HIGH (ref 0.00–0.07)
Band Neutrophils: 6 %
Basophils Absolute: 0 10*3/uL (ref 0.0–0.1)
Basophils Relative: 0 %
Eosinophils Absolute: 0 10*3/uL (ref 0.0–0.5)
Eosinophils Relative: 0 %
HCT: 30.1 % — ABNORMAL LOW (ref 36.0–46.0)
Hemoglobin: 9.4 g/dL — ABNORMAL LOW (ref 12.0–15.0)
Lymphocytes Relative: 6 %
Lymphs Abs: 1.6 10*3/uL (ref 0.7–4.0)
MCH: 26.8 pg (ref 26.0–34.0)
MCHC: 31.2 g/dL (ref 30.0–36.0)
MCV: 85.8 fL (ref 80.0–100.0)
Metamyelocytes Relative: 3 %
Monocytes Absolute: 1 10*3/uL (ref 0.1–1.0)
Monocytes Relative: 4 %
Myelocytes: 1 %
Neutro Abs: 22.3 10*3/uL — ABNORMAL HIGH (ref 1.7–17.7)
Neutrophils Relative %: 80 %
Platelet Count: 222 10*3/uL (ref 150–400)
RBC: 3.51 MIL/uL — ABNORMAL LOW (ref 3.87–5.11)
RDW: 17.5 % — ABNORMAL HIGH (ref 11.5–15.5)
WBC Count: 25.9 10*3/uL — ABNORMAL HIGH (ref 4.0–10.5)
nRBC: 0.2 % (ref 0.0–0.2)

## 2019-01-28 LAB — CMP (CANCER CENTER ONLY)
ALT: 11 U/L (ref 0–44)
AST: 24 U/L (ref 15–41)
Albumin: 3.5 g/dL (ref 3.5–5.0)
Alkaline Phosphatase: 115 U/L (ref 38–126)
Anion gap: 11 (ref 5–15)
BUN: 10 mg/dL (ref 6–20)
CO2: 27 mmol/L (ref 22–32)
Calcium: 8.7 mg/dL — ABNORMAL LOW (ref 8.9–10.3)
Chloride: 105 mmol/L (ref 98–111)
Creatinine: 0.81 mg/dL (ref 0.44–1.00)
GFR, Est AFR Am: >60 mL/min (ref >60)
GFR, Estimated: >60 mL/min (ref >60)
Glucose, Bld: 135 mg/dL — ABNORMAL HIGH (ref 70–99)
Potassium: 3.3 mmol/L — ABNORMAL LOW (ref 3.5–5.1)
Sodium: 143 mmol/L (ref 135–145)
Total Bilirubin: <0.2 mg/dL — ABNORMAL LOW (ref 0.3–1.2)
Total Protein: 6 g/dL — ABNORMAL LOW (ref 6.5–8.1)

## 2019-01-28 MED ORDER — SODIUM CHLORIDE 0.9 % IV SOLN
600.0000 mg/m2 | Freq: Once | INTRAVENOUS | Status: AC
  Administered 2019-01-28: 920 mg via INTRAVENOUS
  Filled 2019-01-28: qty 46

## 2019-01-28 MED ORDER — SODIUM CHLORIDE 0.9% FLUSH
10.0000 mL | INTRAVENOUS | Status: DC | PRN
  Administered 2019-01-28: 10 mL
  Filled 2019-01-28: qty 10

## 2019-01-28 MED ORDER — PALONOSETRON HCL INJECTION 0.25 MG/5ML
0.2500 mg | Freq: Once | INTRAVENOUS | Status: AC
  Administered 2019-01-28: 0.25 mg via INTRAVENOUS

## 2019-01-28 MED ORDER — SODIUM CHLORIDE 0.9 % IV SOLN
Freq: Once | INTRAVENOUS | Status: AC
  Administered 2019-01-28: 10:00:00 via INTRAVENOUS
  Filled 2019-01-28: qty 250

## 2019-01-28 MED ORDER — DOXORUBICIN HCL CHEMO IV INJECTION 2 MG/ML
60.0000 mg/m2 | Freq: Once | INTRAVENOUS | Status: AC
  Administered 2019-01-28: 92 mg via INTRAVENOUS
  Filled 2019-01-28: qty 46

## 2019-01-28 MED ORDER — PALONOSETRON HCL INJECTION 0.25 MG/5ML
INTRAVENOUS | Status: AC
  Filled 2019-01-28: qty 5

## 2019-01-28 MED ORDER — SODIUM CHLORIDE 0.9 % IV SOLN
Freq: Once | INTRAVENOUS | Status: AC
  Administered 2019-01-28: 10:00:00 via INTRAVENOUS
  Filled 2019-01-28: qty 5

## 2019-01-28 MED ORDER — HEPARIN SOD (PORK) LOCK FLUSH 100 UNIT/ML IV SOLN
500.0000 [IU] | Freq: Once | INTRAVENOUS | Status: AC | PRN
  Administered 2019-01-28: 500 [IU]
  Filled 2019-01-28: qty 5

## 2019-01-28 NOTE — Patient Instructions (Signed)
[facility] Discharge Instructions for Patients Receiving Chemotherapy  Today you received the following chemotherapy agents Adriamycin, Cytoxan.  To help prevent nausea and vomiting after your treatment, we encourage you to take your nausea medication as prescribed.   If you develop nausea and vomiting that is not controlled by your nausea medication, call the clinic.   BELOW ARE SYMPTOMS THAT SHOULD BE REPORTED IMMEDIATELY:  *FEVER GREATER THAN 100.5 F  *CHILLS WITH OR WITHOUT FEVER  NAUSEA AND VOMITING THAT IS NOT CONTROLLED WITH YOUR NAUSEA MEDICATION  *UNUSUAL SHORTNESS OF BREATH  *UNUSUAL BRUISING OR BLEEDING  TENDERNESS IN MOUTH AND THROAT WITH OR WITHOUT PRESENCE OF ULCERS  *URINARY PROBLEMS  *BOWEL PROBLEMS  UNUSUAL RASH Items with * indicate a potential emergency and should be followed up as soon as possible.  Feel free to call the clinic should you have any questions or concerns. The clinic phone number is [phone].  Please show the CHEMO ALERT CARD at check-in to the Emergency Department and triage nurse.   

## 2019-01-30 ENCOUNTER — Other Ambulatory Visit: Payer: Self-pay

## 2019-01-30 ENCOUNTER — Inpatient Hospital Stay: Payer: BC Managed Care – PPO

## 2019-01-30 VITALS — BP 124/75 | HR 85 | Temp 98.0°F | Resp 18

## 2019-01-30 DIAGNOSIS — Z170 Estrogen receptor positive status [ER+]: Principal | ICD-10-CM

## 2019-01-30 DIAGNOSIS — Z5189 Encounter for other specified aftercare: Secondary | ICD-10-CM | POA: Diagnosis not present

## 2019-01-30 DIAGNOSIS — C50412 Malignant neoplasm of upper-outer quadrant of left female breast: Secondary | ICD-10-CM

## 2019-01-30 MED ORDER — PEGFILGRASTIM INJECTION 6 MG/0.6ML ~~LOC~~
PREFILLED_SYRINGE | SUBCUTANEOUS | Status: AC
  Filled 2019-01-30: qty 0.6

## 2019-01-30 MED ORDER — PEGFILGRASTIM INJECTION 6 MG/0.6ML ~~LOC~~
6.0000 mg | PREFILLED_SYRINGE | Freq: Once | SUBCUTANEOUS | Status: AC
  Administered 2019-01-30: 13:00:00 6 mg via SUBCUTANEOUS

## 2019-01-30 NOTE — Patient Instructions (Signed)
Pegfilgrastim injection  What is this medicine?  PEGFILGRASTIM (PEG fil gra stim) is a long-acting granulocyte colony-stimulating factor that stimulates the growth of neutrophils, a type of white blood cell important in the body's fight against infection. It is used to reduce the incidence of fever and infection in patients with certain types of cancer who are receiving chemotherapy that affects the bone marrow, and to increase survival after being exposed to high doses of radiation.  This medicine may be used for other purposes; ask your health care provider or pharmacist if you have questions.  COMMON BRAND NAME(S): Fulphila, Neulasta, UDENYCA  What should I tell my health care provider before I take this medicine?  They need to know if you have any of these conditions:  -kidney disease  -latex allergy  -ongoing radiation therapy  -sickle cell disease  -skin reactions to acrylic adhesives (On-Body Injector only)  -an unusual or allergic reaction to pegfilgrastim, filgrastim, other medicines, foods, dyes, or preservatives  -pregnant or trying to get pregnant  -breast-feeding  How should I use this medicine?  This medicine is for injection under the skin. If you get this medicine at home, you will be taught how to prepare and give the pre-filled syringe or how to use the On-body Injector. Refer to the patient Instructions for Use for detailed instructions. Use exactly as directed. Tell your healthcare provider immediately if you suspect that the On-body Injector may not have performed as intended or if you suspect the use of the On-body Injector resulted in a missed or partial dose.  It is important that you put your used needles and syringes in a special sharps container. Do not put them in a trash can. If you do not have a sharps container, call your pharmacist or healthcare provider to get one.  Talk to your pediatrician regarding the use of this medicine in children. While this drug may be prescribed for  selected conditions, precautions do apply.  Overdosage: If you think you have taken too much of this medicine contact a poison control center or emergency room at once.  NOTE: This medicine is only for you. Do not share this medicine with others.  What if I miss a dose?  It is important not to miss your dose. Call your doctor or health care professional if you miss your dose. If you miss a dose due to an On-body Injector failure or leakage, a new dose should be administered as soon as possible using a single prefilled syringe for manual use.  What may interact with this medicine?  Interactions have not been studied.  Give your health care provider a list of all the medicines, herbs, non-prescription drugs, or dietary supplements you use. Also tell them if you smoke, drink alcohol, or use illegal drugs. Some items may interact with your medicine.  This list may not describe all possible interactions. Give your health care provider a list of all the medicines, herbs, non-prescription drugs, or dietary supplements you use. Also tell them if you smoke, drink alcohol, or use illegal drugs. Some items may interact with your medicine.  What should I watch for while using this medicine?  You may need blood work done while you are taking this medicine.  If you are going to need a MRI, CT scan, or other procedure, tell your doctor that you are using this medicine (On-Body Injector only).  What side effects may I notice from receiving this medicine?  Side effects that you should report to   your doctor or health care professional as soon as possible:  -allergic reactions like skin rash, itching or hives, swelling of the face, lips, or tongue  -back pain  -dizziness  -fever  -pain, redness, or irritation at site where injected  -pinpoint red spots on the skin  -red or dark-brown urine  -shortness of breath or breathing problems  -stomach or side pain, or pain at the shoulder  -swelling  -tiredness  -trouble passing urine or  change in the amount of urine  Side effects that usually do not require medical attention (report to your doctor or health care professional if they continue or are bothersome):  -bone pain  -muscle pain  This list may not describe all possible side effects. Call your doctor for medical advice about side effects. You may report side effects to FDA at 1-800-FDA-1088.  Where should I keep my medicine?  Keep out of the reach of children.  If you are using this medicine at home, you will be instructed on how to store it. Throw away any unused medicine after the expiration date on the label.  NOTE: This sheet is a summary. It may not cover all possible information. If you have questions about this medicine, talk to your doctor, pharmacist, or health care provider.   2019 Elsevier/Gold Standard (2017-12-25 16:57:08)

## 2019-02-07 NOTE — Assessment & Plan Note (Signed)
11/13/2018:Left mastectomy: Grade 2 IDC, 1.6 cm, with intermediate grade DCIS, margins negative, lymphovascular invasion present, 1/7 lymph nodes positive, MammaPrint high risk, ER 100%, PR 30%, HER-2 negative, Ki-67 30% T1CN1A stage Ia  Treatment plan: 1.Adjuvant chemotherapy with dose since he remains on Cytoxan x4 followed by Taxol weekly x12 2.Followed by adjuvant radiation therapy 3.Followed by adjuvant antiestrogen therapy ------------------------------------------------------------------------------------------------------------------ Current Treatment: Completed 4 cycles of dose dense Adriamycin and Cytoxan.  Today is cycle 1 of Taxol  Labs are reviewed  Chemo toxicities: 1.Mild nausea lasts 2-3 days after chemo with loss of taste 2.Vaginal dryness 3. Chemo induced anemia: Monitoring closely. No change in treatment yet.  Hemoglobin is down to 9.4 today.   Return to clinicin2weeksfor cycle 2 of Taxol and for toxicity evaluation.

## 2019-02-09 NOTE — Progress Notes (Signed)
Patient Care Team: Emeterio Reeve, DO as PCP - General (Osteopathic Medicine) Rolm Bookbinder, MD as Consulting Physician (General Surgery) Nicholas Lose, MD as Consulting Physician (Hematology and Oncology) Eppie Gibson, MD as Attending Physician (Radiation Oncology)  DIAGNOSIS:    ICD-10-CM   1. Malignant neoplasm of upper-outer quadrant of left breast in female, estrogen receptor positive ([place]) C50.412    Z17.0     SUMMARY OF ONCOLOGIC HISTORY:   Malignant neoplasm of upper-outer quadrant of left breast in female, estrogen receptor positive ([place])   09/12/2018 Initial Diagnosis    Palpable left breast mass at 2:30 position by ultrasound measured 1.6 cm, calcifications spanning 5.9 cm, 3 abnormal lymph nodes, multiple masses and calcifications biopsy of the mass revealed grade 2 IDC plus DCIS with lymphovascular invasion, ER 100%, PR 60%, Ki-67 30%, HER-2 negative IHC 1+, pathology of calcifications is pending, biopsy of lymph nodes is discordant benign, T1cN0 stage Ia    09/19/2018 Cancer Staging    Staging form: Breast, AJCC 8th Edition - Clinical stage from 09/19/2018: Stage IA (cT1c(m), cN0, cM0, G2, ER+, PR+, HER2-) - Signed by Nicholas Lose, MD on 09/19/2018    11/13/2018 Surgery    Left mastectomy: Grade 2 IDC, 1.6 cm, with intermediate grade DCIS, margins negative, lymphovascular invasion present, 1/7 lymph nodes positive, MammaPrint high risk, ER 100%, PR 30%, HER-2 negative, Ki-67 30% T1CN1A stage Ia    11/13/2018 Cancer Staging    Staging form: Breast, AJCC 8th Edition - Pathologic stage from 11/13/2018: Stage IA (pT1c, pN1a, cM0, G2, ER+, PR+, HER2-) - Signed by Gardenia Phlegm, NP on 11/28/2018    12/17/2018 -  Chemotherapy    The patient had DOXOrubicin (ADRIAMYCIN) chemo injection 92 mg, 60 mg/m2 = 92 mg, Intravenous,  Once, 4 of 4 cycles Administration: 92 mg (12/17/2018), 92 mg (12/31/2018), 92 mg (01/14/2019), 92 mg (01/28/2019) palonosetron (ALOXI)  injection 0.25 mg, 0.25 mg, Intravenous,  Once, 4 of 4 cycles Administration: 0.25 mg (12/17/2018), 0.25 mg (12/31/2018), 0.25 mg (01/14/2019), 0.25 mg (01/28/2019) pegfilgrastim (NEULASTA) injection 6 mg, 6 mg, Subcutaneous, Once, 2 of 2 cycles Administration: 6 mg (01/16/2019), 6 mg (01/30/2019) pegfilgrastim (NEULASTA ONPRO KIT) injection 6 mg, 6 mg, Subcutaneous, Once, 1 of 1 cycle pegfilgrastim-cbqv (UDENYCA) injection 6 mg, 6 mg, Subcutaneous, Once, 2 of 2 cycles Administration: 6 mg (12/19/2018), 6 mg (01/02/2019) cyclophosphamide (CYTOXAN) 920 mg in sodium chloride 0.9 % 250 mL chemo infusion, 600 mg/m2 = 920 mg, Intravenous,  Once, 4 of 4 cycles Administration: 920 mg (12/17/2018), 920 mg (12/31/2018), 920 mg (01/14/2019), 920 mg (01/28/2019) PACLitaxel (TAXOL) 120 mg in sodium chloride 0.9 % 250 mL chemo infusion (</= 70m/m2), 80 mg/m2 = 120 mg, Intravenous,  Once, 0 of 12 cycles fosaprepitant (EMEND) 150 mg, dexamethasone (DECADRON) 12 mg in sodium chloride 0.9 % 145 mL IVPB, , Intravenous,  Once, 4 of 4 cycles Administration:  (12/17/2018),  (12/31/2018),  (01/14/2019),  (01/28/2019)  for chemotherapy treatment.      CHIEF COMPLIANT: Cycle 1 Taxol  INTERVAL HISTORY: Heather VDawne Casaliis a 58y.o. with above-mentioned history of left breast cancer treated with mastectomy who comeleted 4 cycles of adjuvant chemotherapy with dose dense Adriamycin and Cytoxan and is currently receiving weekly Taxol.She presents to the clinicalone today for cycle 1.  Her biggest complaint is weight loss with chemo.  She had lost about 7 pounds.  She has poor appetite and taste which limits her ability to eat.  She has been drinking Ensure.  REVIEW  OF SYSTEMS:   Constitutional: Denies fevers, chills or abnormal weight loss Eyes: Denies blurriness of vision Ears, nose, mouth, throat, and face: Denies mucositis or sore throat Respiratory: Denies cough, dyspnea or wheezes Cardiovascular: Denies palpitation, chest  discomfort Gastrointestinal: Nausea is better Skin: Denies abnormal skin rashes Lymphatics: Denies new lymphadenopathy or easy bruising Neurological: Denies numbness, tingling or new weaknesses Behavioral/Psych: Mood is stable, no new changes  Extremities: No lower extremity edema Breast: denies any pain or lumps or nodules in either breasts All other systems were reviewed with the patient and are negative.  I have reviewed the past medical history, past surgical history, social history and family history with the patient and they are unchanged from previous note.  ALLERGIES:  has No Known Allergies.  MEDICATIONS:  Current Outpatient Medications  Medication Sig Dispense Refill   dexamethasone (DECADRON) 4 MG tablet Take 1 tablet day after chemo and 1 tablet 2 days after chemo with food 8 tablet 0   lidocaine-prilocaine (EMLA) cream Apply 1 application topically as needed. 30 g 0   lisinopril-hydrochlorothiazide (PRINZIDE,ZESTORETIC) 10-12.5 MG tablet TAKE 1 TABLET BY MOUTH EVERY DAY 90 tablet 0   LORazepam (ATIVAN) 0.5 MG tablet Take 1 tablet (0.5 mg total) by mouth at bedtime as needed for sleep. 30 tablet 0   ondansetron (ZOFRAN) 8 MG tablet Take 1 tablet (8 mg total) by mouth 2 (two) times daily as needed. Start on the third day after chemotherapy. (Patient not taking: Reported on 12/04/2018) 30 tablet 1   prochlorperazine (COMPAZINE) 10 MG tablet Take 1 tablet (10 mg total) by mouth every 6 (six) hours as needed (Nausea or vomiting). (Patient not taking: Reported on 12/04/2018) 30 tablet 1   Vitamin D, Ergocalciferol, (DRISDOL) 1.25 MG (50000 UT) CAPS capsule Take 1 capsule (50,000 Units total) by mouth every 7 (seven) days. Take for 12 total doses(weeks) than can transition to 1000 units OTC supplement daily 12 capsule 0   No current facility-administered medications for this visit.    Facility-Administered Medications Ordered in Other Visits  Medication Dose Route Frequency  Provider Last Rate Last Dose   sodium chloride flush (NS) 0.9 % injection 10 mL  10 mL Intravenous PRN Nicholas Lose, MD   10 mL at 12/17/18 0907    PHYSICAL EXAMINATION: ECOG PERFORMANCE STATUS: 1 - Symptomatic but completely ambulatory  Vitals:   02/11/19 1013  BP: 118/70  Pulse: 91  Resp: 18  Temp: 98.7 F (37.1 C)  SpO2: 100%   Filed Weights   02/11/19 1013  Weight: 106 lb 14.4 oz (48.5 kg)    GENERAL: alert, no distress and comfortable SKIN: skin color, texture, turgor are normal, no rashes or significant lesions EYES: normal, Conjunctiva are pink and non-injected, sclera clear OROPHARYNX: no exudate, no erythema and lips, buccal mucosa, and tongue normal  NECK: supple, thyroid normal size, non-tender, without nodularity LYMPH: no palpable lymphadenopathy in the cervical, axillary or inguinal LUNGS: clear to auscultation and percussion with normal breathing effort HEART: regular rate & rhythm and no murmurs and no lower extremity edema ABDOMEN: abdomen soft, non-tender and normal bowel sounds MUSCULOSKELETAL: no cyanosis of digits and no clubbing  NEURO: alert & oriented x 3 with fluent speech, no focal motor/sensory deficits EXTREMITIES: No lower extremity edema  LABORATORY DATA:  I have reviewed the data as listed CMP Latest Ref Rng & Units 01/28/2019 01/14/2019 12/31/2018  Glucose 70 - 99 mg/dL 135(H) 118(H) 102(H)  BUN 6 - 20 mg/dL 10 12 11  Creatinine 0.44 - 1.00 mg/dL 0.81 0.81 0.77  Sodium 135 - 145 mmol/L 143 141 142  Potassium 3.5 - 5.1 mmol/L 3.3(L) 3.7 3.8  Chloride 98 - 111 mmol/L 105 103 105  CO2 22 - 32 mmol/L 27 27 27   Calcium 8.9 - 10.3 mg/dL 8.7(L) 9.5 9.1  Total Protein 6.5 - 8.1 g/dL 6.0(L) 6.6 6.3(L)  Total Bilirubin 0.3 - 1.2 mg/dL <0.2(L) <0.2(L) <0.2(L)  Alkaline Phos 38 - 126 U/L 115 116 104  AST 15 - 41 U/L 24 29 24   ALT 0 - 44 U/L 11 20 19     Lab Results  Component Value Date   WBC 20.5 (H) 02/11/2019   HGB 9.3 (L) 02/11/2019   HCT  30.0 (L) 02/11/2019   MCV 86.7 02/11/2019   PLT 234 02/11/2019   NEUTROABS PENDING 02/11/2019    ASSESSMENT & PLAN:  Malignant neoplasm of upper-outer quadrant of left breast in female, estrogen receptor positive ([place]) 11/13/2018:Left mastectomy: Grade 2 IDC, 1.6 cm, with intermediate grade DCIS, margins negative, lymphovascular invasion present, 1/7 lymph nodes positive, MammaPrint high risk, ER 100%, PR 30%, HER-2 negative, Ki-67 30% T1CN1A stage Ia  Treatment plan: 1.Adjuvant chemotherapy with dose since he remains on Cytoxan x4 followed by Taxol weekly x12 2.Followed by adjuvant radiation therapy 3.Followed by adjuvant antiestrogen therapy ------------------------------------------------------------------------------------------------------------------ Current Treatment: Completed 4 cycles of dose dense Adriamycin and Cytoxan.  Today is cycle 1 of Taxol  Labs are reviewed  Chemo toxicities: 1.Mild nausea lasts 2-3 days after chemo with loss of taste 2.Vaginal dryness 3. Chemo induced anemia: Monitoring closely. No change in treatment yet.  Hemoglobin is down to 9.3 today.   Weight loss: Patient is trying to add Ensure to her diet.  She has poor appetite and lack of taste.  I discussed with her that appetite stimulants do carry side effects and I would like to see how she does during Taxol treatments before adding an appetite stimulant.  Return to clinicin2weeksfor cycle 2 of Taxol and for toxicity evaluation.  No orders of the defined types were placed in this encounter.  The patient has a good understanding of the overall plan. she agrees with it. she will call with any problems that may develop before the next visit here.  Nicholas Lose, MD 02/11/2019  Julious Oka Dorshimer am acting as scribe for Dr. Nicholas Lose.  I have reviewed the above documentation for accuracy and completeness, and I agree with the above.

## 2019-02-11 ENCOUNTER — Inpatient Hospital Stay: Payer: BC Managed Care – PPO

## 2019-02-11 ENCOUNTER — Encounter: Payer: Self-pay | Admitting: *Deleted

## 2019-02-11 ENCOUNTER — Other Ambulatory Visit: Payer: Self-pay

## 2019-02-11 ENCOUNTER — Inpatient Hospital Stay (HOSPITAL_BASED_OUTPATIENT_CLINIC_OR_DEPARTMENT_OTHER): Payer: BC Managed Care – PPO | Admitting: Hematology and Oncology

## 2019-02-11 ENCOUNTER — Inpatient Hospital Stay: Payer: BC Managed Care – PPO | Attending: Genetic Counselor

## 2019-02-11 VITALS — BP 99/85 | HR 79 | Temp 98.9°F | Resp 18

## 2019-02-11 DIAGNOSIS — R634 Abnormal weight loss: Secondary | ICD-10-CM | POA: Diagnosis not present

## 2019-02-11 DIAGNOSIS — D6481 Anemia due to antineoplastic chemotherapy: Secondary | ICD-10-CM

## 2019-02-11 DIAGNOSIS — C50412 Malignant neoplasm of upper-outer quadrant of left female breast: Secondary | ICD-10-CM | POA: Insufficient documentation

## 2019-02-11 DIAGNOSIS — Z5111 Encounter for antineoplastic chemotherapy: Secondary | ICD-10-CM | POA: Diagnosis present

## 2019-02-11 DIAGNOSIS — Z170 Estrogen receptor positive status [ER+]: Secondary | ICD-10-CM

## 2019-02-11 DIAGNOSIS — Z9012 Acquired absence of left breast and nipple: Secondary | ICD-10-CM

## 2019-02-11 DIAGNOSIS — Z95828 Presence of other vascular implants and grafts: Secondary | ICD-10-CM

## 2019-02-11 LAB — CBC WITH DIFFERENTIAL (CANCER CENTER ONLY)
Abs Immature Granulocytes: 1.93 10*3/uL — ABNORMAL HIGH (ref 0.00–0.07)
Basophils Absolute: 0.1 10*3/uL (ref 0.0–0.1)
Basophils Relative: 0 %
Eosinophils Absolute: 0 10*3/uL (ref 0.0–0.5)
Eosinophils Relative: 0 %
HCT: 30 % — ABNORMAL LOW (ref 36.0–46.0)
Hemoglobin: 9.3 g/dL — ABNORMAL LOW (ref 12.0–15.0)
Immature Granulocytes: 9 %
Lymphocytes Relative: 4 %
Lymphs Abs: 0.7 10*3/uL (ref 0.7–4.0)
MCH: 26.9 pg (ref 26.0–34.0)
MCHC: 31 g/dL (ref 30.0–36.0)
MCV: 86.7 fL (ref 80.0–100.0)
Monocytes Absolute: 1.6 10*3/uL — ABNORMAL HIGH (ref 0.1–1.0)
Monocytes Relative: 8 %
Neutro Abs: 16.2 10*3/uL — ABNORMAL HIGH (ref 1.7–7.7)
Neutrophils Relative %: 79 %
Platelet Count: 234 10*3/uL (ref 150–400)
RBC: 3.46 MIL/uL — ABNORMAL LOW (ref 3.87–5.11)
RDW: 18.6 % — ABNORMAL HIGH (ref 11.5–15.5)
WBC Count: 20.5 10*3/uL — ABNORMAL HIGH (ref 4.0–10.5)
nRBC: 0 % (ref 0.0–0.2)

## 2019-02-11 LAB — CMP (CANCER CENTER ONLY)
ALT: 12 U/L (ref 0–44)
AST: 24 U/L (ref 15–41)
Albumin: 3.7 g/dL (ref 3.5–5.0)
Alkaline Phosphatase: 113 U/L (ref 38–126)
Anion gap: 10 (ref 5–15)
BUN: 11 mg/dL (ref 6–20)
CO2: 29 mmol/L (ref 22–32)
Calcium: 9.6 mg/dL (ref 8.9–10.3)
Chloride: 102 mmol/L (ref 98–111)
Creatinine: 0.78 mg/dL (ref 0.44–1.00)
GFR, Est AFR Am: >60 mL/min (ref >60)
GFR, Estimated: >60 mL/min (ref >60)
Glucose, Bld: 120 mg/dL — ABNORMAL HIGH (ref 70–99)
Potassium: 3.9 mmol/L (ref 3.5–5.1)
Sodium: 141 mmol/L (ref 135–145)
Total Bilirubin: <0.2 mg/dL — ABNORMAL LOW (ref 0.3–1.2)
Total Protein: 6.5 g/dL (ref 6.5–8.1)

## 2019-02-11 MED ORDER — SODIUM CHLORIDE 0.9 % IV SOLN
Freq: Once | INTRAVENOUS | Status: AC
  Administered 2019-02-11: 11:00:00 via INTRAVENOUS
  Filled 2019-02-11: qty 250

## 2019-02-11 MED ORDER — DIPHENHYDRAMINE HCL 50 MG/ML IJ SOLN
INTRAMUSCULAR | Status: AC
  Filled 2019-02-11: qty 1

## 2019-02-11 MED ORDER — SODIUM CHLORIDE 0.9 % IV SOLN
80.0000 mg/m2 | Freq: Once | INTRAVENOUS | Status: AC
  Administered 2019-02-11: 12:00:00 120 mg via INTRAVENOUS
  Filled 2019-02-11: qty 20

## 2019-02-11 MED ORDER — HEPARIN SOD (PORK) LOCK FLUSH 100 UNIT/ML IV SOLN
500.0000 [IU] | Freq: Once | INTRAVENOUS | Status: AC | PRN
  Administered 2019-02-11: 14:00:00 500 [IU]
  Filled 2019-02-11: qty 5

## 2019-02-11 MED ORDER — DIPHENHYDRAMINE HCL 50 MG/ML IJ SOLN
50.0000 mg | Freq: Once | INTRAMUSCULAR | Status: AC
  Administered 2019-02-11: 11:00:00 50 mg via INTRAVENOUS

## 2019-02-11 MED ORDER — SODIUM CHLORIDE 0.9 % IV SOLN
20.0000 mg | Freq: Once | INTRAVENOUS | Status: AC
  Administered 2019-02-11: 20 mg via INTRAVENOUS
  Filled 2019-02-11: qty 2

## 2019-02-11 MED ORDER — SODIUM CHLORIDE 0.9% FLUSH
10.0000 mL | INTRAVENOUS | Status: DC | PRN
  Administered 2019-02-11: 14:00:00 10 mL
  Filled 2019-02-11: qty 10

## 2019-02-11 MED ORDER — FAMOTIDINE IN NACL 20-0.9 MG/50ML-% IV SOLN
INTRAVENOUS | Status: AC
  Filled 2019-02-11: qty 50

## 2019-02-11 MED ORDER — FAMOTIDINE IN NACL 20-0.9 MG/50ML-% IV SOLN
20.0000 mg | Freq: Once | INTRAVENOUS | Status: AC
  Administered 2019-02-11: 11:00:00 20 mg via INTRAVENOUS

## 2019-02-11 MED ORDER — SODIUM CHLORIDE 0.9% FLUSH
10.0000 mL | INTRAVENOUS | Status: DC | PRN
  Administered 2019-02-11: 10 mL
  Filled 2019-02-11: qty 10

## 2019-02-11 NOTE — Patient Instructions (Signed)
[facility] Discharge Instructions for Patients Receiving Chemotherapy  Today you received the following chemotherapy agents: Taxol   To help prevent nausea and vomiting after your treatment, we encourage you to take your nausea medication as directed.    If you develop nausea and vomiting that is not controlled by your nausea medication, call the clinic.   BELOW ARE SYMPTOMS THAT SHOULD BE REPORTED IMMEDIATELY:  *FEVER GREATER THAN 100.5 F  *CHILLS WITH OR WITHOUT FEVER  NAUSEA AND VOMITING THAT IS NOT CONTROLLED WITH YOUR NAUSEA MEDICATION  *UNUSUAL SHORTNESS OF BREATH  *UNUSUAL BRUISING OR BLEEDING  TENDERNESS IN MOUTH AND THROAT WITH OR WITHOUT PRESENCE OF ULCERS  *URINARY PROBLEMS  *BOWEL PROBLEMS  UNUSUAL RASH Items with * indicate a potential emergency and should be followed up as soon as possible.  Feel free to call the clinic should you have any questions or concerns. The clinic phone number is [phone].  Please show the [facility] at check-in to the Emergency Department and triage nurse.  Paclitaxel injection What is this medicine? PACLITAXEL (PAK li TAX el) is a chemotherapy drug. It targets fast dividing cells, like cancer cells, and causes these cells to die. This medicine is used to treat ovarian cancer, breast cancer, lung cancer, Kaposi's sarcoma, and other cancers. This medicine may be used for other purposes; ask your health care provider or pharmacist if you have questions. COMMON BRAND NAME(S): Onxol, Taxol What should I tell my health care provider before I take this medicine? They need to know if you have any of these conditions: -history of irregular heartbeat -liver disease -low blood counts, like low white cell, platelet, or red cell counts -lung or breathing disease, like asthma -tingling of the fingers or toes, or other nerve disorder -an unusual or allergic reaction to paclitaxel, alcohol, polyoxyethylated castor oil,  other chemotherapy, other medicines, foods, dyes, or preservatives -pregnant or trying to get pregnant -breast-feeding How should I use this medicine? This drug is given as an infusion into a vein. It is administered in a hospital or clinic by a specially trained health care professional. Talk to your pediatrician regarding the use of this medicine in children. Special care may be needed. Overdosage: If you think you have taken too much of this medicine contact a poison control center or emergency room at once. NOTE: This medicine is only for you. Do not share this medicine with others. What if I miss a dose? It is important not to miss your dose. Call your doctor or health care professional if you are unable to keep an appointment. What may interact with this medicine? Do not take this medicine with any of the following medications: -disulfiram -metronidazole This medicine may also interact with the following medications: -antiviral medicines for hepatitis, HIV or AIDS -certain antibiotics like erythromycin and clarithromycin -certain medicines for fungal infections like ketoconazole and itraconazole -certain medicines for seizures like carbamazepine, phenobarbital, phenytoin -gemfibrozil -nefazodone -rifampin -St. John's wort This list may not describe all possible interactions. Give your health care provider a list of all the medicines, herbs, non-prescription drugs, or dietary supplements you use. Also tell them if you smoke, drink alcohol, or use illegal drugs. Some items may interact with your medicine. What should I watch for while using this medicine? Your condition will be monitored carefully while you are receiving this medicine. You will need important blood work done while you are taking this medicine. This medicine can cause serious allergic reactions. To reduce  your risk you will need to take other medicine(s) before treatment with this medicine. If you experience allergic  reactions like skin rash, itching or hives, swelling of the face, lips, or tongue, tell your doctor or health care professional right away. In some cases, you may be given additional medicines to help with side effects. Follow all directions for their use. This drug may make you feel generally unwell. This is not uncommon, as chemotherapy can affect healthy cells as well as cancer cells. Report any side effects. Continue your course of treatment even though you feel ill unless your doctor tells you to stop. Call your doctor or health care professional for advice if you get a fever, chills or sore throat, or other symptoms of a cold or flu. Do not treat yourself. This drug decreases your body's ability to fight infections. Try to avoid being around people who are sick. This medicine may increase your risk to bruise or bleed. Call your doctor or health care professional if you notice any unusual bleeding. Be careful brushing and flossing your teeth or using a toothpick because you may get an infection or bleed more easily. If you have any dental work done, tell your dentist you are receiving this medicine. Avoid taking products that contain aspirin, acetaminophen, ibuprofen, naproxen, or ketoprofen unless instructed by your doctor. These medicines may hide a fever. Do not become pregnant while taking this medicine. Women should inform their doctor if they wish to become pregnant or think they might be pregnant. There is a potential for serious side effects to an unborn child. Talk to your health care professional or pharmacist for more information. Do not breast-feed an infant while taking this medicine. Men are advised not to father a child while receiving this medicine. This product may contain alcohol. Ask your pharmacist or healthcare provider if this medicine contains alcohol. Be sure to tell all healthcare providers you are taking this medicine. Certain medicines, like metronidazole and disulfiram, can  cause an unpleasant reaction when taken with alcohol. The reaction includes flushing, headache, nausea, vomiting, sweating, and increased thirst. The reaction can last from 30 minutes to several hours. What side effects may I notice from receiving this medicine? Side effects that you should report to your doctor or health care professional as soon as possible: -allergic reactions like skin rash, itching or hives, swelling of the face, lips, or tongue -breathing problems -changes in vision -fast, irregular heartbeat -high or low blood pressure -mouth sores -pain, tingling, numbness in the hands or feet -signs of decreased platelets or bleeding - bruising, pinpoint red spots on the skin, black, tarry stools, blood in the urine -signs of decreased red blood cells - unusually weak or tired, feeling faint or lightheaded, falls -signs of infection - fever or chills, cough, sore throat, pain or difficulty passing urine -signs and symptoms of liver injury like dark yellow or brown urine; general ill feeling or flu-like symptoms; light-colored stools; loss of appetite; nausea; right upper belly pain; unusually weak or tired; yellowing of the eyes or skin -swelling of the ankles, feet, hands -unusually slow heartbeat Side effects that usually do not require medical attention (report to your doctor or health care professional if they continue or are bothersome): -diarrhea -hair loss -loss of appetite -muscle or joint pain -nausea, vomiting -pain, redness, or irritation at site where injected -tiredness This list may not describe all possible side effects. Call your doctor for medical advice about side effects. You may report side effects  to FDA at 1-800-FDA-1088. Where should I keep my medicine? This drug is given in a hospital or clinic and will not be stored at home. NOTE: This sheet is a summary. It may not cover all possible information. If you have questions about this medicine, talk to your  doctor, pharmacist, or health care provider.  2019 Elsevier/Gold Standard (2017-05-23 13:14:55)

## 2019-02-12 ENCOUNTER — Telehealth: Payer: Self-pay | Admitting: *Deleted

## 2019-02-12 NOTE — Telephone Encounter (Signed)
-----   Message from Lillia Corporal, RN sent at 02/11/2019  3:39 PM EDT ----- Regarding: Lindi Adie 1st chemo f/u (New med) New regimen of taxol.

## 2019-02-12 NOTE — Telephone Encounter (Signed)
Attempted call to patient to follow up with her after her 1st chemotherapy treatment yesterday. No answer but was able to leave vm message for pt to call back @ [phone] if she has any questions or concerns related to her treatment or any possible side effects she may be experiencing

## 2019-02-12 NOTE — Assessment & Plan Note (Signed)
11/13/2018:Left mastectomy: Grade 2 IDC, 1.6 cm, with intermediate grade DCIS, margins negative, lymphovascular invasion present, 1/7 lymph nodes positive, MammaPrint high risk, ER 100%, PR 30%, HER-2 negative, Ki-67 30% T1CN1A stage Ia  Treatment plan: 1.Adjuvant chemotherapy with dose since he remains on Cytoxan x4 followed by Taxol weekly x12 2.Followed by adjuvant radiation therapy 3.Followed by adjuvant antiestrogen therapy ------------------------------------------------------------------------------------------------------------------ Current Treatment: Completed 4 cycles of dose dense Adriamycin and Cytoxan.  Today is cycle 2 of Taxol  Labs are reviewed  Chemo toxicities: 1.Mild nausea lasts 2-3 days after chemo with loss of taste 2.Vaginal dryness 3. Chemo induced anemia: Monitoring closely. No change in treatment yet.Hemoglobin is down to 9.3 today.  Weight loss: Patient is trying to add Ensure to her diet.  She has poor appetite and lack of taste.  I discussed with her that appetite stimulants do carry side effects and I would like to see how she does during Taxol treatments before adding an appetite stimulant.  Return to clinicinweekly for Taxol and every other week for follow-up with me.

## 2019-02-16 NOTE — Progress Notes (Signed)
[name] Care Team: Emeterio Reeve, DO as PCP - General (Osteopathic Medicine) Rolm Bookbinder, MD as Consulting Physician (General Surgery) Nicholas Lose, MD as Consulting Physician (Hematology and Oncology) Eppie Gibson, MD as Attending Physician (Radiation Oncology)  DIAGNOSIS:    ICD-10-CM   1. Malignant neoplasm of upper-outer quadrant of left breast in female, estrogen receptor positive ([facility]) C50.412    Z17.0    SUMMARY OF ONCOLOGIC HISTORY:   Malignant neoplasm of upper-outer quadrant of left breast in female, estrogen receptor positive ([facility])   09/12/2018 Initial Diagnosis    Palpable left breast mass at 2:30 position by ultrasound measured 1.6 cm, calcifications spanning 5.9 cm, 3 abnormal lymph nodes, multiple masses and calcifications biopsy of Heather mass revealed grade 2 IDC plus DCIS with lymphovascular invasion, ER 100%, PR 60%, Ki-67 30%, HER-2 negative IHC 1+, pathology of calcifications is pending, biopsy of lymph nodes is discordant benign, T1cN0 stage Ia    09/19/2018 Cancer Staging    Staging form: Breast, AJCC 8th Edition - Clinical stage from 09/19/2018: Stage IA (cT1c(m), cN0, cM0, G2, ER+, PR+, HER2-) - Signed by Nicholas Lose, MD on 09/19/2018    11/13/2018 Surgery    Left mastectomy: Grade 2 IDC, 1.6 cm, with intermediate grade DCIS, margins negative, lymphovascular invasion present, 1/7 lymph nodes positive, MammaPrint high risk, ER 100%, PR 30%, HER-2 negative, Ki-67 30% T1CN1A stage Ia    11/13/2018 Cancer Staging    Staging form: Breast, AJCC 8th Edition - Pathologic stage from 11/13/2018: Stage IA (pT1c, pN1a, cM0, G2, ER+, PR+, HER2-) - Signed by Gardenia Phlegm, NP on 11/28/2018    12/17/2018 -  Chemotherapy    Heather [name] had DOXOrubicin (ADRIAMYCIN) chemo injection 92 mg, 60 mg/m2 = 92 mg, Intravenous,  Once, 4 of 4 cycles Administration: 92 mg (12/17/2018), 92 mg (12/31/2018), 92 mg (01/14/2019), 92 mg (01/28/2019) palonosetron (ALOXI)  injection 0.25 mg, 0.25 mg, Intravenous,  Once, 4 of 4 cycles Administration: 0.25 mg (12/17/2018), 0.25 mg (12/31/2018), 0.25 mg (01/14/2019), 0.25 mg (01/28/2019) pegfilgrastim (NEULASTA) injection 6 mg, 6 mg, Subcutaneous, Once, 2 of 2 cycles Administration: 6 mg (01/16/2019), 6 mg (01/30/2019) pegfilgrastim (NEULASTA ONPRO KIT) injection 6 mg, 6 mg, Subcutaneous, Once, 1 of 1 cycle pegfilgrastim-cbqv (UDENYCA) injection 6 mg, 6 mg, Subcutaneous, Once, 2 of 2 cycles Administration: 6 mg (12/19/2018), 6 mg (01/02/2019) cyclophosphamide (CYTOXAN) 920 mg in sodium chloride 0.9 % 250 mL chemo infusion, 600 mg/m2 = 920 mg, Intravenous,  Once, 4 of 4 cycles Administration: 920 mg (12/17/2018), 920 mg (12/31/2018), 920 mg (01/14/2019), 920 mg (01/28/2019) PACLitaxel (TAXOL) 120 mg in sodium chloride 0.9 % 250 mL chemo infusion (</= 34m/m2), 80 mg/m2 = 120 mg, Intravenous,  Once, 1 of 12 cycles Administration: 120 mg (02/11/2019) fosaprepitant (EMEND) 150 mg, dexamethasone (DECADRON) 12 mg in sodium chloride 0.9 % 145 mL IVPB, , Intravenous,  Once, 4 of 4 cycles Administration:  (12/17/2018),  (12/31/2018),  (01/14/2019),  (01/28/2019)  for chemotherapy treatment.      CHIEF COMPLIANT: Cycle 2 Taxol  INTERVAL HISTORY: Heather [name] a 58y.o. with above-mentioned history of left breast cancer treated with mastectomy who completed 4 cycles of adjuvant chemotherapy with dose dense Adriamycin and Cytoxan and is currently receiving weekly Taxol.She [facility] to [facility] today for cycle2.  REVIEW OF SYSTEMS:   Constitutional: Denies fevers, chills or abnormal weight loss Eyes: Denies blurriness of vision Ears, nose, mouth, throat, and face: Denies mucositis or sore throat Respiratory: Denies cough, dyspnea or  wheezes Cardiovascular: Denies palpitation, chest discomfort Gastrointestinal: Denies nausea, heartburn or change in bowel habits Skin: Denies abnormal skin rashes Lymphatics: Denies new  lymphadenopathy or easy bruising Neurological: Denies numbness, tingling or new weaknesses Behavioral/Psych: Mood is stable, no new changes  Extremities: No lower extremity edema Breast: denies any pain or lumps or nodules in either breasts All other systems were reviewed with Heather [name] and are negative.  I have reviewed Heather past medical history, past surgical history, social history and family history with Heather [name] and they are unchanged from previous note.  ALLERGIES:  has No Known Allergies.  MEDICATIONS:  Current Outpatient Medications  Medication Sig Dispense Refill   lidocaine-prilocaine (EMLA) cream Apply 1 application topically as needed. 30 g 0   lisinopril-hydrochlorothiazide (PRINZIDE,ZESTORETIC) 10-12.5 MG tablet TAKE 1 TABLET BY MOUTH EVERY DAY 90 tablet 0   LORazepam (ATIVAN) 0.5 MG tablet Take 1 tablet (0.5 mg total) by mouth at bedtime as needed for sleep. 30 tablet 0   ondansetron (ZOFRAN) 8 MG tablet Take 1 tablet (8 mg total) by mouth 2 (two) times daily as needed. Start on Heather third day after chemotherapy. ([name] not taking: Reported on 12/04/2018) 30 tablet 1   prochlorperazine (COMPAZINE) 10 MG tablet Take 1 tablet (10 mg total) by mouth every 6 (six) hours as needed (Nausea or vomiting). ([name] not taking: Reported on 12/04/2018) 30 tablet 1   Vitamin D, Ergocalciferol, (DRISDOL) 1.25 MG (50000 UT) CAPS capsule Take 1 capsule (50,000 Units total) by mouth every 7 (seven) days. Take for 12 total doses(weeks) than can transition to 1000 units OTC supplement daily 12 capsule 0   No current facility-administered medications for this visit.    Facility-Administered Medications Ordered in Other Visits  Medication Dose Route Frequency Provider Last Rate Last Dose   sodium chloride flush (NS) 0.9 % injection 10 mL  10 mL Intravenous PRN Nicholas Lose, MD   10 mL at 12/17/18 0907    PHYSICAL EXAMINATION: ECOG PERFORMANCE STATUS: 1 - Symptomatic but completely  ambulatory  Vitals:   02/18/19 1124  BP: 123/64  Pulse: (!) 115  Resp: 17  Temp: 98.7 F (37.1 C)  SpO2: 100%   Filed Weights   02/18/19 1124  Weight: 105 lb (47.6 kg)    GENERAL: alert, no distress and comfortable SKIN: skin color, texture, turgor are normal, no rashes or significant lesions EYES: normal, Conjunctiva are pink and non-injected, sclera clear OROPHARYNX: no exudate, no erythema and lips, buccal mucosa, and tongue normal  NECK: supple, thyroid normal size, non-tender, without nodularity LYMPH: no palpable lymphadenopathy in Heather cervical, axillary or inguinal LUNGS: clear to auscultation and percussion with normal breathing effort HEART: regular rate & rhythm and no murmurs and no lower extremity edema ABDOMEN: abdomen soft, non-tender and normal bowel sounds MUSCULOSKELETAL: no cyanosis of digits and no clubbing  NEURO: alert & oriented x 3 with fluent speech, no focal motor/sensory deficits EXTREMITIES: No lower extremity edema  LABORATORY DATA:  I have reviewed Heather data as listed CMP Latest Ref Rng & Units 02/11/2019 01/28/2019 01/14/2019  Glucose 70 - 99 mg/dL 120(H) 135(H) 118(H)  BUN 6 - 20 mg/dL 11 10 12   Creatinine 0.44 - 1.00 mg/dL 0.78 0.81 0.81  Sodium 135 - 145 mmol/L 141 143 141  Potassium 3.5 - 5.1 mmol/L 3.9 3.3(L) 3.7  Chloride 98 - 111 mmol/L 102 105 103  CO2 22 - 32 mmol/L 29 27 27   Calcium 8.9 - 10.3 mg/dL 9.6 8.7(L) 9.5  Total Protein 6.5 - 8.1 g/dL 6.5 6.0(L) 6.6  Total Bilirubin 0.3 - 1.2 mg/dL <0.2(L) <0.2(L) <0.2(L)  Alkaline Phos 38 - 126 U/L 113 115 116  AST 15 - 41 U/L 24 24 29   ALT 0 - 44 U/L 12 11 20     Lab Results  Component Value Date   WBC 8.6 02/18/2019   HGB 8.9 (L) 02/18/2019   HCT 28.2 (L) 02/18/2019   MCV 84.2 02/18/2019   PLT 435 (H) 02/18/2019   NEUTROABS 6.8 02/18/2019    ASSESSMENT & PLAN:  Malignant neoplasm of upper-outer quadrant of left breast in female, estrogen receptor positive ([facility]) 11/13/2018:Left  mastectomy: Grade 2 IDC, 1.6 cm, with intermediate grade DCIS, margins negative, lymphovascular invasion present, 1/7 lymph nodes positive, MammaPrint high risk, ER 100%, PR 30%, HER-2 negative, Ki-67 30% T1CN1A stage Ia  Treatment plan: 1.Adjuvant chemotherapy with dose since he remains on Cytoxan x4 followed by Taxol weekly x12 2.Followed by adjuvant radiation therapy 3.Followed by adjuvant antiestrogen therapy ------------------------------------------------------------------------------------------------------------------ Current Treatment: Completed 4 cycles of dose dense Adriamycin and Cytoxan.  Today is cycle 2 of Taxol  Labs are reviewed  Chemo toxicities: 1.Mild nausea lasts 2-3 days after chemo with loss of taste 2.Vaginal dryness 3. Chemo induced anemia: Monitoring closely. No change in treatment yet.Hemoglobin is down to 8.9 today.  Weight loss: [name] is trying to add Ensure to her diet.  She has poor appetite and lack of taste.  I discussed with her that appetite stimulants do carry side effects and I would like to see how she does during Taxol treatments before adding an appetite stimulant. She will wait another week before thinking about appetite stimulants. She is getting set up with a dietitian to discuss some of Heather options to gain weight. I discussed with her about labeling foods with citrus flavors. I also encouraged her to drink more water.  Return to clinicinweekly for Taxol and every other week for follow-up with me.  No orders of Heather defined types were placed in this encounter.  Heather [name] has a good understanding of Heather overall plan. she agrees with it. she will call with any problems that may develop before Heather next visit here.  Nicholas Lose, MD 02/18/2019  Julious Oka Dorshimer am acting as scribe for Dr. Nicholas Lose.  I have reviewed Heather above documentation for accuracy and completeness, and I agree with Heather above.

## 2019-02-18 ENCOUNTER — Inpatient Hospital Stay (HOSPITAL_BASED_OUTPATIENT_CLINIC_OR_DEPARTMENT_OTHER): Payer: BC Managed Care – PPO | Admitting: Hematology and Oncology

## 2019-02-18 ENCOUNTER — Inpatient Hospital Stay: Payer: BC Managed Care – PPO

## 2019-02-18 ENCOUNTER — Other Ambulatory Visit: Payer: Self-pay

## 2019-02-18 ENCOUNTER — Telehealth: Payer: Self-pay | Admitting: *Deleted

## 2019-02-18 VITALS — HR 96

## 2019-02-18 DIAGNOSIS — C50412 Malignant neoplasm of upper-outer quadrant of left female breast: Secondary | ICD-10-CM

## 2019-02-18 DIAGNOSIS — Z95828 Presence of other vascular implants and grafts: Secondary | ICD-10-CM

## 2019-02-18 DIAGNOSIS — Z170 Estrogen receptor positive status [ER+]: Secondary | ICD-10-CM

## 2019-02-18 DIAGNOSIS — R634 Abnormal weight loss: Secondary | ICD-10-CM

## 2019-02-18 DIAGNOSIS — D6481 Anemia due to antineoplastic chemotherapy: Secondary | ICD-10-CM | POA: Diagnosis not present

## 2019-02-18 DIAGNOSIS — Z9012 Acquired absence of left breast and nipple: Secondary | ICD-10-CM

## 2019-02-18 LAB — CMP (CANCER CENTER ONLY)
ALT: 11 U/L (ref 0–44)
AST: 23 U/L (ref 15–41)
Albumin: 3.6 g/dL (ref 3.5–5.0)
Alkaline Phosphatase: 77 U/L (ref 38–126)
Anion gap: 9 (ref 5–15)
BUN: 12 mg/dL (ref 6–20)
CO2: 29 mmol/L (ref 22–32)
Calcium: 9.6 mg/dL (ref 8.9–10.3)
Chloride: 100 mmol/L (ref 98–111)
Creatinine: 0.82 mg/dL (ref 0.44–1.00)
GFR, Est AFR Am: >60 mL/min (ref >60)
GFR, Estimated: >60 mL/min (ref >60)
Glucose, Bld: 163 mg/dL — ABNORMAL HIGH (ref 70–99)
Potassium: 3.8 mmol/L (ref 3.5–5.1)
Sodium: 138 mmol/L (ref 135–145)
Total Bilirubin: 0.2 mg/dL — ABNORMAL LOW (ref 0.3–1.2)
Total Protein: 6.4 g/dL — ABNORMAL LOW (ref 6.5–8.1)

## 2019-02-18 LAB — CBC WITH DIFFERENTIAL (CANCER CENTER ONLY)
Abs Immature Granulocytes: 0.08 10*3/uL — ABNORMAL HIGH (ref 0.00–0.07)
Basophils Absolute: 0.1 10*3/uL (ref 0.0–0.1)
Basophils Relative: 1 %
Eosinophils Absolute: 0 10*3/uL (ref 0.0–0.5)
Eosinophils Relative: 0 %
HCT: 28.2 % — ABNORMAL LOW (ref 36.0–46.0)
Hemoglobin: 8.9 g/dL — ABNORMAL LOW (ref 12.0–15.0)
Immature Granulocytes: 1 %
Lymphocytes Relative: 6 %
Lymphs Abs: 0.5 10*3/uL — ABNORMAL LOW (ref 0.7–4.0)
MCH: 26.6 pg (ref 26.0–34.0)
MCHC: 31.6 g/dL (ref 30.0–36.0)
MCV: 84.2 fL (ref 80.0–100.0)
Monocytes Absolute: 1.2 10*3/uL — ABNORMAL HIGH (ref 0.1–1.0)
Monocytes Relative: 13 %
Neutro Abs: 6.8 10*3/uL (ref 1.7–7.7)
Neutrophils Relative %: 79 %
Platelet Count: 435 10*3/uL — ABNORMAL HIGH (ref 150–400)
RBC: 3.35 MIL/uL — ABNORMAL LOW (ref 3.87–5.11)
RDW: 17.9 % — ABNORMAL HIGH (ref 11.5–15.5)
WBC Count: 8.6 10*3/uL (ref 4.0–10.5)
nRBC: 0 % (ref 0.0–0.2)

## 2019-02-18 MED ORDER — HEPARIN SOD (PORK) LOCK FLUSH 100 UNIT/ML IV SOLN
500.0000 [IU] | Freq: Once | INTRAVENOUS | Status: AC | PRN
  Administered 2019-02-18: 16:00:00 500 [IU]
  Filled 2019-02-18: qty 5

## 2019-02-18 MED ORDER — SODIUM CHLORIDE 0.9% FLUSH
10.0000 mL | INTRAVENOUS | Status: DC | PRN
  Administered 2019-02-18: 10 mL
  Filled 2019-02-18: qty 10

## 2019-02-18 MED ORDER — DIPHENHYDRAMINE HCL 50 MG/ML IJ SOLN
50.0000 mg | Freq: Once | INTRAMUSCULAR | Status: AC
  Administered 2019-02-18: 13:00:00 50 mg via INTRAVENOUS

## 2019-02-18 MED ORDER — SODIUM CHLORIDE 0.9 % IV SOLN
20.0000 mg | Freq: Once | INTRAVENOUS | Status: AC
  Administered 2019-02-18: 14:00:00 20 mg via INTRAVENOUS
  Filled 2019-02-18: qty 20

## 2019-02-18 MED ORDER — FAMOTIDINE IN NACL 20-0.9 MG/50ML-% IV SOLN
INTRAVENOUS | Status: AC
  Filled 2019-02-18: qty 50

## 2019-02-18 MED ORDER — DIPHENHYDRAMINE HCL 50 MG/ML IJ SOLN
INTRAMUSCULAR | Status: AC
  Filled 2019-02-18: qty 1

## 2019-02-18 MED ORDER — FAMOTIDINE IN NACL 20-0.9 MG/50ML-% IV SOLN
20.0000 mg | Freq: Once | INTRAVENOUS | Status: AC
  Administered 2019-02-18: 13:00:00 20 mg via INTRAVENOUS

## 2019-02-18 MED ORDER — SODIUM CHLORIDE 0.9% FLUSH
10.0000 mL | INTRAVENOUS | Status: DC | PRN
  Administered 2019-02-18: 16:00:00 10 mL
  Filled 2019-02-18: qty 10

## 2019-02-18 MED ORDER — SODIUM CHLORIDE 0.9 % IV SOLN
80.0000 mg/m2 | Freq: Once | INTRAVENOUS | Status: AC
  Administered 2019-02-18: 15:00:00 120 mg via INTRAVENOUS
  Filled 2019-02-18: qty 20

## 2019-02-18 MED ORDER — SODIUM CHLORIDE 0.9 % IV SOLN
Freq: Once | INTRAVENOUS | Status: AC
  Administered 2019-02-18: 13:00:00 via INTRAVENOUS
  Filled 2019-02-18: qty 250

## 2019-02-18 NOTE — Telephone Encounter (Signed)
Spoke with patient to follow up.  She states she is really having trouble with eating.  Nothing has any taste.  She states she struggles with fluids as well.  Informed her I could refer her to our dietician and she agrees.  I will place a referral for that.  She also states she will discuss with Dr. Lindi Adie at today's visit.

## 2019-02-18 NOTE — Patient Instructions (Signed)
[facility] Discharge Instructions for Patients Receiving Chemotherapy  Today you received the following chemotherapy agents: Taxol   To help prevent nausea and vomiting after your treatment, we encourage you to take your nausea medication as directed.    If you develop nausea and vomiting that is not controlled by your nausea medication, call the clinic.   BELOW ARE SYMPTOMS THAT SHOULD BE REPORTED IMMEDIATELY:  *FEVER GREATER THAN 100.5 F  *CHILLS WITH OR WITHOUT FEVER  NAUSEA AND VOMITING THAT IS NOT CONTROLLED WITH YOUR NAUSEA MEDICATION  *UNUSUAL SHORTNESS OF BREATH  *UNUSUAL BRUISING OR BLEEDING  TENDERNESS IN MOUTH AND THROAT WITH OR WITHOUT PRESENCE OF ULCERS  *URINARY PROBLEMS  *BOWEL PROBLEMS  UNUSUAL RASH Items with * indicate a potential emergency and should be followed up as soon as possible.  Feel free to call the clinic should you have any questions or concerns. The clinic phone number is [phone].  Please show the [place] at check-in to the Emergency Department and triage nurse.  Paclitaxel injection What is this medicine? PACLITAXEL (PAK li TAX el) is a chemotherapy drug. It targets fast dividing cells, like cancer cells, and causes these cells to die. This medicine is used to treat ovarian cancer, breast cancer, lung cancer, Kaposi's sarcoma, and other cancers. This medicine may be used for other purposes; ask your health care provider or pharmacist if you have questions. COMMON BRAND NAME(S): Onxol, Taxol What should I tell my health care provider before I take this medicine? They need to know if you have any of these conditions: -history of irregular heartbeat -liver disease -low blood counts, like low white cell, platelet, or red cell counts -lung or breathing disease, like asthma -tingling of the fingers or toes, or other nerve disorder -an unusual or allergic reaction to paclitaxel, alcohol, polyoxyethylated castor oil,  other chemotherapy, other medicines, foods, dyes, or preservatives -pregnant or trying to get pregnant -breast-feeding How should I use this medicine? This drug is given as an infusion into a vein. It is administered in a hospital or clinic by a specially trained health care professional. Talk to your pediatrician regarding the use of this medicine in children. Special care may be needed. Overdosage: If you think you have taken too much of this medicine contact a poison control center or emergency room at once. NOTE: This medicine is only for you. Do not share this medicine with others. What if I miss a dose? It is important not to miss your dose. Call your doctor or health care professional if you are unable to keep an appointment. What may interact with this medicine? Do not take this medicine with any of the following medications: -disulfiram -metronidazole This medicine may also interact with the following medications: -antiviral medicines for hepatitis, HIV or AIDS -certain antibiotics like erythromycin and clarithromycin -certain medicines for fungal infections like ketoconazole and itraconazole -certain medicines for seizures like carbamazepine, phenobarbital, phenytoin -gemfibrozil -nefazodone -rifampin -St. John's wort This list may not describe all possible interactions. Give your health care provider a list of all the medicines, herbs, non-prescription drugs, or dietary supplements you use. Also tell them if you smoke, drink alcohol, or use illegal drugs. Some items may interact with your medicine. What should I watch for while using this medicine? Your condition will be monitored carefully while you are receiving this medicine. You will need important blood work done while you are taking this medicine. This medicine can cause serious allergic reactions. To reduce  your risk you will need to take other medicine(s) before treatment with this medicine. If you experience allergic  reactions like skin rash, itching or hives, swelling of the face, lips, or tongue, tell your doctor or health care professional right away. In some cases, you may be given additional medicines to help with side effects. Follow all directions for their use. This drug may make you feel generally unwell. This is not uncommon, as chemotherapy can affect healthy cells as well as cancer cells. Report any side effects. Continue your course of treatment even though you feel ill unless your doctor tells you to stop. Call your doctor or health care professional for advice if you get a fever, chills or sore throat, or other symptoms of a cold or flu. Do not treat yourself. This drug decreases your body's ability to fight infections. Try to avoid being around people who are sick. This medicine may increase your risk to bruise or bleed. Call your doctor or health care professional if you notice any unusual bleeding. Be careful brushing and flossing your teeth or using a toothpick because you may get an infection or bleed more easily. If you have any dental work done, tell your dentist you are receiving this medicine. Avoid taking products that contain aspirin, acetaminophen, ibuprofen, naproxen, or ketoprofen unless instructed by your doctor. These medicines may hide a fever. Do not become pregnant while taking this medicine. Women should inform their doctor if they wish to become pregnant or think they might be pregnant. There is a potential for serious side effects to an unborn child. Talk to your health care professional or pharmacist for more information. Do not breast-feed an infant while taking this medicine. Men are advised not to father a child while receiving this medicine. This product may contain alcohol. Ask your pharmacist or healthcare provider if this medicine contains alcohol. Be sure to tell all healthcare providers you are taking this medicine. Certain medicines, like metronidazole and disulfiram, can  cause an unpleasant reaction when taken with alcohol. The reaction includes flushing, headache, nausea, vomiting, sweating, and increased thirst. The reaction can last from 30 minutes to several hours. What side effects may I notice from receiving this medicine? Side effects that you should report to your doctor or health care professional as soon as possible: -allergic reactions like skin rash, itching or hives, swelling of the face, lips, or tongue -breathing problems -changes in vision -fast, irregular heartbeat -high or low blood pressure -mouth sores -pain, tingling, numbness in the hands or feet -signs of decreased platelets or bleeding - bruising, pinpoint red spots on the skin, black, tarry stools, blood in the urine -signs of decreased red blood cells - unusually weak or tired, feeling faint or lightheaded, falls -signs of infection - fever or chills, cough, sore throat, pain or difficulty passing urine -signs and symptoms of liver injury like dark yellow or brown urine; general ill feeling or flu-like symptoms; light-colored stools; loss of appetite; nausea; right upper belly pain; unusually weak or tired; yellowing of the eyes or skin -swelling of the ankles, feet, hands -unusually slow heartbeat Side effects that usually do not require medical attention (report to your doctor or health care professional if they continue or are bothersome): -diarrhea -hair loss -loss of appetite -muscle or joint pain -nausea, vomiting -pain, redness, or irritation at site where injected -tiredness This list may not describe all possible side effects. Call your doctor for medical advice about side effects. You may report side effects  to FDA at 1-800-FDA-1088. Where should I keep my medicine? This drug is given in a hospital or clinic and will not be stored at home. NOTE: This sheet is a summary. It may not cover all possible information. If you have questions about this medicine, talk to your  doctor, pharmacist, or health care provider.  2019 Elsevier/Gold Standard (2017-05-23 13:14:55)

## 2019-02-20 ENCOUNTER — Telehealth: Payer: Self-pay | Admitting: Hematology and Oncology

## 2019-02-20 NOTE — Telephone Encounter (Signed)
Called regarding 6/2

## 2019-02-23 ENCOUNTER — Other Ambulatory Visit: Payer: Self-pay | Admitting: Osteopathic Medicine

## 2019-02-23 NOTE — Telephone Encounter (Signed)
Please review for refill- patient at [facility] 

## 2019-02-26 ENCOUNTER — Inpatient Hospital Stay: Payer: BC Managed Care – PPO

## 2019-02-26 ENCOUNTER — Other Ambulatory Visit: Payer: Self-pay

## 2019-02-26 VITALS — BP 100/63 | HR 90 | Temp 98.3°F | Resp 16

## 2019-02-26 DIAGNOSIS — C50412 Malignant neoplasm of upper-outer quadrant of left female breast: Secondary | ICD-10-CM

## 2019-02-26 DIAGNOSIS — Z170 Estrogen receptor positive status [ER+]: Secondary | ICD-10-CM

## 2019-02-26 LAB — CBC WITH DIFFERENTIAL (CANCER CENTER ONLY)
Abs Immature Granulocytes: 0.03 10*3/uL (ref 0.00–0.07)
Basophils Absolute: 0 10*3/uL (ref 0.0–0.1)
Basophils Relative: 1 %
Eosinophils Absolute: 0 10*3/uL (ref 0.0–0.5)
Eosinophils Relative: 1 %
HCT: 27.9 % — ABNORMAL LOW (ref 36.0–46.0)
Hemoglobin: 8.6 g/dL — ABNORMAL LOW (ref 12.0–15.0)
Immature Granulocytes: 1 %
Lymphocytes Relative: 8 %
Lymphs Abs: 0.5 10*3/uL — ABNORMAL LOW (ref 0.7–4.0)
MCH: 26.8 pg (ref 26.0–34.0)
MCHC: 30.8 g/dL (ref 30.0–36.0)
MCV: 86.9 fL (ref 80.0–100.0)
Monocytes Absolute: 1 10*3/uL (ref 0.1–1.0)
Monocytes Relative: 15 %
Neutro Abs: 5.1 10*3/uL (ref 1.7–7.7)
Neutrophils Relative %: 74 %
Platelet Count: 314 10*3/uL (ref 150–400)
RBC: 3.21 MIL/uL — ABNORMAL LOW (ref 3.87–5.11)
RDW: 17.5 % — ABNORMAL HIGH (ref 11.5–15.5)
WBC Count: 6.6 10*3/uL (ref 4.0–10.5)
nRBC: 0 % (ref 0.0–0.2)

## 2019-02-26 LAB — CMP (CANCER CENTER ONLY)
ALT: 9 U/L (ref 0–44)
AST: 23 U/L (ref 15–41)
Albumin: 3.4 g/dL — ABNORMAL LOW (ref 3.5–5.0)
Alkaline Phosphatase: 69 U/L (ref 38–126)
Anion gap: 8 (ref 5–15)
BUN: 12 mg/dL (ref 6–20)
CO2: 29 mmol/L (ref 22–32)
Calcium: 9.3 mg/dL (ref 8.9–10.3)
Chloride: 100 mmol/L (ref 98–111)
Creatinine: 0.72 mg/dL (ref 0.44–1.00)
GFR, Est AFR Am: >60 mL/min (ref >60)
GFR, Estimated: >60 mL/min (ref >60)
Glucose, Bld: 122 mg/dL — ABNORMAL HIGH (ref 70–99)
Potassium: 3.5 mmol/L (ref 3.5–5.1)
Sodium: 137 mmol/L (ref 135–145)
Total Bilirubin: 0.2 mg/dL — ABNORMAL LOW (ref 0.3–1.2)
Total Protein: 6.5 g/dL (ref 6.5–8.1)

## 2019-02-26 MED ORDER — HEPARIN SOD (PORK) LOCK FLUSH 100 UNIT/ML IV SOLN
500.0000 [IU] | Freq: Once | INTRAVENOUS | Status: AC | PRN
  Administered 2019-02-26: 15:00:00 500 [IU]
  Filled 2019-02-26: qty 5

## 2019-02-26 MED ORDER — FAMOTIDINE IN NACL 20-0.9 MG/50ML-% IV SOLN
20.0000 mg | Freq: Once | INTRAVENOUS | Status: AC
  Administered 2019-02-26: 13:00:00 20 mg via INTRAVENOUS

## 2019-02-26 MED ORDER — DIPHENHYDRAMINE HCL 50 MG/ML IJ SOLN
INTRAMUSCULAR | Status: AC
  Filled 2019-02-26: qty 1

## 2019-02-26 MED ORDER — SODIUM CHLORIDE 0.9 % IV SOLN
20.0000 mg | Freq: Once | INTRAVENOUS | Status: AC
  Administered 2019-02-26: 14:00:00 20 mg via INTRAVENOUS
  Filled 2019-02-26: qty 20

## 2019-02-26 MED ORDER — SODIUM CHLORIDE 0.9 % IV SOLN
Freq: Once | INTRAVENOUS | Status: AC
  Administered 2019-02-26: 13:00:00 via INTRAVENOUS
  Filled 2019-02-26: qty 250

## 2019-02-26 MED ORDER — SODIUM CHLORIDE 0.9 % IV SOLN
80.0000 mg/m2 | Freq: Once | INTRAVENOUS | Status: AC
  Administered 2019-02-26: 14:00:00 120 mg via INTRAVENOUS
  Filled 2019-02-26: qty 20

## 2019-02-26 MED ORDER — FAMOTIDINE IN NACL 20-0.9 MG/50ML-% IV SOLN
INTRAVENOUS | Status: AC
  Filled 2019-02-26: qty 50

## 2019-02-26 MED ORDER — DIPHENHYDRAMINE HCL 50 MG/ML IJ SOLN
50.0000 mg | Freq: Once | INTRAMUSCULAR | Status: AC
  Administered 2019-02-26: 13:00:00 50 mg via INTRAVENOUS

## 2019-02-26 MED ORDER — SODIUM CHLORIDE 0.9% FLUSH
10.0000 mL | INTRAVENOUS | Status: DC | PRN
  Administered 2019-02-26: 15:00:00 10 mL
  Filled 2019-02-26: qty 10

## 2019-02-26 NOTE — Patient Instructions (Signed)
[facility] Discharge Instructions for Patients Receiving Chemotherapy  Today you received the following chemotherapy agents:  Taxol.  To help prevent nausea and vomiting after your treatment, we encourage you to take your nausea medication as directed.   If you develop nausea and vomiting that is not controlled by your nausea medication, call the clinic.   BELOW ARE SYMPTOMS THAT SHOULD BE REPORTED IMMEDIATELY:  *FEVER GREATER THAN 100.5 F  *CHILLS WITH OR WITHOUT FEVER  NAUSEA AND VOMITING THAT IS NOT CONTROLLED WITH YOUR NAUSEA MEDICATION  *UNUSUAL SHORTNESS OF BREATH  *UNUSUAL BRUISING OR BLEEDING  TENDERNESS IN MOUTH AND THROAT WITH OR WITHOUT PRESENCE OF ULCERS  *URINARY PROBLEMS  *BOWEL PROBLEMS  UNUSUAL RASH Items with * indicate a potential emergency and should be followed up as soon as possible.  Feel free to call the clinic should you have any questions or concerns. The clinic phone number is [phone].  Please show the CHEMO ALERT CARD at check-in to the Emergency Department and triage nurse.   

## 2019-02-26 NOTE — Assessment & Plan Note (Signed)
11/13/2018:Left mastectomy: Grade 2 IDC, 1.6 cm, with intermediate grade DCIS, margins negative, lymphovascular invasion present, 1/7 lymph nodes positive, MammaPrint high risk, ER 100%, PR 30%, HER-2 negative, Ki-67 30% T1CN1A stage Ia  Treatment plan: 1.Adjuvant chemotherapy with dose since he remains on Cytoxan x4 followed by Taxol weekly x12 2.Followed by adjuvant radiation therapy 3.Followed by adjuvant antiestrogen therapy ------------------------------------------------------------------------------------------------------------------ Current Treatment:Completed 4 cycles of dose dense Adriamycin and Cytoxan. Today is cycle 4 of Taxol  Labs are reviewed  Chemo toxicities: 1.Mild nausea lasts 2-3 days after chemo with loss of taste 2.Vaginal dryness 3. Chemo induced anemia: Monitoring closely. No change in treatment yet.Hemoglobin is down to 8.9today.  Weight loss: Patient is trying to add Ensure to her diet. She has poor appetite and lack of taste.  Consulted dietitian  Return to clinicinweekly for Taxol and every other week for follow-up with me.

## 2019-03-03 NOTE — Progress Notes (Signed)
Patient Care Team: Emeterio Reeve, DO as PCP - General (Osteopathic Medicine) Rolm Bookbinder, MD as Consulting Physician (General Surgery) Nicholas Lose, MD as Consulting Physician (Hematology and Oncology) Eppie Gibson, MD as Attending Physician (Radiation Oncology)  DIAGNOSIS:    ICD-10-CM   1. Malignant neoplasm of upper-outer quadrant of left breast in female, estrogen receptor positive ([place]) C50.412    Z17.0     SUMMARY OF ONCOLOGIC HISTORY:   Malignant neoplasm of upper-outer quadrant of left breast in female, estrogen receptor positive ([place])   09/12/2018 Initial Diagnosis    Palpable left breast mass at 2:30 position by ultrasound measured 1.6 cm, calcifications spanning 5.9 cm, 3 abnormal lymph nodes, multiple masses and calcifications biopsy of the mass revealed grade 2 IDC plus DCIS with lymphovascular invasion, ER 100%, PR 60%, Ki-67 30%, HER-2 negative IHC 1+, pathology of calcifications is pending, biopsy of lymph nodes is discordant benign, T1cN0 stage Ia    09/19/2018 Cancer Staging    Staging form: Breast, AJCC 8th Edition - Clinical stage from 09/19/2018: Stage IA (cT1c(m), cN0, cM0, G2, ER+, PR+, HER2-) - Signed by Nicholas Lose, MD on 09/19/2018    11/13/2018 Surgery    Left mastectomy: Grade 2 IDC, 1.6 cm, with intermediate grade DCIS, margins negative, lymphovascular invasion present, 1/7 lymph nodes positive, MammaPrint high risk, ER 100%, PR 30%, HER-2 negative, Ki-67 30% T1CN1A stage Ia    11/13/2018 Cancer Staging    Staging form: Breast, AJCC 8th Edition - Pathologic stage from 11/13/2018: Stage IA (pT1c, pN1a, cM0, G2, ER+, PR+, HER2-) - Signed by Gardenia Phlegm, NP on 11/28/2018    12/17/2018 -  Chemotherapy    The patient had DOXOrubicin (ADRIAMYCIN) chemo injection 92 mg, 60 mg/m2 = 92 mg, Intravenous,  Once, 4 of 4 cycles Administration: 92 mg (12/17/2018), 92 mg (12/31/2018), 92 mg (01/14/2019), 92 mg (01/28/2019) palonosetron (ALOXI)  injection 0.25 mg, 0.25 mg, Intravenous,  Once, 4 of 4 cycles Administration: 0.25 mg (12/17/2018), 0.25 mg (12/31/2018), 0.25 mg (01/14/2019), 0.25 mg (01/28/2019) pegfilgrastim (NEULASTA) injection 6 mg, 6 mg, Subcutaneous, Once, 2 of 2 cycles Administration: 6 mg (01/16/2019), 6 mg (01/30/2019) pegfilgrastim (NEULASTA ONPRO KIT) injection 6 mg, 6 mg, Subcutaneous, Once, 1 of 1 cycle pegfilgrastim-cbqv (UDENYCA) injection 6 mg, 6 mg, Subcutaneous, Once, 2 of 2 cycles Administration: 6 mg (12/19/2018), 6 mg (01/02/2019) cyclophosphamide (CYTOXAN) 920 mg in sodium chloride 0.9 % 250 mL chemo infusion, 600 mg/m2 = 920 mg, Intravenous,  Once, 4 of 4 cycles Administration: 920 mg (12/17/2018), 920 mg (12/31/2018), 920 mg (01/14/2019), 920 mg (01/28/2019) PACLitaxel (TAXOL) 120 mg in sodium chloride 0.9 % 250 mL chemo infusion (</= 74m/m2), 80 mg/m2 = 120 mg, Intravenous,  Once, 3 of 12 cycles Administration: 120 mg (02/11/2019), 120 mg (02/18/2019), 120 mg (02/26/2019) fosaprepitant (EMEND) 150 mg, dexamethasone (DECADRON) 12 mg in sodium chloride 0.9 % 145 mL IVPB, , Intravenous,  Once, 4 of 4 cycles Administration:  (12/17/2018),  (12/31/2018),  (01/14/2019),  (01/28/2019)  for chemotherapy treatment.      CHIEF COMPLIANT: Cycle 4 Taxol  INTERVAL HISTORY: Heather [name] a 58y.o. with above-mentioned history of left breast cancer treated with mastectomy whocompleted 4 cycles ofadjuvant chemotherapy with dose dense Adriamycin and Cytoxan and is currently receiving weekly Taxol.She presents to the clinicalone today for cycle4.  She continues to struggle with weight loss.  She is planning to meet the dietitian. Denies any nausea or vomiting.  Denies neuropathy. Complains of fatigue. Her mother had  a fracture and that she is going to take off next week to help her mom.  REVIEW OF SYSTEMS:   Constitutional: Denies fevers, chills   complains of fatigue and weight loss Eyes: Denies blurriness of vision  Ears, nose, mouth, throat, and face: Denies mucositis or sore throat Respiratory: Denies cough, dyspnea or wheezes Cardiovascular: Denies palpitation, chest discomfort Gastrointestinal: Denies nausea, heartburn or change in bowel habits Skin: Denies abnormal skin rashes Lymphatics: Denies new lymphadenopathy or easy bruising Neurological: Denies numbness, tingling or new weaknesses Behavioral/Psych: Mood is stable, no new changes  Extremities: No lower extremity edema Breast: denies any pain or lumps or nodules in either breasts All other systems were reviewed with the patient and are negative.  I have reviewed the past medical history, past surgical history, social history and family history with the patient and they are unchanged from previous note.  ALLERGIES:  has No Known Allergies.  MEDICATIONS:  Current Outpatient Medications  Medication Sig Dispense Refill  . lidocaine-prilocaine (EMLA) cream Apply 1 application topically as needed. 30 g 0  . lisinopril-hydrochlorothiazide (PRINZIDE,ZESTORETIC) 10-12.5 MG tablet TAKE 1 TABLET BY MOUTH EVERY DAY 90 tablet 0  . LORazepam (ATIVAN) 0.5 MG tablet Take 1 tablet (0.5 mg total) by mouth at bedtime as needed for sleep. 30 tablet 0  . ondansetron (ZOFRAN) 8 MG tablet Take 1 tablet (8 mg total) by mouth 2 (two) times daily as needed. Start on the third day after chemotherapy. (Patient not taking: Reported on 12/04/2018) 30 tablet 1  . prochlorperazine (COMPAZINE) 10 MG tablet Take 1 tablet (10 mg total) by mouth every 6 (six) hours as needed (Nausea or vomiting). (Patient not taking: Reported on 12/04/2018) 30 tablet 1  . Vitamin D, Ergocalciferol, (DRISDOL) 1.25 MG (50000 UT) CAPS capsule Take 1 capsule (50,000 Units total) by mouth every 7 (seven) days. Take for 12 total doses(weeks) than can transition to 1000 units OTC supplement daily 12 capsule 0   No current facility-administered medications for this visit.    Facility-Administered  Medications Ordered in Other Visits  Medication Dose Route Frequency Provider Last Rate Last Dose  . sodium chloride flush (NS) 0.9 % injection 10 mL  10 mL Intravenous PRN Nicholas Lose, MD   10 mL at 12/17/18 0907    PHYSICAL EXAMINATION: ECOG PERFORMANCE STATUS: 1 - Symptomatic but completely ambulatory  Vitals:   03/04/19 1109  BP: 132/70  Pulse: (!) 109  Resp: 18  Temp: 98.6 F (37 C)  SpO2: 100%   Filed Weights   03/04/19 1109  Weight: 102 lb 1.6 oz (46.3 kg)    GENERAL: alert, no distress and comfortable SKIN: skin color, texture, turgor are normal, no rashes or significant lesions EYES: normal, Conjunctiva are pink and non-injected, sclera clear OROPHARYNX: no exudate, no erythema and lips, buccal mucosa, and tongue normal  NECK: supple, thyroid normal size, non-tender, without nodularity LYMPH: no palpable lymphadenopathy in the cervical, axillary or inguinal LUNGS: clear to auscultation and percussion with normal breathing effort HEART: regular rate & rhythm and no murmurs and no lower extremity edema ABDOMEN: abdomen soft, non-tender and normal bowel sounds MUSCULOSKELETAL: no cyanosis of digits and no clubbing  NEURO: alert & oriented x 3 with fluent speech, no focal motor/sensory deficits EXTREMITIES: No lower extremity edema  LABORATORY DATA:  I have reviewed the data as listed CMP Latest Ref Rng & Units 02/26/2019 02/18/2019 02/11/2019  Glucose 70 - 99 mg/dL 122(H) 163(H) 120(H)  BUN 6 - 20 mg/dL 12  12 11  Creatinine 0.44 - 1.00 mg/dL 0.72 0.82 0.78  Sodium 135 - 145 mmol/L 137 138 141  Potassium 3.5 - 5.1 mmol/L 3.5 3.8 3.9  Chloride 98 - 111 mmol/L 100 100 102  CO2 22 - 32 mmol/L 29 29 29   Calcium 8.9 - 10.3 mg/dL 9.3 9.6 9.6  Total Protein 6.5 - 8.1 g/dL 6.5 6.4(L) 6.5  Total Bilirubin 0.3 - 1.2 mg/dL 0.2(L) 0.2(L) <0.2(L)  Alkaline Phos 38 - 126 U/L 69 77 113  AST 15 - 41 U/L 23 23 24   ALT 0 - 44 U/L 9 11 12     Lab Results  Component Value Date    WBC 7.0 03/04/2019   HGB 8.7 (L) 03/04/2019   HCT 27.9 (L) 03/04/2019   MCV 85.1 03/04/2019   PLT 377 03/04/2019   NEUTROABS 5.8 03/04/2019    ASSESSMENT & PLAN:  Malignant neoplasm of upper-outer quadrant of left breast in female, estrogen receptor positive ([place]) 11/13/2018:Left mastectomy: Grade 2 IDC, 1.6 cm, with intermediate grade DCIS, margins negative, lymphovascular invasion present, 1/7 lymph nodes positive, MammaPrint high risk, ER 100%, PR 30%, HER-2 negative, Ki-67 30% T1CN1A stage Ia  Treatment plan: 1.Adjuvant chemotherapy with dose since he remains on Cytoxan x4 followed by Taxol weekly x12 2.Followed by adjuvant radiation therapy 3.Followed by adjuvant antiestrogen therapy ------------------------------------------------------------------------------------------------------------------ Current Treatment:Completed 4 cycles of dose dense Adriamycin and Cytoxan. Today is cycle 4 of Taxol  Labs are reviewed  Chemo toxicities: 1.Mild nausea lasts 2-3 days after chemo with loss of taste 2.Vaginal dryness 3. Chemo induced anemia: Monitoring closely. No change in treatment yet.Hemoglobin is down to 8.7today.  Weight loss: Patient is trying to add Ensure to her diet. She has poor appetite and lack of taste.  Consulted dietitian Patient is gone next week to help take care of her mother who had a fracture. Because of this we will cancel next week's treatment and she will continue with Taxol the following Monday.  Return to clinicinweekly for Taxol and every other week for follow-up with me.  No orders of the defined types were placed in this encounter.  The patient has a good understanding of the overall plan. she agrees with it. she will call with any problems that may develop before the next visit here.  Nicholas Lose, MD 03/04/2019  Julious Oka Dorshimer am acting as scribe for Dr. Nicholas Lose.  I have reviewed the above documentation for accuracy and  completeness, and I agree with the above.

## 2019-03-04 ENCOUNTER — Inpatient Hospital Stay (HOSPITAL_BASED_OUTPATIENT_CLINIC_OR_DEPARTMENT_OTHER): Payer: BC Managed Care – PPO | Admitting: Hematology and Oncology

## 2019-03-04 ENCOUNTER — Other Ambulatory Visit: Payer: Self-pay

## 2019-03-04 ENCOUNTER — Inpatient Hospital Stay: Payer: BC Managed Care – PPO | Attending: Genetic Counselor

## 2019-03-04 ENCOUNTER — Inpatient Hospital Stay: Payer: BC Managed Care – PPO

## 2019-03-04 VITALS — HR 98

## 2019-03-04 DIAGNOSIS — C50412 Malignant neoplasm of upper-outer quadrant of left female breast: Secondary | ICD-10-CM | POA: Insufficient documentation

## 2019-03-04 DIAGNOSIS — D6481 Anemia due to antineoplastic chemotherapy: Secondary | ICD-10-CM

## 2019-03-04 DIAGNOSIS — Z5111 Encounter for antineoplastic chemotherapy: Secondary | ICD-10-CM | POA: Diagnosis present

## 2019-03-04 DIAGNOSIS — R5383 Other fatigue: Secondary | ICD-10-CM

## 2019-03-04 DIAGNOSIS — Z170 Estrogen receptor positive status [ER+]: Secondary | ICD-10-CM

## 2019-03-04 DIAGNOSIS — Z79899 Other long term (current) drug therapy: Secondary | ICD-10-CM

## 2019-03-04 DIAGNOSIS — Z9221 Personal history of antineoplastic chemotherapy: Secondary | ICD-10-CM

## 2019-03-04 DIAGNOSIS — Z95828 Presence of other vascular implants and grafts: Secondary | ICD-10-CM

## 2019-03-04 DIAGNOSIS — R634 Abnormal weight loss: Secondary | ICD-10-CM | POA: Insufficient documentation

## 2019-03-04 LAB — CBC WITH DIFFERENTIAL (CANCER CENTER ONLY)
Abs Immature Granulocytes: 0.06 10*3/uL (ref 0.00–0.07)
Basophils Absolute: 0 10*3/uL (ref 0.0–0.1)
Basophils Relative: 1 %
Eosinophils Absolute: 0 10*3/uL (ref 0.0–0.5)
Eosinophils Relative: 0 %
HCT: 27.9 % — ABNORMAL LOW (ref 36.0–46.0)
Hemoglobin: 8.7 g/dL — ABNORMAL LOW (ref 12.0–15.0)
Immature Granulocytes: 1 %
Lymphocytes Relative: 6 %
Lymphs Abs: 0.4 10*3/uL — ABNORMAL LOW (ref 0.7–4.0)
MCH: 26.5 pg (ref 26.0–34.0)
MCHC: 31.2 g/dL (ref 30.0–36.0)
MCV: 85.1 fL (ref 80.0–100.0)
Monocytes Absolute: 0.6 10*3/uL (ref 0.1–1.0)
Monocytes Relative: 9 %
Neutro Abs: 5.8 10*3/uL (ref 1.7–7.7)
Neutrophils Relative %: 83 %
Platelet Count: 377 10*3/uL (ref 150–400)
RBC: 3.28 MIL/uL — ABNORMAL LOW (ref 3.87–5.11)
RDW: 17 % — ABNORMAL HIGH (ref 11.5–15.5)
WBC Count: 7 10*3/uL (ref 4.0–10.5)
nRBC: 0 % (ref 0.0–0.2)

## 2019-03-04 LAB — CMP (CANCER CENTER ONLY)
ALT: 9 U/L (ref 0–44)
AST: 22 U/L (ref 15–41)
Albumin: 3.3 g/dL — ABNORMAL LOW (ref 3.5–5.0)
Alkaline Phosphatase: 62 U/L (ref 38–126)
Anion gap: 9 (ref 5–15)
BUN: 12 mg/dL (ref 6–20)
CO2: 28 mmol/L (ref 22–32)
Calcium: 9.8 mg/dL (ref 8.9–10.3)
Chloride: 100 mmol/L (ref 98–111)
Creatinine: 0.82 mg/dL (ref 0.44–1.00)
GFR, Est AFR Am: >60 mL/min (ref >60)
GFR, Estimated: >60 mL/min (ref >60)
Glucose, Bld: 181 mg/dL — ABNORMAL HIGH (ref 70–99)
Potassium: 3.5 mmol/L (ref 3.5–5.1)
Sodium: 137 mmol/L (ref 135–145)
Total Bilirubin: 0.4 mg/dL (ref 0.3–1.2)
Total Protein: 6.6 g/dL (ref 6.5–8.1)

## 2019-03-04 MED ORDER — FAMOTIDINE IN NACL 20-0.9 MG/50ML-% IV SOLN
20.0000 mg | Freq: Once | INTRAVENOUS | Status: AC
  Administered 2019-03-04: 20 mg via INTRAVENOUS

## 2019-03-04 MED ORDER — DIPHENHYDRAMINE HCL 50 MG/ML IJ SOLN
INTRAMUSCULAR | Status: AC
  Filled 2019-03-04: qty 1

## 2019-03-04 MED ORDER — FAMOTIDINE IN NACL 20-0.9 MG/50ML-% IV SOLN
INTRAVENOUS | Status: AC
  Filled 2019-03-04: qty 50

## 2019-03-04 MED ORDER — DIPHENHYDRAMINE HCL 50 MG/ML IJ SOLN
50.0000 mg | Freq: Once | INTRAMUSCULAR | Status: AC
  Administered 2019-03-04: 13:00:00 50 mg via INTRAVENOUS

## 2019-03-04 MED ORDER — HEPARIN SOD (PORK) LOCK FLUSH 100 UNIT/ML IV SOLN
500.0000 [IU] | Freq: Once | INTRAVENOUS | Status: AC | PRN
  Administered 2019-03-04: 500 [IU]
  Filled 2019-03-04: qty 5

## 2019-03-04 MED ORDER — SODIUM CHLORIDE 0.9 % IV SOLN
Freq: Once | INTRAVENOUS | Status: AC
  Administered 2019-03-04: 13:00:00 via INTRAVENOUS
  Filled 2019-03-04: qty 250

## 2019-03-04 MED ORDER — SODIUM CHLORIDE 0.9% FLUSH
10.0000 mL | INTRAVENOUS | Status: DC | PRN
  Administered 2019-03-04: 10 mL
  Filled 2019-03-04: qty 10

## 2019-03-04 MED ORDER — SODIUM CHLORIDE 0.9 % IV SOLN
20.0000 mg | Freq: Once | INTRAVENOUS | Status: AC
  Administered 2019-03-04: 13:00:00 20 mg via INTRAVENOUS
  Filled 2019-03-04: qty 2

## 2019-03-04 MED ORDER — SODIUM CHLORIDE 0.9 % IV SOLN
80.0000 mg/m2 | Freq: Once | INTRAVENOUS | Status: AC
  Administered 2019-03-04: 14:00:00 120 mg via INTRAVENOUS
  Filled 2019-03-04: qty 20

## 2019-03-04 NOTE — Patient Instructions (Signed)
[facility] Discharge Instructions for Patients Receiving Chemotherapy  Today you received the following chemotherapy agents:  Taxol.  To help prevent nausea and vomiting after your treatment, we encourage you to take your nausea medication as directed.   If you develop nausea and vomiting that is not controlled by your nausea medication, call the clinic.   BELOW ARE SYMPTOMS THAT SHOULD BE REPORTED IMMEDIATELY:  *FEVER GREATER THAN 100.5 F  *CHILLS WITH OR WITHOUT FEVER  NAUSEA AND VOMITING THAT IS NOT CONTROLLED WITH YOUR NAUSEA MEDICATION  *UNUSUAL SHORTNESS OF BREATH  *UNUSUAL BRUISING OR BLEEDING  TENDERNESS IN MOUTH AND THROAT WITH OR WITHOUT PRESENCE OF ULCERS  *URINARY PROBLEMS  *BOWEL PROBLEMS  UNUSUAL RASH Items with * indicate a potential emergency and should be followed up as soon as possible.  Feel free to call the clinic should you have any questions or concerns. The clinic phone number is [phone].  Please show the CHEMO ALERT CARD at check-in to the Emergency Department and triage nurse.   

## 2019-03-05 ENCOUNTER — Inpatient Hospital Stay: Payer: BC Managed Care – PPO

## 2019-03-05 ENCOUNTER — Telehealth: Payer: Self-pay | Admitting: Hematology and Oncology

## 2019-03-05 NOTE — Progress Notes (Signed)
Nutrition Assessment   Reason for Assessment:   Weight loss and poor appetite   ASSESSMENT:  58 year old female with left breast cancer. S/p mastectomy who has completed 4 cycels of adjuvant chemotherapy and currently receiving Taxol.    RD working remotely.  Spoke with patient via phone for nutrition assessment.  Patient reports that she suffers from taste alterations and poor appetite.  Reports that she typically has eggs with cheese and sausage or bacon from breakfast sometimes grits.  Lunch is burger or chicken or salad with chicken.  Dinner typically a meat and vegetable of some kind.  Has been drinking ensure plus 1 time per day.    Denies any real issues with nausea at this time.  Reports constipation about a month ago but this has improved.    Nutrition Focused Physical Exam: deferred   Medications: ativan, zofran, compazine, VIt D   Labs: glucose 181   Anthropometrics:   Height: 66 inches Weight: 102 lb on 6/1 UBW: 125-125 lb BMI: 16  Per chart noted 122 lb on 09/2018  16% weight loss in the last 6 months, significant  Estimated Energy Needs  Kcals: 1400-1600 Protein: 70-80 g Fluid: > 1.4 L   NUTRITION DIAGNOSIS: Inadequate oral intake related to taste alterations, poor appetite as evidenced by 16% weight loss in the last 6 months    INTERVENTION:  Discussed strategies to increase calories and protein.  Will email handout Encouraged patient to increase ensure plus to BID if tolerated for additional calories and protein.  Discussed ways to use ensure as smoothie/milkshake Will email high calorie and high protein recipes to try. Discussed strategies for taste change.  Will email handout.   Contact information provided to patient.  Offered to call for follow-up and patient wants to call RD if needed in the future.    MONITORING, EVALUATION, GOAL: Patient will increase calories and protein to prevent further weight loss   Next Visit: patient to contact  RD  Bob Daversa B. Zenia Resides, [place], [place] Registered Dietitian [phone] (pager)

## 2019-03-05 NOTE — Telephone Encounter (Signed)
I left a message regarding schedule  adjustment

## 2019-03-11 ENCOUNTER — Other Ambulatory Visit: Payer: BC Managed Care – PPO

## 2019-03-11 ENCOUNTER — Ambulatory Visit: Payer: BC Managed Care – PPO

## 2019-03-18 ENCOUNTER — Inpatient Hospital Stay: Payer: BC Managed Care – PPO

## 2019-03-18 ENCOUNTER — Other Ambulatory Visit: Payer: Self-pay

## 2019-03-18 VITALS — BP 128/75 | HR 82 | Temp 98.0°F | Resp 18 | Wt 101.5 lb

## 2019-03-18 DIAGNOSIS — C50412 Malignant neoplasm of upper-outer quadrant of left female breast: Secondary | ICD-10-CM

## 2019-03-18 DIAGNOSIS — Z95828 Presence of other vascular implants and grafts: Secondary | ICD-10-CM

## 2019-03-18 DIAGNOSIS — Z5111 Encounter for antineoplastic chemotherapy: Secondary | ICD-10-CM | POA: Diagnosis not present

## 2019-03-18 LAB — CBC WITH DIFFERENTIAL (CANCER CENTER ONLY)
Abs Immature Granulocytes: 0.04 10*3/uL (ref 0.00–0.07)
Basophils Absolute: 0.1 10*3/uL (ref 0.0–0.1)
Basophils Relative: 1 %
Eosinophils Absolute: 0.1 10*3/uL (ref 0.0–0.5)
Eosinophils Relative: 1 %
HCT: 27.4 % — ABNORMAL LOW (ref 36.0–46.0)
Hemoglobin: 8.4 g/dL — ABNORMAL LOW (ref 12.0–15.0)
Immature Granulocytes: 1 %
Lymphocytes Relative: 10 %
Lymphs Abs: 0.7 10*3/uL (ref 0.7–4.0)
MCH: 25.8 pg — ABNORMAL LOW (ref 26.0–34.0)
MCHC: 30.7 g/dL (ref 30.0–36.0)
MCV: 84 fL (ref 80.0–100.0)
Monocytes Absolute: 0.7 10*3/uL (ref 0.1–1.0)
Monocytes Relative: 10 %
Neutro Abs: 5.5 10*3/uL (ref 1.7–7.7)
Neutrophils Relative %: 77 %
Platelet Count: 443 10*3/uL — ABNORMAL HIGH (ref 150–400)
RBC: 3.26 MIL/uL — ABNORMAL LOW (ref 3.87–5.11)
RDW: 16.8 % — ABNORMAL HIGH (ref 11.5–15.5)
WBC Count: 7.1 10*3/uL (ref 4.0–10.5)
nRBC: 0 % (ref 0.0–0.2)

## 2019-03-18 LAB — CMP (CANCER CENTER ONLY)
ALT: 14 U/L (ref 0–44)
AST: 33 U/L (ref 15–41)
Albumin: 3 g/dL — ABNORMAL LOW (ref 3.5–5.0)
Alkaline Phosphatase: 67 U/L (ref 38–126)
Anion gap: 10 (ref 5–15)
BUN: 11 mg/dL (ref 6–20)
CO2: 28 mmol/L (ref 22–32)
Calcium: 9.2 mg/dL (ref 8.9–10.3)
Chloride: 102 mmol/L (ref 98–111)
Creatinine: 0.77 mg/dL (ref 0.44–1.00)
GFR, Est AFR Am: >60 mL/min (ref >60)
GFR, Estimated: >60 mL/min (ref >60)
Glucose, Bld: 106 mg/dL — ABNORMAL HIGH (ref 70–99)
Potassium: 3.7 mmol/L (ref 3.5–5.1)
Sodium: 140 mmol/L (ref 135–145)
Total Bilirubin: 0.2 mg/dL — ABNORMAL LOW (ref 0.3–1.2)
Total Protein: 6.2 g/dL — ABNORMAL LOW (ref 6.5–8.1)

## 2019-03-18 MED ORDER — SODIUM CHLORIDE 0.9% FLUSH
10.0000 mL | INTRAVENOUS | Status: DC | PRN
  Administered 2019-03-18: 10 mL
  Filled 2019-03-18: qty 10

## 2019-03-18 MED ORDER — SODIUM CHLORIDE 0.9 % IV SOLN
80.0000 mg/m2 | Freq: Once | INTRAVENOUS | Status: AC
  Administered 2019-03-18: 120 mg via INTRAVENOUS
  Filled 2019-03-18: qty 20

## 2019-03-18 MED ORDER — SODIUM CHLORIDE 0.9 % IV SOLN
20.0000 mg | Freq: Once | INTRAVENOUS | Status: AC
  Administered 2019-03-18: 20 mg via INTRAVENOUS
  Filled 2019-03-18: qty 20

## 2019-03-18 MED ORDER — HEPARIN SOD (PORK) LOCK FLUSH 100 UNIT/ML IV SOLN
500.0000 [IU] | Freq: Once | INTRAVENOUS | Status: AC | PRN
  Administered 2019-03-18: 16:00:00 500 [IU]
  Filled 2019-03-18: qty 5

## 2019-03-18 MED ORDER — FAMOTIDINE IN NACL 20-0.9 MG/50ML-% IV SOLN
INTRAVENOUS | Status: AC
  Filled 2019-03-18: qty 50

## 2019-03-18 MED ORDER — FAMOTIDINE IN NACL 20-0.9 MG/50ML-% IV SOLN
20.0000 mg | Freq: Once | INTRAVENOUS | Status: AC
  Administered 2019-03-18: 14:00:00 20 mg via INTRAVENOUS

## 2019-03-18 MED ORDER — DIPHENHYDRAMINE HCL 50 MG/ML IJ SOLN
INTRAMUSCULAR | Status: AC
  Filled 2019-03-18: qty 1

## 2019-03-18 MED ORDER — DIPHENHYDRAMINE HCL 50 MG/ML IJ SOLN
50.0000 mg | Freq: Once | INTRAMUSCULAR | Status: AC
  Administered 2019-03-18: 50 mg via INTRAVENOUS

## 2019-03-18 MED ORDER — SODIUM CHLORIDE 0.9 % IV SOLN
Freq: Once | INTRAVENOUS | Status: AC
  Administered 2019-03-18: 14:00:00 via INTRAVENOUS
  Filled 2019-03-18: qty 250

## 2019-03-18 NOTE — Patient Instructions (Signed)
[facility] Discharge Instructions for Patients Receiving Chemotherapy  Today you received the following chemotherapy agents:  Taxol.  To help prevent nausea and vomiting after your treatment, we encourage you to take your nausea medication as directed.   If you develop nausea and vomiting that is not controlled by your nausea medication, call the clinic.   BELOW ARE SYMPTOMS THAT SHOULD BE REPORTED IMMEDIATELY:  *FEVER GREATER THAN 100.5 F  *CHILLS WITH OR WITHOUT FEVER  NAUSEA AND VOMITING THAT IS NOT CONTROLLED WITH YOUR NAUSEA MEDICATION  *UNUSUAL SHORTNESS OF BREATH  *UNUSUAL BRUISING OR BLEEDING  TENDERNESS IN MOUTH AND THROAT WITH OR WITHOUT PRESENCE OF ULCERS  *URINARY PROBLEMS  *BOWEL PROBLEMS  UNUSUAL RASH Items with * indicate a potential emergency and should be followed up as soon as possible.  Feel free to call the clinic should you have any questions or concerns. The clinic phone number is [phone].  Please show the CHEMO ALERT CARD at check-in to the Emergency Department and triage nurse.   

## 2019-03-23 NOTE — Progress Notes (Signed)
Patient Care Team: Emeterio Reeve, DO as PCP - General (Osteopathic Medicine) Rolm Bookbinder, MD as Consulting Physician (General Surgery) Nicholas Lose, MD as Consulting Physician (Hematology and Oncology) Eppie Gibson, MD as Attending Physician (Radiation Oncology)  DIAGNOSIS:    ICD-10-CM   1. Malignant neoplasm of upper-outer quadrant of left breast in female, estrogen receptor positive ([place])  C50.412    Z17.0     SUMMARY OF ONCOLOGIC HISTORY: Oncology History  Malignant neoplasm of upper-outer quadrant of left breast in female, estrogen receptor positive ([place])  09/12/2018 Initial Diagnosis   Palpable left breast mass at 2:30 position by ultrasound measured 1.6 cm, calcifications spanning 5.9 cm, 3 abnormal lymph nodes, multiple masses and calcifications biopsy of the mass revealed grade 2 IDC plus DCIS with lymphovascular invasion, ER 100%, PR 60%, Ki-67 30%, HER-2 negative IHC 1+, pathology of calcifications is pending, biopsy of lymph nodes is discordant benign, T1cN0 stage Ia   09/19/2018 Cancer Staging   Staging form: Breast, AJCC 8th Edition - Clinical stage from 09/19/2018: Stage IA (cT1c(m), cN0, cM0, G2, ER+, PR+, HER2-) - Signed by Nicholas Lose, MD on 09/19/2018   11/13/2018 Surgery   Left mastectomy: Grade 2 IDC, 1.6 cm, with intermediate grade DCIS, margins negative, lymphovascular invasion present, 1/7 lymph nodes positive, MammaPrint high risk, ER 100%, PR 30%, HER-2 negative, Ki-67 30% T1CN1A stage Ia   11/13/2018 Cancer Staging   Staging form: Breast, AJCC 8th Edition - Pathologic stage from 11/13/2018: Stage IA (pT1c, pN1a, cM0, G2, ER+, PR+, HER2-) - Signed by Gardenia Phlegm, NP on 11/28/2018   12/17/2018 -  Chemotherapy   The patient had DOXOrubicin (ADRIAMYCIN) chemo injection 92 mg, 60 mg/m2 = 92 mg, Intravenous,  Once, 4 of 4 cycles Administration: 92 mg (12/17/2018), 92 mg (12/31/2018), 92 mg (01/14/2019), 92 mg (01/28/2019) palonosetron  (ALOXI) injection 0.25 mg, 0.25 mg, Intravenous,  Once, 4 of 4 cycles Administration: 0.25 mg (12/17/2018), 0.25 mg (12/31/2018), 0.25 mg (01/14/2019), 0.25 mg (01/28/2019) pegfilgrastim (NEULASTA) injection 6 mg, 6 mg, Subcutaneous, Once, 2 of 2 cycles Administration: 6 mg (01/16/2019), 6 mg (01/30/2019) pegfilgrastim (NEULASTA ONPRO KIT) injection 6 mg, 6 mg, Subcutaneous, Once, 1 of 1 cycle pegfilgrastim-cbqv (UDENYCA) injection 6 mg, 6 mg, Subcutaneous, Once, 2 of 2 cycles Administration: 6 mg (12/19/2018), 6 mg (01/02/2019) cyclophosphamide (CYTOXAN) 920 mg in sodium chloride 0.9 % 250 mL chemo infusion, 600 mg/m2 = 920 mg, Intravenous,  Once, 4 of 4 cycles Administration: 920 mg (12/17/2018), 920 mg (12/31/2018), 920 mg (01/14/2019), 920 mg (01/28/2019) PACLitaxel (TAXOL) 120 mg in sodium chloride 0.9 % 250 mL chemo infusion (</= 86m/m2), 80 mg/m2 = 120 mg, Intravenous,  Once, 5 of 12 cycles Administration: 120 mg (02/11/2019), 120 mg (02/18/2019), 120 mg (02/26/2019), 120 mg (03/04/2019), 120 mg (03/18/2019) fosaprepitant (EMEND) 150 mg, dexamethasone (DECADRON) 12 mg in sodium chloride 0.9 % 145 mL IVPB, , Intravenous,  Once, 4 of 4 cycles Administration:  (12/17/2018),  (12/31/2018),  (01/14/2019),  (01/28/2019)  for chemotherapy treatment.      CHIEF COMPLIANT: Cycle 6 Taxol  INTERVAL HISTORY: Maize VKahlen Boydeis a 58y.o. with above-mentioned history of left breast cancer treated with mastectomy whocompleted 4 cycles ofadjuvant chemotherapy with dose dense Adriamycin and Cytoxan and is currently receiving weekly Taxol.Cycle 5 was delayed 1 week so she could assist her mother who had a fracture. She presents to the clinicalone today for cycle 6.   REVIEW OF SYSTEMS:   Constitutional: Denies fevers, chills or abnormal weight loss  Eyes: Denies blurriness of vision Ears, nose, mouth, throat, and face: Denies mucositis or sore throat Respiratory: Denies cough, dyspnea or wheezes Cardiovascular:  Denies palpitation, chest discomfort Gastrointestinal: Denies nausea, heartburn or change in bowel habits Skin: Denies abnormal skin rashes Lymphatics: Denies new lymphadenopathy or easy bruising Neurological: Denies numbness, tingling or new weaknesses Behavioral/Psych: Mood is stable, no new changes  Extremities: No lower extremity edema Breast: denies any pain or lumps or nodules in either breasts All other systems were reviewed with the patient and are negative.  I have reviewed the past medical history, past surgical history, social history and family history with the patient and they are unchanged from previous note.  ALLERGIES:  has No Known Allergies.  MEDICATIONS:  Current Outpatient Medications  Medication Sig Dispense Refill  . lidocaine-prilocaine (EMLA) cream Apply 1 application topically as needed. 30 g 0  . lisinopril-hydrochlorothiazide (PRINZIDE,ZESTORETIC) 10-12.5 MG tablet TAKE 1 TABLET BY MOUTH EVERY DAY 90 tablet 0  . LORazepam (ATIVAN) 0.5 MG tablet Take 1 tablet (0.5 mg total) by mouth at bedtime as needed for sleep. 30 tablet 0  . ondansetron (ZOFRAN) 8 MG tablet Take 1 tablet (8 mg total) by mouth 2 (two) times daily as needed. Start on the third day after chemotherapy. (Patient not taking: Reported on 12/04/2018) 30 tablet 1  . prochlorperazine (COMPAZINE) 10 MG tablet Take 1 tablet (10 mg total) by mouth every 6 (six) hours as needed (Nausea or vomiting). (Patient not taking: Reported on 12/04/2018) 30 tablet 1  . Vitamin D, Ergocalciferol, (DRISDOL) 1.25 MG (50000 UT) CAPS capsule Take 1 capsule (50,000 Units total) by mouth every 7 (seven) days. Take for 12 total doses(weeks) than can transition to 1000 units OTC supplement daily 12 capsule 0   No current facility-administered medications for this visit.    Facility-Administered Medications Ordered in Other Visits  Medication Dose Route Frequency Provider Last Rate Last Dose  . sodium chloride flush (NS) 0.9 %  injection 10 mL  10 mL Intravenous PRN Nicholas Lose, MD   10 mL at 12/17/18 0907    PHYSICAL EXAMINATION: ECOG PERFORMANCE STATUS: 1 - Symptomatic but completely ambulatory  There were no vitals filed for this visit. There were no vitals filed for this visit.  GENERAL: alert, no distress and comfortable SKIN: skin color, texture, turgor are normal, no rashes or significant lesions EYES: normal, Conjunctiva are pink and non-injected, sclera clear OROPHARYNX: no exudate, no erythema and lips, buccal mucosa, and tongue normal  NECK: supple, thyroid normal size, non-tender, without nodularity LYMPH: no palpable lymphadenopathy in the cervical, axillary or inguinal LUNGS: clear to auscultation and percussion with normal breathing effort HEART: regular rate & rhythm and no murmurs and no lower extremity edema ABDOMEN: abdomen soft, non-tender and normal bowel sounds MUSCULOSKELETAL: no cyanosis of digits and no clubbing  NEURO: alert & oriented x 3 with fluent speech, no focal motor/sensory deficits EXTREMITIES: No lower extremity edema  LABORATORY DATA:  I have reviewed the data as listed CMP Latest Ref Rng & Units 03/18/2019 03/04/2019 02/26/2019  Glucose 70 - 99 mg/dL 106(H) 181(H) 122(H)  BUN 6 - 20 mg/dL 11 12 12   Creatinine 0.44 - 1.00 mg/dL 0.77 0.82 0.72  Sodium 135 - 145 mmol/L 140 137 137  Potassium 3.5 - 5.1 mmol/L 3.7 3.5 3.5  Chloride 98 - 111 mmol/L 102 100 100  CO2 22 - 32 mmol/L 28 28 29   Calcium 8.9 - 10.3 mg/dL 9.2 9.8 9.3  Total Protein 6.5 -  8.1 g/dL 6.2(L) 6.6 6.5  Total Bilirubin 0.3 - 1.2 mg/dL 0.2(L) 0.4 0.2(L)  Alkaline Phos 38 - 126 U/L 67 62 69  AST 15 - 41 U/L 33 22 23  ALT 0 - 44 U/L 14 9 9     Lab Results  Component Value Date   WBC 8.3 03/25/2019   HGB 8.2 (L) 03/25/2019   HCT 27.2 (L) 03/25/2019   MCV 85.0 03/25/2019   PLT 488 (H) 03/25/2019   NEUTROABS 6.6 03/25/2019    ASSESSMENT & PLAN:  Malignant neoplasm of upper-outer quadrant of left  breast in female, estrogen receptor positive ([place]) 11/13/2018:Left mastectomy: Grade 2 IDC, 1.6 cm, with intermediate grade DCIS, margins negative, lymphovascular invasion present, 1/7 lymph nodes positive, MammaPrint high risk, ER 100%, PR 30%, HER-2 negative, Ki-67 30% T1CN1A stage Ia  Treatment plan: 1.Adjuvant chemotherapy with dose since he remains on Cytoxan x4 followed by Taxol weekly x12 2.Followed by adjuvant radiation therapy 3.Followed by adjuvant antiestrogen therapy ------------------------------------------------------------------------------------------------------------------ Current Treatment:Completed 4 cycles of dose dense Adriamycin and Cytoxan. Today is cycle6of Taxol  Labs are reviewed  Chemo toxicities: 1.Mild nausea lasts 2-3 days after chemo with loss of taste 2.Vaginal dryness 3. Chemo induced anemia: Monitoring closely. No change in treatment yet.Hemoglobin is down to 8.2today.  She had normal hemoglobin prior to starting chemotherapy.  Weight loss: Finally stabilized.   Return to clinic weekly for Taxol every other week for follow-up with me She wishes to have her appointments first in the morning.  We will see if our schedulers will have the ability to switch her to mornings.   No orders of the defined types were placed in this encounter.  The patient has a good understanding of the overall plan. she agrees with it. she will call with any problems that may develop before the next visit here.  Nicholas Lose, MD 03/25/2019  Julious Oka Dorshimer am acting as scribe for Dr. Nicholas Lose.  I have reviewed the above documentation for accuracy and completeness, and I agree with the above.

## 2019-03-25 ENCOUNTER — Inpatient Hospital Stay (HOSPITAL_BASED_OUTPATIENT_CLINIC_OR_DEPARTMENT_OTHER): Payer: BC Managed Care – PPO | Admitting: Hematology and Oncology

## 2019-03-25 ENCOUNTER — Inpatient Hospital Stay: Payer: BC Managed Care – PPO

## 2019-03-25 ENCOUNTER — Other Ambulatory Visit: Payer: Self-pay

## 2019-03-25 DIAGNOSIS — Z9221 Personal history of antineoplastic chemotherapy: Secondary | ICD-10-CM

## 2019-03-25 DIAGNOSIS — R634 Abnormal weight loss: Secondary | ICD-10-CM | POA: Diagnosis not present

## 2019-03-25 DIAGNOSIS — C50412 Malignant neoplasm of upper-outer quadrant of left female breast: Secondary | ICD-10-CM | POA: Diagnosis not present

## 2019-03-25 DIAGNOSIS — D6481 Anemia due to antineoplastic chemotherapy: Secondary | ICD-10-CM | POA: Diagnosis not present

## 2019-03-25 DIAGNOSIS — Z79899 Other long term (current) drug therapy: Secondary | ICD-10-CM

## 2019-03-25 DIAGNOSIS — Z170 Estrogen receptor positive status [ER+]: Secondary | ICD-10-CM

## 2019-03-25 DIAGNOSIS — Z95828 Presence of other vascular implants and grafts: Secondary | ICD-10-CM

## 2019-03-25 DIAGNOSIS — Z5111 Encounter for antineoplastic chemotherapy: Secondary | ICD-10-CM | POA: Diagnosis not present

## 2019-03-25 DIAGNOSIS — R5383 Other fatigue: Secondary | ICD-10-CM

## 2019-03-25 LAB — CMP (CANCER CENTER ONLY)
ALT: 44 U/L (ref 0–44)
AST: 58 U/L — ABNORMAL HIGH (ref 15–41)
Albumin: 3.1 g/dL — ABNORMAL LOW (ref 3.5–5.0)
Alkaline Phosphatase: 60 U/L (ref 38–126)
Anion gap: 9 (ref 5–15)
BUN: 13 mg/dL (ref 6–20)
CO2: 26 mmol/L (ref 22–32)
Calcium: 8.7 mg/dL — ABNORMAL LOW (ref 8.9–10.3)
Chloride: 106 mmol/L (ref 98–111)
Creatinine: 0.79 mg/dL (ref 0.44–1.00)
GFR, Est AFR Am: >60 mL/min (ref >60)
GFR, Estimated: >60 mL/min (ref >60)
Glucose, Bld: 165 mg/dL — ABNORMAL HIGH (ref 70–99)
Potassium: 3.6 mmol/L (ref 3.5–5.1)
Sodium: 141 mmol/L (ref 135–145)
Total Bilirubin: 0.2 mg/dL — ABNORMAL LOW (ref 0.3–1.2)
Total Protein: 5.8 g/dL — ABNORMAL LOW (ref 6.5–8.1)

## 2019-03-25 LAB — CBC WITH DIFFERENTIAL (CANCER CENTER ONLY)
Abs Immature Granulocytes: 0.17 10*3/uL — ABNORMAL HIGH (ref 0.00–0.07)
Basophils Absolute: 0.1 10*3/uL (ref 0.0–0.1)
Basophils Relative: 1 %
Eosinophils Absolute: 0.1 10*3/uL (ref 0.0–0.5)
Eosinophils Relative: 2 %
HCT: 27.2 % — ABNORMAL LOW (ref 36.0–46.0)
Hemoglobin: 8.2 g/dL — ABNORMAL LOW (ref 12.0–15.0)
Immature Granulocytes: 2 %
Lymphocytes Relative: 9 %
Lymphs Abs: 0.8 10*3/uL (ref 0.7–4.0)
MCH: 25.6 pg — ABNORMAL LOW (ref 26.0–34.0)
MCHC: 30.1 g/dL (ref 30.0–36.0)
MCV: 85 fL (ref 80.0–100.0)
Monocytes Absolute: 0.6 10*3/uL (ref 0.1–1.0)
Monocytes Relative: 7 %
Neutro Abs: 6.6 10*3/uL (ref 1.7–7.7)
Neutrophils Relative %: 79 %
Platelet Count: 488 10*3/uL — ABNORMAL HIGH (ref 150–400)
RBC: 3.2 MIL/uL — ABNORMAL LOW (ref 3.87–5.11)
RDW: 18.1 % — ABNORMAL HIGH (ref 11.5–15.5)
WBC Count: 8.3 10*3/uL (ref 4.0–10.5)
nRBC: 0 % (ref 0.0–0.2)

## 2019-03-25 MED ORDER — SODIUM CHLORIDE 0.9 % IV SOLN
20.0000 mg | Freq: Once | INTRAVENOUS | Status: AC
  Administered 2019-03-25: 20 mg via INTRAVENOUS
  Filled 2019-03-25: qty 2

## 2019-03-25 MED ORDER — DIPHENHYDRAMINE HCL 50 MG/ML IJ SOLN
50.0000 mg | Freq: Once | INTRAMUSCULAR | Status: AC
  Administered 2019-03-25: 50 mg via INTRAVENOUS

## 2019-03-25 MED ORDER — SODIUM CHLORIDE 0.9 % IV SOLN
Freq: Once | INTRAVENOUS | Status: AC
  Administered 2019-03-25: 10:00:00 via INTRAVENOUS
  Filled 2019-03-25: qty 250

## 2019-03-25 MED ORDER — DIPHENHYDRAMINE HCL 50 MG/ML IJ SOLN
INTRAMUSCULAR | Status: AC
  Filled 2019-03-25: qty 1

## 2019-03-25 MED ORDER — FAMOTIDINE IN NACL 20-0.9 MG/50ML-% IV SOLN
20.0000 mg | Freq: Once | INTRAVENOUS | Status: AC
  Administered 2019-03-25: 20 mg via INTRAVENOUS

## 2019-03-25 MED ORDER — HEPARIN SOD (PORK) LOCK FLUSH 100 UNIT/ML IV SOLN
500.0000 [IU] | Freq: Once | INTRAVENOUS | Status: AC | PRN
  Administered 2019-03-25: 500 [IU]
  Filled 2019-03-25: qty 5

## 2019-03-25 MED ORDER — SODIUM CHLORIDE 0.9% FLUSH
10.0000 mL | INTRAVENOUS | Status: DC | PRN
  Administered 2019-03-25: 10 mL
  Filled 2019-03-25: qty 10

## 2019-03-25 MED ORDER — SODIUM CHLORIDE 0.9% FLUSH
10.0000 mL | INTRAVENOUS | Status: DC | PRN
  Administered 2019-03-25: 08:00:00 10 mL
  Filled 2019-03-25: qty 10

## 2019-03-25 MED ORDER — SODIUM CHLORIDE 0.9 % IV SOLN
80.0000 mg/m2 | Freq: Once | INTRAVENOUS | Status: AC
  Administered 2019-03-25: 120 mg via INTRAVENOUS
  Filled 2019-03-25: qty 20

## 2019-03-25 MED ORDER — FAMOTIDINE IN NACL 20-0.9 MG/50ML-% IV SOLN
INTRAVENOUS | Status: AC
  Filled 2019-03-25: qty 50

## 2019-03-25 NOTE — Patient Instructions (Signed)
[facility] Discharge Instructions for Patients Receiving Chemotherapy  Today you received the following chemotherapy agents:  Taxol.  To help prevent nausea and vomiting after your treatment, we encourage you to take your nausea medication as directed.   If you develop nausea and vomiting that is not controlled by your nausea medication, call the clinic.   BELOW ARE SYMPTOMS THAT SHOULD BE REPORTED IMMEDIATELY:  *FEVER GREATER THAN 100.5 F  *CHILLS WITH OR WITHOUT FEVER  NAUSEA AND VOMITING THAT IS NOT CONTROLLED WITH YOUR NAUSEA MEDICATION  *UNUSUAL SHORTNESS OF BREATH  *UNUSUAL BRUISING OR BLEEDING  TENDERNESS IN MOUTH AND THROAT WITH OR WITHOUT PRESENCE OF ULCERS  *URINARY PROBLEMS  *BOWEL PROBLEMS  UNUSUAL RASH Items with * indicate a potential emergency and should be followed up as soon as possible.  Feel free to call the clinic should you have any questions or concerns. The clinic phone number is [phone].  Please show the CHEMO ALERT CARD at check-in to the Emergency Department and triage nurse.   

## 2019-03-25 NOTE — Assessment & Plan Note (Signed)
11/13/2018:Left mastectomy: Grade 2 IDC, 1.6 cm, with intermediate grade DCIS, margins negative, lymphovascular invasion present, 1/7 lymph nodes positive, MammaPrint high risk, ER 100%, PR 30%, HER-2 negative, Ki-67 30% T1CN1A stage Ia  Treatment plan: 1.Adjuvant chemotherapy with dose since he remains on Cytoxan x4 followed by Taxol weekly x12 2.Followed by adjuvant radiation therapy 3.Followed by adjuvant antiestrogen therapy ------------------------------------------------------------------------------------------------------------------ Current Treatment:Completed 4 cycles of dose dense Adriamycin and Cytoxan. Today is cycle4of Taxol  Labs are reviewed  Chemo toxicities: 1.Mild nausea lasts 2-3 days after chemo with loss of taste 2.Vaginal dryness 3. Chemo induced anemia: Monitoring closely. No change in treatment yet.Hemoglobin is down to 8.7today.  Weight loss: Patient is trying to add Ensure to her diet. She has poor appetite and lack of taste.  Consulted dietitian Patient is gone next week to help take care of her mother who had a fracture.

## 2019-03-26 ENCOUNTER — Telehealth: Payer: Self-pay | Admitting: Hematology and Oncology

## 2019-03-26 NOTE — Telephone Encounter (Signed)
I talk with patient regarding schedule 6/29 could not be adjusted

## 2019-04-01 ENCOUNTER — Other Ambulatory Visit: Payer: Self-pay

## 2019-04-01 ENCOUNTER — Inpatient Hospital Stay: Payer: BC Managed Care – PPO

## 2019-04-01 VITALS — BP 111/79 | HR 79 | Temp 97.7°F | Resp 20

## 2019-04-01 DIAGNOSIS — Z170 Estrogen receptor positive status [ER+]: Secondary | ICD-10-CM

## 2019-04-01 DIAGNOSIS — Z5111 Encounter for antineoplastic chemotherapy: Secondary | ICD-10-CM | POA: Diagnosis not present

## 2019-04-01 DIAGNOSIS — C50412 Malignant neoplasm of upper-outer quadrant of left female breast: Secondary | ICD-10-CM

## 2019-04-01 DIAGNOSIS — Z95828 Presence of other vascular implants and grafts: Secondary | ICD-10-CM

## 2019-04-01 LAB — CBC WITH DIFFERENTIAL (CANCER CENTER ONLY)
Abs Immature Granulocytes: 0.04 10*3/uL (ref 0.00–0.07)
Basophils Absolute: 0 10*3/uL (ref 0.0–0.1)
Basophils Relative: 0 %
Eosinophils Absolute: 0.1 10*3/uL (ref 0.0–0.5)
Eosinophils Relative: 1 %
HCT: 28.4 % — ABNORMAL LOW (ref 36.0–46.0)
Hemoglobin: 8.5 g/dL — ABNORMAL LOW (ref 12.0–15.0)
Immature Granulocytes: 1 %
Lymphocytes Relative: 12 %
Lymphs Abs: 0.6 10*3/uL — ABNORMAL LOW (ref 0.7–4.0)
MCH: 26 pg (ref 26.0–34.0)
MCHC: 29.9 g/dL — ABNORMAL LOW (ref 30.0–36.0)
MCV: 86.9 fL (ref 80.0–100.0)
Monocytes Absolute: 0.5 10*3/uL (ref 0.1–1.0)
Monocytes Relative: 8 %
Neutro Abs: 4.2 10*3/uL (ref 1.7–7.7)
Neutrophils Relative %: 78 %
Platelet Count: 360 10*3/uL (ref 150–400)
RBC: 3.27 MIL/uL — ABNORMAL LOW (ref 3.87–5.11)
RDW: 19.5 % — ABNORMAL HIGH (ref 11.5–15.5)
WBC Count: 5.4 10*3/uL (ref 4.0–10.5)
nRBC: 0 % (ref 0.0–0.2)

## 2019-04-01 LAB — CMP (CANCER CENTER ONLY)
ALT: 59 U/L — ABNORMAL HIGH (ref 0–44)
AST: 63 U/L — ABNORMAL HIGH (ref 15–41)
Albumin: 3.4 g/dL — ABNORMAL LOW (ref 3.5–5.0)
Alkaline Phosphatase: 69 U/L (ref 38–126)
Anion gap: 8 (ref 5–15)
BUN: 11 mg/dL (ref 6–20)
CO2: 27 mmol/L (ref 22–32)
Calcium: 8.6 mg/dL — ABNORMAL LOW (ref 8.9–10.3)
Chloride: 106 mmol/L (ref 98–111)
Creatinine: 0.7 mg/dL (ref 0.44–1.00)
GFR, Est AFR Am: >60 mL/min (ref >60)
GFR, Estimated: >60 mL/min (ref >60)
Glucose, Bld: 109 mg/dL — ABNORMAL HIGH (ref 70–99)
Potassium: 3.5 mmol/L (ref 3.5–5.1)
Sodium: 141 mmol/L (ref 135–145)
Total Bilirubin: 0.2 mg/dL — ABNORMAL LOW (ref 0.3–1.2)
Total Protein: 6 g/dL — ABNORMAL LOW (ref 6.5–8.1)

## 2019-04-01 MED ORDER — SODIUM CHLORIDE 0.9 % IV SOLN
20.0000 mg | Freq: Once | INTRAVENOUS | Status: AC
  Administered 2019-04-01: 20 mg via INTRAVENOUS
  Filled 2019-04-01: qty 20

## 2019-04-01 MED ORDER — SODIUM CHLORIDE 0.9% FLUSH
10.0000 mL | INTRAVENOUS | Status: DC | PRN
  Administered 2019-04-01: 10 mL
  Filled 2019-04-01: qty 10

## 2019-04-01 MED ORDER — HEPARIN SOD (PORK) LOCK FLUSH 100 UNIT/ML IV SOLN
500.0000 [IU] | Freq: Once | INTRAVENOUS | Status: AC | PRN
  Administered 2019-04-01: 500 [IU]
  Filled 2019-04-01: qty 5

## 2019-04-01 MED ORDER — FAMOTIDINE IN NACL 20-0.9 MG/50ML-% IV SOLN
20.0000 mg | Freq: Once | INTRAVENOUS | Status: AC
  Administered 2019-04-01: 20 mg via INTRAVENOUS

## 2019-04-01 MED ORDER — SODIUM CHLORIDE 0.9 % IV SOLN
80.0000 mg/m2 | Freq: Once | INTRAVENOUS | Status: AC
  Administered 2019-04-01: 120 mg via INTRAVENOUS
  Filled 2019-04-01: qty 20

## 2019-04-01 MED ORDER — DIPHENHYDRAMINE HCL 50 MG/ML IJ SOLN
INTRAMUSCULAR | Status: AC
  Filled 2019-04-01: qty 1

## 2019-04-01 MED ORDER — SODIUM CHLORIDE 0.9 % IV SOLN
Freq: Once | INTRAVENOUS | Status: AC
  Administered 2019-04-01: 14:00:00 via INTRAVENOUS
  Filled 2019-04-01: qty 250

## 2019-04-01 MED ORDER — FAMOTIDINE IN NACL 20-0.9 MG/50ML-% IV SOLN
INTRAVENOUS | Status: AC
  Filled 2019-04-01: qty 50

## 2019-04-01 MED ORDER — DIPHENHYDRAMINE HCL 50 MG/ML IJ SOLN
50.0000 mg | Freq: Once | INTRAMUSCULAR | Status: AC
  Administered 2019-04-01: 50 mg via INTRAVENOUS

## 2019-04-01 NOTE — Patient Instructions (Signed)
[facility] Discharge Instructions for Patients Receiving Chemotherapy  Today you received the following chemotherapy agents Paclitaxel (TAXOL).  To help prevent nausea and vomiting after your treatment, we encourage you to take your nausea medication as prescribed.  If you develop nausea and vomiting that is not controlled by your nausea medication, call the clinic.   BELOW ARE SYMPTOMS THAT SHOULD BE REPORTED IMMEDIATELY:  *FEVER GREATER THAN 100.5 F  *CHILLS WITH OR WITHOUT FEVER  NAUSEA AND VOMITING THAT IS NOT CONTROLLED WITH YOUR NAUSEA MEDICATION  *UNUSUAL SHORTNESS OF BREATH  *UNUSUAL BRUISING OR BLEEDING  TENDERNESS IN MOUTH AND THROAT WITH OR WITHOUT PRESENCE OF ULCERS  *URINARY PROBLEMS  *BOWEL PROBLEMS  UNUSUAL RASH Items with * indicate a potential emergency and should be followed up as soon as possible.  Feel free to call the clinic should you have any questions or concerns. The clinic phone number is [phone].  Please show the CHEMO ALERT CARD at check-in to the Emergency Department and triage nurse.  Coronavirus (COVID-19) Are you at risk?  Are you at risk for the Coronavirus (COVID-19)?  To be considered HIGH RISK for Coronavirus (COVID-19), you have to meet the following criteria:  . Traveled to [place], [place], [place], [place] or [place]; or in the [place] to [place], [place], [place], or [place]; and have fever, cough, and shortness of breath within the last 2 weeks of travel OR . Been in close contact with a person diagnosed with COVID-19 within the last 2 weeks and have fever, cough, and shortness of breath . IF YOU DO NOT MEET THESE CRITERIA, YOU ARE CONSIDERED LOW RISK FOR COVID-19.  What to do if you are HIGH RISK for COVID-19?  . If you are having a medical emergency, call 911. . Seek medical care right away. Before you go to a doctor's office, urgent care or emergency department, call ahead and tell them about  your recent travel, contact with someone diagnosed with COVID-19, and your symptoms. You should receive instructions from your physician's office regarding next steps of care.  . When you arrive at healthcare provider, tell the healthcare staff immediately you have returned from visiting [place], [place], [place], [place] or [place]; or traveled in the [place] to [place], [place], [place], or [place]; in the last two weeks or you have been in close contact with a person diagnosed with COVID-19 in the last 2 weeks.   . Tell the health care staff about your symptoms: fever, cough and shortness of breath. . After you have been seen by a medical provider, you will be either: o Tested for (COVID-19) and discharged home on quarantine except to seek medical care if symptoms worsen, and asked to  - Stay home and avoid contact with others until you get your results (4-5 days)  - Avoid travel on public transportation if possible (such as bus, train, or airplane) or o Sent to the Emergency Department by EMS for evaluation, COVID-19 testing, and possible admission depending on your condition and test results.  What to do if you are LOW RISK for COVID-19?  Reduce your risk of any infection by using the same precautions used for avoiding the common cold or flu:  . Wash your hands often with soap and warm water for at least 20 seconds.  If soap and water are not readily available, use an alcohol-based hand sanitizer with at least 60% alcohol.  . If coughing or sneezing,   cover your mouth and nose by coughing or sneezing into the elbow areas of your shirt or coat, into a tissue or into your sleeve (not your hands). . Avoid shaking hands with others and consider head nods or verbal greetings only. . Avoid touching your eyes, nose, or mouth with unwashed hands.  . Avoid close contact with people who are sick. . Avoid places or events with large numbers of people in one location, like concerts or sporting  events. . Carefully consider travel plans you have or are making. . If you are planning any travel outside or inside the [place], visit the CDC's Travelers' Health webpage for the latest health notices. . If you have some symptoms but not all symptoms, continue to monitor at home and seek medical attention if your symptoms worsen. . If you are having a medical emergency, call 911.   ADDITIONAL HEALTHCARE OPTIONS FOR PATIENTS  [facility] / e-Visit: [email or web address]         [facility]: [phone]  [facility]: [phone]                   [facility]: [phone]     

## 2019-04-01 NOTE — Assessment & Plan Note (Signed)
11/13/2018:Left mastectomy: Grade 2 IDC, 1.6 cm, with intermediate grade DCIS, margins negative, lymphovascular invasion present, 1/7 lymph nodes positive, MammaPrint high risk, ER 100%, PR 30%, HER-2 negative, Ki-67 30% T1CN1A stage Ia  Treatment plan: 1.Adjuvant chemotherapy with dose since he remains on Cytoxan x4 followed by Taxol weekly x12 2.Followed by adjuvant radiation therapy 3.Followed by adjuvant antiestrogen therapy ------------------------------------------------------------------------------------------------------------------ Current Treatment:Completed 4 cycles of dose dense Adriamycin and Cytoxan. Today is cycle8of Taxol  Labs are reviewed  Chemo toxicities: 1.Mild nausea lasts 2-3 days after chemo with loss of taste 2.Vaginal dryness 3. Chemo induced anemia: Monitoring closely. No change in treatment yet.Hemoglobin is down to 8.2today.  She had normal hemoglobin prior to starting chemotherapy.  Weight loss: Finally stabilized.   Return to clinic weekly for Taxol every other week for follow-up with me

## 2019-04-05 NOTE — Progress Notes (Signed)
Patient Care Team: Emeterio Reeve, DO as PCP - General (Osteopathic Medicine) Rolm Bookbinder, MD as Consulting Physician (General Surgery) Nicholas Lose, MD as Consulting Physician (Hematology and Oncology) Eppie Gibson, MD as Attending Physician (Radiation Oncology)  DIAGNOSIS:    ICD-10-CM   1. Malignant neoplasm of upper-outer quadrant of left breast in female, estrogen receptor positive ([place])  C50.412    Z17.0     SUMMARY OF ONCOLOGIC HISTORY: Oncology History  Malignant neoplasm of upper-outer quadrant of left breast in female, estrogen receptor positive ([place])  09/12/2018 Initial Diagnosis   Palpable left breast mass at 2:30 position by ultrasound measured 1.6 cm, calcifications spanning 5.9 cm, 3 abnormal lymph nodes, multiple masses and calcifications biopsy of the mass revealed grade 2 IDC plus DCIS with lymphovascular invasion, ER 100%, PR 60%, Ki-67 30%, HER-2 negative IHC 1+, pathology of calcifications is pending, biopsy of lymph nodes is discordant benign, T1cN0 stage Ia   09/19/2018 Cancer Staging   Staging form: Breast, AJCC 8th Edition - Clinical stage from 09/19/2018: Stage IA (cT1c(m), cN0, cM0, G2, ER+, PR+, HER2-) - Signed by Nicholas Lose, MD on 09/19/2018   11/13/2018 Surgery   Left mastectomy: Grade 2 IDC, 1.6 cm, with intermediate grade DCIS, margins negative, lymphovascular invasion present, 1/7 lymph nodes positive, MammaPrint high risk, ER 100%, PR 30%, HER-2 negative, Ki-67 30% T1CN1A stage Ia   11/13/2018 Cancer Staging   Staging form: Breast, AJCC 8th Edition - Pathologic stage from 11/13/2018: Stage IA (pT1c, pN1a, cM0, G2, ER+, PR+, HER2-) - Signed by Gardenia Phlegm, NP on 11/28/2018   12/17/2018 -  Chemotherapy   The patient had DOXOrubicin (ADRIAMYCIN) chemo injection 92 mg, 60 mg/m2 = 92 mg, Intravenous,  Once, 4 of 4 cycles Administration: 92 mg (12/17/2018), 92 mg (12/31/2018), 92 mg (01/14/2019), 92 mg (01/28/2019) palonosetron  (ALOXI) injection 0.25 mg, 0.25 mg, Intravenous,  Once, 4 of 4 cycles Administration: 0.25 mg (12/17/2018), 0.25 mg (12/31/2018), 0.25 mg (01/14/2019), 0.25 mg (01/28/2019) pegfilgrastim (NEULASTA) injection 6 mg, 6 mg, Subcutaneous, Once, 2 of 2 cycles Administration: 6 mg (01/16/2019), 6 mg (01/30/2019) pegfilgrastim (NEULASTA ONPRO KIT) injection 6 mg, 6 mg, Subcutaneous, Once, 1 of 1 cycle pegfilgrastim-cbqv (UDENYCA) injection 6 mg, 6 mg, Subcutaneous, Once, 2 of 2 cycles Administration: 6 mg (12/19/2018), 6 mg (01/02/2019) cyclophosphamide (CYTOXAN) 920 mg in sodium chloride 0.9 % 250 mL chemo infusion, 600 mg/m2 = 920 mg, Intravenous,  Once, 4 of 4 cycles Administration: 920 mg (12/17/2018), 920 mg (12/31/2018), 920 mg (01/14/2019), 920 mg (01/28/2019) PACLitaxel (TAXOL) 120 mg in sodium chloride 0.9 % 250 mL chemo infusion (</= 79m/m2), 80 mg/m2 = 120 mg, Intravenous,  Once, 7 of 12 cycles Dose modification: 60 mg/m2 (original dose 80 mg/m2, Cycle 12, Reason: Dose not tolerated) Administration: 120 mg (02/11/2019), 120 mg (02/18/2019), 120 mg (02/26/2019), 120 mg (03/04/2019), 120 mg (03/18/2019), 120 mg (03/25/2019), 120 mg (04/01/2019) fosaprepitant (EMEND) 150 mg, dexamethasone (DECADRON) 12 mg in sodium chloride 0.9 % 145 mL IVPB, , Intravenous,  Once, 4 of 4 cycles Administration:  (12/17/2018),  (12/31/2018),  (01/14/2019),  (01/28/2019)  for chemotherapy treatment.      CHIEF COMPLIANT: Cycle 8 Taxol  INTERVAL HISTORY: Heather [name] a 58y.o. with above-mentioned history of left breast cancer treated with mastectomy whocompleted 4 cycles ofadjuvant chemotherapy with dose dense Adriamycin and Cytoxan and is currently receiving weekly Taxol.She presents to the clinicalone today for cycle 8.   REVIEW OF SYSTEMS:   Constitutional: Denies fevers, chills  or abnormal weight loss Eyes: Denies blurriness of vision Ears, nose, mouth, throat, and face: Denies mucositis or sore throat Respiratory:  Denies cough, dyspnea or wheezes Cardiovascular: Denies palpitation, chest discomfort Gastrointestinal: Denies nausea, heartburn or change in bowel habits Skin: Denies abnormal skin rashes Lymphatics: Denies new lymphadenopathy or easy bruising Neurological: Denies numbness, tingling or new weaknesses Behavioral/Psych: Mood is stable, no new changes  Extremities: No lower extremity edema Breast: denies any pain or lumps or nodules in either breasts All other systems were reviewed with the patient and are negative.  I have reviewed the past medical history, past surgical history, social history and family history with the patient and they are unchanged from previous note.  ALLERGIES:  has No Known Allergies.  MEDICATIONS:  Current Outpatient Medications  Medication Sig Dispense Refill  . lidocaine-prilocaine (EMLA) cream Apply 1 application topically as needed. 30 g 0  . lisinopril-hydrochlorothiazide (PRINZIDE,ZESTORETIC) 10-12.5 MG tablet TAKE 1 TABLET BY MOUTH EVERY DAY 90 tablet 0  . LORazepam (ATIVAN) 0.5 MG tablet Take 1 tablet (0.5 mg total) by mouth at bedtime as needed for sleep. 30 tablet 0  . ondansetron (ZOFRAN) 8 MG tablet Take 1 tablet (8 mg total) by mouth 2 (two) times daily as needed. Start on the third day after chemotherapy. (Patient not taking: Reported on 12/04/2018) 30 tablet 1  . prochlorperazine (COMPAZINE) 10 MG tablet Take 1 tablet (10 mg total) by mouth every 6 (six) hours as needed (Nausea or vomiting). (Patient not taking: Reported on 12/04/2018) 30 tablet 1   No current facility-administered medications for this visit.    Facility-Administered Medications Ordered in Other Visits  Medication Dose Route Frequency Provider Last Rate Last Dose  . sodium chloride flush (NS) 0.9 % injection 10 mL  10 mL Intravenous PRN Nicholas Lose, MD   10 mL at 12/17/18 0907    PHYSICAL EXAMINATION: ECOG PERFORMANCE STATUS: 1 - Symptomatic but completely ambulatory  Vitals:    04/08/19 0838  BP: 120/72  Pulse: 91  Resp: 17  Temp: 97.8 F (36.6 C)  SpO2: 100%   Filed Weights   04/08/19 0838  Weight: 103 lb 11.2 oz (47 kg)    GENERAL: alert, no distress and comfortable SKIN: skin color, texture, turgor are normal, no rashes or significant lesions EYES: normal, Conjunctiva are pink and non-injected, sclera clear OROPHARYNX: no exudate, no erythema and lips, buccal mucosa, and tongue normal  NECK: supple, thyroid normal size, non-tender, without nodularity LYMPH: no palpable lymphadenopathy in the cervical, axillary or inguinal LUNGS: clear to auscultation and percussion with normal breathing effort HEART: regular rate & rhythm and no murmurs and no lower extremity edema ABDOMEN: abdomen soft, non-tender and normal bowel sounds MUSCULOSKELETAL: no cyanosis of digits and no clubbing  NEURO: alert & oriented x 3 with fluent speech, no focal motor/sensory deficits EXTREMITIES: No lower extremity edema  LABORATORY DATA:  I have reviewed the data as listed CMP Latest Ref Rng & Units 04/01/2019 03/25/2019 03/18/2019  Glucose 70 - 99 mg/dL 109(H) 165(H) 106(H)  BUN 6 - 20 mg/dL 11 13 11   Creatinine 0.44 - 1.00 mg/dL 0.70 0.79 0.77  Sodium 135 - 145 mmol/L 141 141 140  Potassium 3.5 - 5.1 mmol/L 3.5 3.6 3.7  Chloride 98 - 111 mmol/L 106 106 102  CO2 22 - 32 mmol/L 27 26 28   Calcium 8.9 - 10.3 mg/dL 8.6(L) 8.7(L) 9.2  Total Protein 6.5 - 8.1 g/dL 6.0(L) 5.8(L) 6.2(L)  Total Bilirubin 0.3 - 1.2  mg/dL 0.2(L) 0.2(L) 0.2(L)  Alkaline Phos 38 - 126 U/L 69 60 67  AST 15 - 41 U/L 63(H) 58(H) 33  ALT 0 - 44 U/L 59(H) 44 14    Lab Results  Component Value Date   WBC 5.2 04/08/2019   HGB 9.6 (L) 04/08/2019   HCT 31.7 (L) 04/08/2019   MCV 86.8 04/08/2019   PLT 348 04/08/2019   NEUTROABS 4.1 04/08/2019    ASSESSMENT & PLAN:  Malignant neoplasm of upper-outer quadrant of left breast in female, estrogen receptor positive ([place]) 11/13/2018:Left mastectomy: Grade 2  IDC, 1.6 cm, with intermediate grade DCIS, margins negative, lymphovascular invasion present, 1/7 lymph nodes positive, MammaPrint high risk, ER 100%, PR 30%, HER-2 negative, Ki-67 30% T1CN1A stage Ia  Treatment plan: 1.Adjuvant chemotherapy with dose since he remains on Cytoxan x4 followed by Taxol weekly x12 2.Followed by adjuvant radiation therapy 3.Followed by adjuvant antiestrogen therapy ------------------------------------------------------------------------------------------------------------------ Current Treatment:Completed 4 cycles of dose dense Adriamycin and Cytoxan. Today is cycle8of Taxol  Labs are reviewed  Chemo toxicities: 1.Mild nausea lasts 2-3 days after chemo with loss of taste 2.Vaginal dryness 3. Chemo induced anemia: Monitoring closely. No change in treatment yet.Hemoglobin is down to 9.6today.  She had normal hemoglobin prior to starting chemotherapy. 4.  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  I will reduce the dosage of Taxol today. 5.  Mild elevation of AST and ALT: Okay to continue the treatment.  We will watch these levels closely.  Weight loss: Finally stabilized.  I discussed with her that she will definitely need radiation at the end of chemotherapy. She did not remember our discussion regarding radiation.  Dr. Iran Planas also mention this to her.  She is disappointed that she is not completely done with the cancer treatment. I also reminded her that she needs 12 cycles of chemotherapy and not to 10 as she was imagining.  Return to clinic weekly for Taxol every other week for follow-up with me    No orders of the defined types were placed in this encounter.  The patient has a good understanding of the overall plan. she agrees with it. she will call with any problems that may develop before the next visit here.  Nicholas Lose, MD 04/08/2019  Julious Oka Dorshimer am acting as scribe for Dr. Nicholas Lose.  I have  reviewed the above documentation for accuracy and completeness, and I agree with the above.

## 2019-04-08 ENCOUNTER — Inpatient Hospital Stay (HOSPITAL_BASED_OUTPATIENT_CLINIC_OR_DEPARTMENT_OTHER): Payer: BC Managed Care – PPO | Admitting: Hematology and Oncology

## 2019-04-08 ENCOUNTER — Encounter: Payer: Self-pay | Admitting: *Deleted

## 2019-04-08 ENCOUNTER — Inpatient Hospital Stay: Payer: BC Managed Care – PPO

## 2019-04-08 ENCOUNTER — Other Ambulatory Visit: Payer: Self-pay

## 2019-04-08 ENCOUNTER — Ambulatory Visit: Payer: BC Managed Care – PPO

## 2019-04-08 ENCOUNTER — Other Ambulatory Visit: Payer: BC Managed Care – PPO

## 2019-04-08 ENCOUNTER — Ambulatory Visit: Payer: BC Managed Care – PPO | Admitting: Hematology and Oncology

## 2019-04-08 ENCOUNTER — Inpatient Hospital Stay: Payer: BC Managed Care – PPO | Attending: Genetic Counselor

## 2019-04-08 DIAGNOSIS — Z95828 Presence of other vascular implants and grafts: Secondary | ICD-10-CM

## 2019-04-08 DIAGNOSIS — G620 Drug-induced polyneuropathy: Secondary | ICD-10-CM | POA: Diagnosis not present

## 2019-04-08 DIAGNOSIS — D6481 Anemia due to antineoplastic chemotherapy: Secondary | ICD-10-CM | POA: Insufficient documentation

## 2019-04-08 DIAGNOSIS — C50412 Malignant neoplasm of upper-outer quadrant of left female breast: Secondary | ICD-10-CM | POA: Diagnosis present

## 2019-04-08 DIAGNOSIS — Z170 Estrogen receptor positive status [ER+]: Secondary | ICD-10-CM

## 2019-04-08 DIAGNOSIS — Z5111 Encounter for antineoplastic chemotherapy: Secondary | ICD-10-CM | POA: Diagnosis present

## 2019-04-08 DIAGNOSIS — R634 Abnormal weight loss: Secondary | ICD-10-CM

## 2019-04-08 DIAGNOSIS — Z79899 Other long term (current) drug therapy: Secondary | ICD-10-CM | POA: Diagnosis not present

## 2019-04-08 LAB — CMP (CANCER CENTER ONLY)
ALT: 50 U/L — ABNORMAL HIGH (ref 0–44)
AST: 49 U/L — ABNORMAL HIGH (ref 15–41)
Albumin: 3.8 g/dL (ref 3.5–5.0)
Alkaline Phosphatase: 65 U/L (ref 38–126)
Anion gap: 10 (ref 5–15)
BUN: 14 mg/dL (ref 6–20)
CO2: 24 mmol/L (ref 22–32)
Calcium: 9 mg/dL (ref 8.9–10.3)
Chloride: 105 mmol/L (ref 98–111)
Creatinine: 0.76 mg/dL (ref 0.44–1.00)
GFR, Est AFR Am: >60 mL/min (ref >60)
GFR, Estimated: >60 mL/min (ref >60)
Glucose, Bld: 107 mg/dL — ABNORMAL HIGH (ref 70–99)
Potassium: 3.9 mmol/L (ref 3.5–5.1)
Sodium: 139 mmol/L (ref 135–145)
Total Bilirubin: 0.3 mg/dL (ref 0.3–1.2)
Total Protein: 6.4 g/dL — ABNORMAL LOW (ref 6.5–8.1)

## 2019-04-08 LAB — CBC WITH DIFFERENTIAL (CANCER CENTER ONLY)
Abs Immature Granulocytes: 0.04 10*3/uL (ref 0.00–0.07)
Basophils Absolute: 0 10*3/uL (ref 0.0–0.1)
Basophils Relative: 1 %
Eosinophils Absolute: 0.1 10*3/uL (ref 0.0–0.5)
Eosinophils Relative: 1 %
HCT: 31.7 % — ABNORMAL LOW (ref 36.0–46.0)
Hemoglobin: 9.6 g/dL — ABNORMAL LOW (ref 12.0–15.0)
Immature Granulocytes: 1 %
Lymphocytes Relative: 11 %
Lymphs Abs: 0.6 10*3/uL — ABNORMAL LOW (ref 0.7–4.0)
MCH: 26.3 pg (ref 26.0–34.0)
MCHC: 30.3 g/dL (ref 30.0–36.0)
MCV: 86.8 fL (ref 80.0–100.0)
Monocytes Absolute: 0.5 10*3/uL (ref 0.1–1.0)
Monocytes Relative: 9 %
Neutro Abs: 4.1 10*3/uL (ref 1.7–7.7)
Neutrophils Relative %: 77 %
Platelet Count: 348 10*3/uL (ref 150–400)
RBC: 3.65 MIL/uL — ABNORMAL LOW (ref 3.87–5.11)
RDW: 19.7 % — ABNORMAL HIGH (ref 11.5–15.5)
WBC Count: 5.2 10*3/uL (ref 4.0–10.5)
nRBC: 0 % (ref 0.0–0.2)

## 2019-04-08 MED ORDER — HEPARIN SOD (PORK) LOCK FLUSH 100 UNIT/ML IV SOLN
500.0000 [IU] | Freq: Once | INTRAVENOUS | Status: AC | PRN
  Administered 2019-04-08: 500 [IU]
  Filled 2019-04-08: qty 5

## 2019-04-08 MED ORDER — SODIUM CHLORIDE 0.9 % IV SOLN
Freq: Once | INTRAVENOUS | Status: AC
  Administered 2019-04-08: 10:00:00 via INTRAVENOUS
  Filled 2019-04-08: qty 250

## 2019-04-08 MED ORDER — SODIUM CHLORIDE 0.9% FLUSH
10.0000 mL | INTRAVENOUS | Status: DC | PRN
  Administered 2019-04-08: 10 mL
  Filled 2019-04-08: qty 10

## 2019-04-08 MED ORDER — SODIUM CHLORIDE 0.9 % IV SOLN
60.0000 mg/m2 | Freq: Once | INTRAVENOUS | Status: AC
  Administered 2019-04-08: 90 mg via INTRAVENOUS
  Filled 2019-04-08: qty 15

## 2019-04-08 MED ORDER — SODIUM CHLORIDE 0.9 % IV SOLN
20.0000 mg | Freq: Once | INTRAVENOUS | Status: AC
  Administered 2019-04-08: 20 mg via INTRAVENOUS
  Filled 2019-04-08: qty 20

## 2019-04-08 MED ORDER — FAMOTIDINE IN NACL 20-0.9 MG/50ML-% IV SOLN
20.0000 mg | Freq: Once | INTRAVENOUS | Status: AC
  Administered 2019-04-08: 10:00:00 20 mg via INTRAVENOUS

## 2019-04-08 MED ORDER — FAMOTIDINE IN NACL 20-0.9 MG/50ML-% IV SOLN
INTRAVENOUS | Status: AC
  Filled 2019-04-08: qty 50

## 2019-04-08 MED ORDER — DIPHENHYDRAMINE HCL 50 MG/ML IJ SOLN
INTRAMUSCULAR | Status: AC
  Filled 2019-04-08: qty 1

## 2019-04-08 MED ORDER — DIPHENHYDRAMINE HCL 50 MG/ML IJ SOLN
50.0000 mg | Freq: Once | INTRAMUSCULAR | Status: AC
  Administered 2019-04-08: 50 mg via INTRAVENOUS

## 2019-04-08 NOTE — Patient Instructions (Signed)
[facility] Discharge Instructions for Patients Receiving Chemotherapy  Today you received the following chemotherapy agent: Taxol  To help prevent nausea and vomiting after your treatment, we encourage you to take your nausea medication as directed by your MD.   If you develop nausea and vomiting that is not controlled by your nausea medication, call the clinic.   BELOW ARE SYMPTOMS THAT SHOULD BE REPORTED IMMEDIATELY:  *FEVER GREATER THAN 100.5 F  *CHILLS WITH OR WITHOUT FEVER  NAUSEA AND VOMITING THAT IS NOT CONTROLLED WITH YOUR NAUSEA MEDICATION  *UNUSUAL SHORTNESS OF BREATH  *UNUSUAL BRUISING OR BLEEDING  TENDERNESS IN MOUTH AND THROAT WITH OR WITHOUT PRESENCE OF ULCERS  *URINARY PROBLEMS  *BOWEL PROBLEMS  UNUSUAL RASH Items with * indicate a potential emergency and should be followed up as soon as possible.  Feel free to call the clinic should you have any questions or concerns. The clinic phone number is [phone].  Please show the CHEMO ALERT CARD at check-in to the Emergency Department and triage nurse. Coronavirus (COVID-19) Are you at risk?  Are you at risk for the Coronavirus (COVID-19)?  To be considered HIGH RISK for Coronavirus (COVID-19), you have to meet the following criteria:  . Traveled to [place], [place], [place], [place] or [place]; or in the [place] to [place], [place], [place], or [place]; and have fever, cough, and shortness of breath within the last 2 weeks of travel OR . Been in close contact with a person diagnosed with COVID-19 within the last 2 weeks and have fever, cough, and shortness of breath . IF YOU DO NOT MEET THESE CRITERIA, YOU ARE CONSIDERED LOW RISK FOR COVID-19.  What to do if you are HIGH RISK for COVID-19?  . If you are having a medical emergency, call 911. . Seek medical care right away. Before you go to a doctor's office, urgent care or emergency department, call ahead and tell them about your  recent travel, contact with someone diagnosed with COVID-19, and your symptoms. You should receive instructions from your physician's office regarding next steps of care.  . When you arrive at healthcare provider, tell the healthcare staff immediately you have returned from visiting [place], [place], [place], [place] or [place]; or traveled in the [place] to [place], [place], [place], or [place]; in the last two weeks or you have been in close contact with a person diagnosed with COVID-19 in the last 2 weeks.   . Tell the health care staff about your symptoms: fever, cough and shortness of breath. . After you have been seen by a medical provider, you will be either: o Tested for (COVID-19) and discharged home on quarantine except to seek medical care if symptoms worsen, and asked to  - Stay home and avoid contact with others until you get your results (4-5 days)  - Avoid travel on public transportation if possible (such as bus, train, or airplane) or o Sent to the Emergency Department by EMS for evaluation, COVID-19 testing, and possible admission depending on your condition and test results.  What to do if you are LOW RISK for COVID-19?  Reduce your risk of any infection by using the same precautions used for avoiding the common cold or flu:  . Wash your hands often with soap and warm water for at least 20 seconds.  If soap and water are not readily available, use an alcohol-based hand sanitizer with at least 60% alcohol.  . If coughing   or sneezing, cover your mouth and nose by coughing or sneezing into the elbow areas of your shirt or coat, into a tissue or into your sleeve (not your hands). . Avoid shaking hands with others and consider head nods or verbal greetings only. . Avoid touching your eyes, nose, or mouth with unwashed hands.  . Avoid close contact with people who are sick. . Avoid places or events with large numbers of people in one location, like concerts or sporting  events. . Carefully consider travel plans you have or are making. . If you are planning any travel outside or inside the [place], visit the CDC's Travelers' Health webpage for the latest health notices. . If you have some symptoms but not all symptoms, continue to monitor at home and seek medical attention if your symptoms worsen. . If you are having a medical emergency, call 911.   ADDITIONAL HEALTHCARE OPTIONS FOR PATIENTS  [facility] / e-Visit: [email or web address]         [facility]: [phone]  [facility]: [phone]                   [facility]: [phone]    

## 2019-04-15 ENCOUNTER — Inpatient Hospital Stay: Payer: BC Managed Care – PPO

## 2019-04-15 ENCOUNTER — Other Ambulatory Visit: Payer: Self-pay

## 2019-04-15 VITALS — BP 122/78 | HR 81 | Temp 98.7°F | Resp 16 | Ht 66.0 in | Wt 106.8 lb

## 2019-04-15 DIAGNOSIS — C50412 Malignant neoplasm of upper-outer quadrant of left female breast: Secondary | ICD-10-CM

## 2019-04-15 DIAGNOSIS — Z5111 Encounter for antineoplastic chemotherapy: Secondary | ICD-10-CM | POA: Diagnosis not present

## 2019-04-15 DIAGNOSIS — Z95828 Presence of other vascular implants and grafts: Secondary | ICD-10-CM

## 2019-04-15 DIAGNOSIS — Z170 Estrogen receptor positive status [ER+]: Secondary | ICD-10-CM

## 2019-04-15 LAB — CBC WITH DIFFERENTIAL (CANCER CENTER ONLY)
Abs Immature Granulocytes: 0.07 10*3/uL (ref 0.00–0.07)
Basophils Absolute: 0 10*3/uL (ref 0.0–0.1)
Basophils Relative: 0 %
Eosinophils Absolute: 0 10*3/uL (ref 0.0–0.5)
Eosinophils Relative: 1 %
HCT: 31.8 % — ABNORMAL LOW (ref 36.0–46.0)
Hemoglobin: 9.5 g/dL — ABNORMAL LOW (ref 12.0–15.0)
Immature Granulocytes: 1 %
Lymphocytes Relative: 10 %
Lymphs Abs: 0.6 10*3/uL — ABNORMAL LOW (ref 0.7–4.0)
MCH: 26.5 pg (ref 26.0–34.0)
MCHC: 29.9 g/dL — ABNORMAL LOW (ref 30.0–36.0)
MCV: 88.6 fL (ref 80.0–100.0)
Monocytes Absolute: 0.4 10*3/uL (ref 0.1–1.0)
Monocytes Relative: 7 %
Neutro Abs: 4.8 10*3/uL (ref 1.7–7.7)
Neutrophils Relative %: 81 %
Platelet Count: 320 10*3/uL (ref 150–400)
RBC: 3.59 MIL/uL — ABNORMAL LOW (ref 3.87–5.11)
RDW: 18.9 % — ABNORMAL HIGH (ref 11.5–15.5)
WBC Count: 5.8 10*3/uL (ref 4.0–10.5)
nRBC: 0 % (ref 0.0–0.2)

## 2019-04-15 LAB — CMP (CANCER CENTER ONLY)
ALT: 30 U/L (ref 0–44)
AST: 41 U/L (ref 15–41)
Albumin: 3.7 g/dL (ref 3.5–5.0)
Alkaline Phosphatase: 62 U/L (ref 38–126)
Anion gap: 8 (ref 5–15)
BUN: 12 mg/dL (ref 6–20)
CO2: 24 mmol/L (ref 22–32)
Calcium: 9 mg/dL (ref 8.9–10.3)
Chloride: 109 mmol/L (ref 98–111)
Creatinine: 0.7 mg/dL (ref 0.44–1.00)
GFR, Est AFR Am: >60 mL/min (ref >60)
GFR, Estimated: >60 mL/min (ref >60)
Glucose, Bld: 88 mg/dL (ref 70–99)
Potassium: 4 mmol/L (ref 3.5–5.1)
Sodium: 141 mmol/L (ref 135–145)
Total Bilirubin: 0.3 mg/dL (ref 0.3–1.2)
Total Protein: 6 g/dL — ABNORMAL LOW (ref 6.5–8.1)

## 2019-04-15 MED ORDER — HEPARIN SOD (PORK) LOCK FLUSH 100 UNIT/ML IV SOLN
500.0000 [IU] | Freq: Once | INTRAVENOUS | Status: AC | PRN
  Administered 2019-04-15: 500 [IU]
  Filled 2019-04-15: qty 5

## 2019-04-15 MED ORDER — SODIUM CHLORIDE 0.9% FLUSH
10.0000 mL | INTRAVENOUS | Status: DC | PRN
  Administered 2019-04-15: 10 mL
  Filled 2019-04-15: qty 10

## 2019-04-15 MED ORDER — DIPHENHYDRAMINE HCL 50 MG/ML IJ SOLN
INTRAMUSCULAR | Status: AC
  Filled 2019-04-15: qty 1

## 2019-04-15 MED ORDER — SODIUM CHLORIDE 0.9 % IV SOLN
Freq: Once | INTRAVENOUS | Status: AC
  Administered 2019-04-15: 10:00:00 via INTRAVENOUS
  Filled 2019-04-15: qty 250

## 2019-04-15 MED ORDER — SODIUM CHLORIDE 0.9 % IV SOLN
20.0000 mg | Freq: Once | INTRAVENOUS | Status: AC
  Administered 2019-04-15: 10:00:00 20 mg via INTRAVENOUS
  Filled 2019-04-15: qty 20

## 2019-04-15 MED ORDER — SODIUM CHLORIDE 0.9 % IV SOLN
60.0000 mg/m2 | Freq: Once | INTRAVENOUS | Status: AC
  Administered 2019-04-15: 90 mg via INTRAVENOUS
  Filled 2019-04-15: qty 15

## 2019-04-15 MED ORDER — DIPHENHYDRAMINE HCL 50 MG/ML IJ SOLN
50.0000 mg | Freq: Once | INTRAMUSCULAR | Status: AC
  Administered 2019-04-15: 50 mg via INTRAVENOUS

## 2019-04-15 MED ORDER — FAMOTIDINE IN NACL 20-0.9 MG/50ML-% IV SOLN
INTRAVENOUS | Status: AC
  Filled 2019-04-15: qty 50

## 2019-04-15 MED ORDER — FAMOTIDINE IN NACL 20-0.9 MG/50ML-% IV SOLN
20.0000 mg | Freq: Once | INTRAVENOUS | Status: AC
  Administered 2019-04-15: 20 mg via INTRAVENOUS

## 2019-04-15 NOTE — Patient Instructions (Signed)

## 2019-04-15 NOTE — Patient Instructions (Signed)
[facility] Discharge Instructions for Patients Receiving Chemotherapy  Today you received the following chemotherapy agents: Paclitaxel (Taxol)  To help prevent nausea and vomiting after your treatment, we encourage you to take your nausea medication as directed.    If you develop nausea and vomiting that is not controlled by your nausea medication, call the clinic.   BELOW ARE SYMPTOMS THAT SHOULD BE REPORTED IMMEDIATELY:  *FEVER GREATER THAN 100.5 F  *CHILLS WITH OR WITHOUT FEVER  NAUSEA AND VOMITING THAT IS NOT CONTROLLED WITH YOUR NAUSEA MEDICATION  *UNUSUAL SHORTNESS OF BREATH  *UNUSUAL BRUISING OR BLEEDING  TENDERNESS IN MOUTH AND THROAT WITH OR WITHOUT PRESENCE OF ULCERS  *URINARY PROBLEMS  *BOWEL PROBLEMS  UNUSUAL RASH Items with * indicate a potential emergency and should be followed up as soon as possible.  Feel free to call the clinic should you have any questions or concerns. The clinic phone number is [phone].  Please show the CHEMO ALERT CARD at check-in to the Emergency Department and triage nurse.  Coronavirus (COVID-19) Are you at risk?  Are you at risk for the Coronavirus (COVID-19)?  To be considered HIGH RISK for Coronavirus (COVID-19), you have to meet the following criteria:  . Traveled to [place], [place], [place], [place] or [place]; or in the [place] to [place], [place], [place], or [place]; and have fever, cough, and shortness of breath within the last 2 weeks of travel OR . Been in close contact with a person diagnosed with COVID-19 within the last 2 weeks and have fever, cough, and shortness of breath . IF YOU DO NOT MEET THESE CRITERIA, YOU ARE CONSIDERED LOW RISK FOR COVID-19.  What to do if you are HIGH RISK for COVID-19?  . If you are having a medical emergency, call 911. . Seek medical care right away. Before you go to a doctor's office, urgent care or emergency department, call ahead and tell them about  your recent travel, contact with someone diagnosed with COVID-19, and your symptoms. You should receive instructions from your physician's office regarding next steps of care.  . When you arrive at healthcare provider, tell the healthcare staff immediately you have returned from visiting [place], [place], [place], [place] or [place]; or traveled in the [place] to [place], [place], [place], or [place]; in the last two weeks or you have been in close contact with a person diagnosed with COVID-19 in the last 2 weeks.   . Tell the health care staff about your symptoms: fever, cough and shortness of breath. . After you have been seen by a medical provider, you will be either: o Tested for (COVID-19) and discharged home on quarantine except to seek medical care if symptoms worsen, and asked to  - Stay home and avoid contact with others until you get your results (4-5 days)  - Avoid travel on public transportation if possible (such as bus, train, or airplane) or o Sent to the Emergency Department by EMS for evaluation, COVID-19 testing, and possible admission depending on your condition and test results.  What to do if you are LOW RISK for COVID-19?  Reduce your risk of any infection by using the same precautions used for avoiding the common cold or flu:  . Wash your hands often with soap and warm water for at least 20 seconds.  If soap and water are not readily available, use an alcohol-based hand sanitizer with at least 60% alcohol.  . If coughing   or sneezing, cover your mouth and nose by coughing or sneezing into the elbow areas of your shirt or coat, into a tissue or into your sleeve (not your hands). . Avoid shaking hands with others and consider head nods or verbal greetings only. . Avoid touching your eyes, nose, or mouth with unwashed hands.  . Avoid close contact with people who are sick. . Avoid places or events with large numbers of people in one location, like concerts or sporting  events. . Carefully consider travel plans you have or are making. . If you are planning any travel outside or inside the [place], visit the CDC's Travelers' Health webpage for the latest health notices. . If you have some symptoms but not all symptoms, continue to monitor at home and seek medical attention if your symptoms worsen. . If you are having a medical emergency, call 911.   ADDITIONAL HEALTHCARE OPTIONS FOR PATIENTS  [facility] / e-Visit: [email or web address]         [facility]: [phone]  [facility]: [phone]                   [facility]: [phone]   

## 2019-04-15 NOTE — Assessment & Plan Note (Signed)
11/13/2018:Left mastectomy: Grade 2 IDC, 1.6 cm, with intermediate grade DCIS, margins negative, lymphovascular invasion present, 1/7 lymph nodes positive, MammaPrint high risk, ER 100%, PR 30%, HER-2 negative, Ki-67 30% T1CN1A stage Ia  Treatment plan: 1.Adjuvant chemotherapy with dose since he remains on Cytoxan x4 followed by Taxol weekly x12 2.Followed by adjuvant radiation therapy 3.Followed by adjuvant antiestrogen therapy ------------------------------------------------------------------------------------------------------------------ Current Treatment:Completed 4 cycles of dose dense Adriamycin and Cytoxan. Today is cycle10of Taxol  Labs are reviewed  Chemo toxicities: 1.Mild nausea lasts 2-3 days after chemo with loss of taste 2.Vaginal dryness 3. Chemo induced anemia: Monitoring closely. No change in treatment yet.Hemoglobin is down to 9.6today.She had normal hemoglobin prior to starting chemotherapy. 4.  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  I will reduce the dosage of Taxol today. 5.  Mild elevation of AST and ALT: Okay to continue the treatment.  We will watch these levels closely.  Weight loss:Finally stabilized. I discussed with her that she will definitely need radiation at the end of chemotherapy. Return to clinic in 2 weeks to receive her last and final cycle of chemotherapy.

## 2019-04-19 NOTE — Progress Notes (Signed)
Patient Care Team: Emeterio Reeve, DO as PCP - General (Osteopathic Medicine) Rolm Bookbinder, MD as Consulting Physician (General Surgery) Nicholas Lose, MD as Consulting Physician (Hematology and Oncology) Eppie Gibson, MD as Attending Physician (Radiation Oncology)  DIAGNOSIS:    ICD-10-CM   1. Malignant neoplasm of upper-outer quadrant of left breast in female, estrogen receptor positive ([place])  C50.412    Z17.0     SUMMARY OF ONCOLOGIC HISTORY: Oncology History  Malignant neoplasm of upper-outer quadrant of left breast in female, estrogen receptor positive ([place])  09/12/2018 Initial Diagnosis   Palpable left breast mass at 2:30 position by ultrasound measured 1.6 cm, calcifications spanning 5.9 cm, 3 abnormal lymph nodes, multiple masses and calcifications biopsy of the mass revealed grade 2 IDC plus DCIS with lymphovascular invasion, ER 100%, PR 60%, Ki-67 30%, HER-2 negative IHC 1+, pathology of calcifications is pending, biopsy of lymph nodes is discordant benign, T1cN0 stage Ia   09/19/2018 Cancer Staging   Staging form: Breast, AJCC 8th Edition - Clinical stage from 09/19/2018: Stage IA (cT1c(m), cN0, cM0, G2, ER+, PR+, HER2-) - Signed by Nicholas Lose, MD on 09/19/2018   11/13/2018 Surgery   Left mastectomy: Grade 2 IDC, 1.6 cm, with intermediate grade DCIS, margins negative, lymphovascular invasion present, 1/7 lymph nodes positive, MammaPrint high risk, ER 100%, PR 30%, HER-2 negative, Ki-67 30% T1CN1A stage Ia   11/13/2018 Cancer Staging   Staging form: Breast, AJCC 8th Edition - Pathologic stage from 11/13/2018: Stage IA (pT1c, pN1a, cM0, G2, ER+, PR+, HER2-) - Signed by Gardenia Phlegm, NP on 11/28/2018   12/17/2018 -  Neo-Adjuvant Chemotherapy   Dose dense Adriamycin and Cytoxan followed by Taxol and carboplatin     CHIEF COMPLIANT: Cycle 10 Taxol  INTERVAL HISTORY: Heather [name] is a 58 y.o. with above-mentioned history of left breast cancer  treated with mastectomy whocompleted 4 cycles ofadjuvant chemotherapy with dose dense Adriamycin and Cytoxan and is currently receiving weekly Taxol.She presents to the clinicalone today for cycle10. Because of neuropathy, fatigue, weight loss, exhaustion from chemotherapy, today will be her last cycle.  REVIEW OF SYSTEMS:   Constitutional: Denies fevers, chills or abnormal weight loss Eyes: Denies blurriness of vision Ears, nose, mouth, throat, and face: Denies mucositis or sore throat Respiratory: Denies cough, dyspnea or wheezes Cardiovascular: Denies palpitation, chest discomfort Gastrointestinal: Denies nausea, heartburn or change in bowel habits Skin: Denies abnormal skin rashes Lymphatics: Denies new lymphadenopathy or easy bruising Neurological: Neuropathy in the feet Behavioral/Psych: Mood is stable, no new changes  Extremities: No lower extremity edema Breast: denies any pain or lumps or nodules in either breasts All other systems were reviewed with the patient and are negative.  I have reviewed the past medical history, past surgical history, social history and family history with the patient and they are unchanged from previous note.  ALLERGIES:  has No Known Allergies.  MEDICATIONS:  Current Outpatient Medications  Medication Sig Dispense Refill  . lidocaine-prilocaine (EMLA) cream Apply 1 application topically as needed. 30 g 0  . lisinopril-hydrochlorothiazide (PRINZIDE,ZESTORETIC) 10-12.5 MG tablet TAKE 1 TABLET BY MOUTH EVERY DAY 90 tablet 0  . LORazepam (ATIVAN) 0.5 MG tablet Take 1 tablet (0.5 mg total) by mouth at bedtime as needed for sleep. 30 tablet 0  . ondansetron (ZOFRAN) 8 MG tablet Take 1 tablet (8 mg total) by mouth 2 (two) times daily as needed. Start on the third day after chemotherapy. (Patient not taking: Reported on 12/04/2018) 30 tablet 1  . prochlorperazine (  COMPAZINE) 10 MG tablet Take 1 tablet (10 mg total) by mouth every 6 (six) hours as needed  (Nausea or vomiting). (Patient not taking: Reported on 12/04/2018) 30 tablet 1   No current facility-administered medications for this visit.    Facility-Administered Medications Ordered in Other Visits  Medication Dose Route Frequency Provider Last Rate Last Dose  . sodium chloride flush (NS) 0.9 % injection 10 mL  10 mL Intravenous PRN Nicholas Lose, MD   10 mL at 12/17/18 0907    PHYSICAL EXAMINATION: ECOG PERFORMANCE STATUS: 1 - Symptomatic but completely ambulatory  Vitals:   04/22/19 0849  BP: 133/68  Pulse: 91  Resp: 18  Temp: 98.5 F (36.9 C)  SpO2: 100%   Filed Weights   04/22/19 0849  Weight: 108 lb 1.6 oz (49 kg)    GENERAL: alert, no distress and comfortable SKIN: skin color, texture, turgor are normal, no rashes or significant lesions EYES: normal, Conjunctiva are pink and non-injected, sclera clear OROPHARYNX: no exudate, no erythema and lips, buccal mucosa, and tongue normal  NECK: supple, thyroid normal size, non-tender, without nodularity LYMPH: no palpable lymphadenopathy in the cervical, axillary or inguinal LUNGS: clear to auscultation and percussion with normal breathing effort HEART: regular rate & rhythm and no murmurs and no lower extremity edema ABDOMEN: abdomen soft, non-tender and normal bowel sounds MUSCULOSKELETAL: no cyanosis of digits and no clubbing  NEURO: alert & oriented x 3 with fluent speech, no focal motor/sensory deficits EXTREMITIES: No lower extremity edema  LABORATORY DATA:  I have reviewed the data as listed CMP Latest Ref Rng & Units 04/15/2019 04/08/2019 04/01/2019  Glucose 70 - 99 mg/dL 88 107(H) 109(H)  BUN 6 - 20 mg/dL _0 Creatinine 0.44 - 1.00 mg/dL 0.70 0.76 0.70  Sodium 135 - 145 mmol/L 141 139 141  Potassium 3.5 - 5.1 mmol/L 4.0 3.9 3.5  Chloride 98 - 111 mmol/L 109 105 106  CO2 22 - 32 mmol/L _1 Calcium 8.9 - 10.3 mg/dL 9.0 9.0 8.6(L)  Total Protein 6.5 - 8.1 g/dL 6.0(L) 6.4(L) 6.0(L)  Total Bilirubin 0.3  - 1.2 mg/dL 0.3 0.3 0.2(L)  Alkaline Phos 38 - 126 U/L 62 65 69  AST 15 - 41 U/L 41 49(H) 63(H)  ALT 0 - 44 U/L 30 50(H) 59(H)    Lab Results  Component Value Date   WBC 7.3 04/22/2019   HGB 10.3 (L) 04/22/2019   HCT 33.6 (L) 04/22/2019   MCV 87.5 04/22/2019   PLT 314 04/22/2019   NEUTROABS 5.9 04/22/2019    ASSESSMENT & PLAN:  Malignant neoplasm of upper-outer quadrant of left breast in female, estrogen receptor positive ([place]) 11/13/2018:Left mastectomy: Grade 2 IDC, 1.6 cm, with intermediate grade DCIS, margins negative, lymphovascular invasion present, 1/7 lymph nodes positive, MammaPrint high risk, ER 100%, PR 30%, HER-2 negative, Ki-67 30% T1CN1A stage Ia  Treatment plan: 1.Adjuvant chemotherapy with dose since he remains on Cytoxan x4 followed by Taxol weekly x12 2.Followed by adjuvant radiation therapy 3.Followed by adjuvant antiestrogen therapy ------------------------------------------------------------------------------------------------------------------ Current Treatment:Completed 4 cycles of dose dense Adriamycin and Cytoxan. Today is cycle10of Taxol  Labs are reviewed  Chemo toxicities: 1.Mild nausea lasts 2-3 days after chemo with loss of taste 2.Vaginal dryness 3. Chemo induced anemia: Monitoring closely.  Her hemoglobin today is 10.3 4.  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    5.  Mild elevation of AST and ALT: Okay to continue the treatment.  We will  watch these levels closely.  Weight loss:Finally stabilized.  After much discussion, we decided to discontinue further chemotherapy after today's treatment. I discussed with her that she will definitely need radiation at the end of chemotherapy. Return to clinic after radiation is complete to begin antiestrogen therapy.  No orders of the defined types were placed in this encounter.  The patient has a good understanding of the overall plan. she agrees with it. she will  call with any problems that may develop before the next visit here.  Nicholas Lose, MD 04/22/2019  Julious Oka Dorshimer am acting as scribe for Dr. Nicholas Lose.  I have reviewed the above documentation for accuracy and completeness, and I agree with the above.

## 2019-04-22 ENCOUNTER — Other Ambulatory Visit: Payer: Self-pay

## 2019-04-22 ENCOUNTER — Telehealth: Payer: Self-pay | Admitting: Hematology and Oncology

## 2019-04-22 ENCOUNTER — Inpatient Hospital Stay: Payer: BC Managed Care – PPO

## 2019-04-22 ENCOUNTER — Other Ambulatory Visit: Payer: Self-pay | Admitting: *Deleted

## 2019-04-22 ENCOUNTER — Inpatient Hospital Stay (HOSPITAL_BASED_OUTPATIENT_CLINIC_OR_DEPARTMENT_OTHER): Payer: BC Managed Care – PPO | Admitting: Hematology and Oncology

## 2019-04-22 ENCOUNTER — Telehealth: Payer: Self-pay | Admitting: Radiation Oncology

## 2019-04-22 DIAGNOSIS — R634 Abnormal weight loss: Secondary | ICD-10-CM

## 2019-04-22 DIAGNOSIS — Z5111 Encounter for antineoplastic chemotherapy: Secondary | ICD-10-CM | POA: Diagnosis not present

## 2019-04-22 DIAGNOSIS — C50412 Malignant neoplasm of upper-outer quadrant of left female breast: Secondary | ICD-10-CM

## 2019-04-22 DIAGNOSIS — D6481 Anemia due to antineoplastic chemotherapy: Secondary | ICD-10-CM

## 2019-04-22 DIAGNOSIS — G620 Drug-induced polyneuropathy: Secondary | ICD-10-CM | POA: Diagnosis not present

## 2019-04-22 DIAGNOSIS — Z79899 Other long term (current) drug therapy: Secondary | ICD-10-CM

## 2019-04-22 DIAGNOSIS — Z95828 Presence of other vascular implants and grafts: Secondary | ICD-10-CM

## 2019-04-22 DIAGNOSIS — Z170 Estrogen receptor positive status [ER+]: Secondary | ICD-10-CM | POA: Diagnosis not present

## 2019-04-22 LAB — CMP (CANCER CENTER ONLY)
ALT: 31 U/L (ref 0–44)
AST: 37 U/L (ref 15–41)
Albumin: 3.8 g/dL (ref 3.5–5.0)
Alkaline Phosphatase: 64 U/L (ref 38–126)
Anion gap: 9 (ref 5–15)
BUN: 12 mg/dL (ref 6–20)
CO2: 26 mmol/L (ref 22–32)
Calcium: 8.8 mg/dL — ABNORMAL LOW (ref 8.9–10.3)
Chloride: 107 mmol/L (ref 98–111)
Creatinine: 0.85 mg/dL (ref 0.44–1.00)
GFR, Est AFR Am: >60 mL/min (ref >60)
GFR, Estimated: >60 mL/min (ref >60)
Glucose, Bld: 109 mg/dL — ABNORMAL HIGH (ref 70–99)
Potassium: 3.6 mmol/L (ref 3.5–5.1)
Sodium: 142 mmol/L (ref 135–145)
Total Bilirubin: 0.3 mg/dL (ref 0.3–1.2)
Total Protein: 6 g/dL — ABNORMAL LOW (ref 6.5–8.1)

## 2019-04-22 LAB — CBC WITH DIFFERENTIAL (CANCER CENTER ONLY)
Abs Immature Granulocytes: 0.06 10*3/uL (ref 0.00–0.07)
Basophils Absolute: 0 10*3/uL (ref 0.0–0.1)
Basophils Relative: 1 %
Eosinophils Absolute: 0 10*3/uL (ref 0.0–0.5)
Eosinophils Relative: 1 %
HCT: 33.6 % — ABNORMAL LOW (ref 36.0–46.0)
Hemoglobin: 10.3 g/dL — ABNORMAL LOW (ref 12.0–15.0)
Immature Granulocytes: 1 %
Lymphocytes Relative: 9 %
Lymphs Abs: 0.6 10*3/uL — ABNORMAL LOW (ref 0.7–4.0)
MCH: 26.8 pg (ref 26.0–34.0)
MCHC: 30.7 g/dL (ref 30.0–36.0)
MCV: 87.5 fL (ref 80.0–100.0)
Monocytes Absolute: 0.6 10*3/uL (ref 0.1–1.0)
Monocytes Relative: 8 %
Neutro Abs: 5.9 10*3/uL (ref 1.7–7.7)
Neutrophils Relative %: 80 %
Platelet Count: 314 10*3/uL (ref 150–400)
RBC: 3.84 MIL/uL — ABNORMAL LOW (ref 3.87–5.11)
RDW: 19 % — ABNORMAL HIGH (ref 11.5–15.5)
WBC Count: 7.3 10*3/uL (ref 4.0–10.5)
nRBC: 0 % (ref 0.0–0.2)

## 2019-04-22 MED ORDER — SODIUM CHLORIDE 0.9 % IV SOLN
Freq: Once | INTRAVENOUS | Status: AC
  Administered 2019-04-22: 10:00:00 via INTRAVENOUS
  Filled 2019-04-22: qty 250

## 2019-04-22 MED ORDER — HEPARIN SOD (PORK) LOCK FLUSH 100 UNIT/ML IV SOLN
500.0000 [IU] | Freq: Once | INTRAVENOUS | Status: AC | PRN
  Administered 2019-04-22: 500 [IU]
  Filled 2019-04-22: qty 5

## 2019-04-22 MED ORDER — SODIUM CHLORIDE 0.9% FLUSH
10.0000 mL | INTRAVENOUS | Status: DC | PRN
  Administered 2019-04-22: 10 mL
  Filled 2019-04-22: qty 10

## 2019-04-22 MED ORDER — SODIUM CHLORIDE 0.9 % IV SOLN
20.0000 mg | Freq: Once | INTRAVENOUS | Status: AC
  Administered 2019-04-22: 10:00:00 20 mg via INTRAVENOUS
  Filled 2019-04-22: qty 20

## 2019-04-22 MED ORDER — DIPHENHYDRAMINE HCL 50 MG/ML IJ SOLN
50.0000 mg | Freq: Once | INTRAMUSCULAR | Status: AC
  Administered 2019-04-22: 50 mg via INTRAVENOUS

## 2019-04-22 MED ORDER — DIPHENHYDRAMINE HCL 50 MG/ML IJ SOLN
INTRAMUSCULAR | Status: AC
  Filled 2019-04-22: qty 1

## 2019-04-22 MED ORDER — FAMOTIDINE IN NACL 20-0.9 MG/50ML-% IV SOLN
INTRAVENOUS | Status: AC
  Filled 2019-04-22: qty 50

## 2019-04-22 MED ORDER — FAMOTIDINE IN NACL 20-0.9 MG/50ML-% IV SOLN
20.0000 mg | Freq: Once | INTRAVENOUS | Status: AC
  Administered 2019-04-22: 20 mg via INTRAVENOUS

## 2019-04-22 MED ORDER — SODIUM CHLORIDE 0.9% FLUSH
10.0000 mL | INTRAVENOUS | Status: DC | PRN
  Administered 2019-04-22: 09:00:00 10 mL
  Filled 2019-04-22: qty 10

## 2019-04-22 MED ORDER — SODIUM CHLORIDE 0.9 % IV SOLN
60.0000 mg/m2 | Freq: Once | INTRAVENOUS | Status: AC
  Administered 2019-04-22: 11:00:00 90 mg via INTRAVENOUS
  Filled 2019-04-22: qty 15

## 2019-04-22 NOTE — Patient Instructions (Signed)
[facility] Discharge Instructions for Patients Receiving Chemotherapy  Today you received the following chemotherapy agents:  Taxol.  To help prevent nausea and vomiting after your treatment, we encourage you to take your nausea medication as directed.   If you develop nausea and vomiting that is not controlled by your nausea medication, call the clinic.   BELOW ARE SYMPTOMS THAT SHOULD BE REPORTED IMMEDIATELY:  *FEVER GREATER THAN 100.5 F  *CHILLS WITH OR WITHOUT FEVER  NAUSEA AND VOMITING THAT IS NOT CONTROLLED WITH YOUR NAUSEA MEDICATION  *UNUSUAL SHORTNESS OF BREATH  *UNUSUAL BRUISING OR BLEEDING  TENDERNESS IN MOUTH AND THROAT WITH OR WITHOUT PRESENCE OF ULCERS  *URINARY PROBLEMS  *BOWEL PROBLEMS  UNUSUAL RASH Items with * indicate a potential emergency and should be followed up as soon as possible.  Feel free to call the clinic should you have any questions or concerns. The clinic phone number is [phone].  Please show the CHEMO ALERT CARD at check-in to the Emergency Department and triage nurse.   

## 2019-04-22 NOTE — Telephone Encounter (Signed)
New message: ° ° °LVM for patient to return call to schedule from referral received. °

## 2019-04-22 NOTE — Telephone Encounter (Signed)
Per 7/20 los removed appointments but not sure when radiation is to schedule follow up

## 2019-04-29 ENCOUNTER — Other Ambulatory Visit: Payer: BC Managed Care – PPO

## 2019-04-29 ENCOUNTER — Ambulatory Visit: Payer: BC Managed Care – PPO

## 2019-04-29 ENCOUNTER — Telehealth: Payer: Self-pay | Admitting: Radiation Oncology

## 2019-04-29 ENCOUNTER — Telehealth: Payer: Self-pay | Admitting: *Deleted

## 2019-04-29 NOTE — Telephone Encounter (Signed)
error 

## 2019-04-29 NOTE — Telephone Encounter (Signed)
New Message:    LVM for patient to call back to schedule from referral received.

## 2019-05-01 ENCOUNTER — Telehealth: Payer: Self-pay | Admitting: Radiation Oncology

## 2019-05-01 NOTE — Telephone Encounter (Signed)
New Message:    LVM for patient's spouse to have patient call me to set up an appt from referral received.

## 2019-05-02 ENCOUNTER — Other Ambulatory Visit: Payer: Self-pay | Admitting: Osteopathic Medicine

## 2019-05-02 DIAGNOSIS — I10 Essential (primary) hypertension: Secondary | ICD-10-CM

## 2019-05-06 ENCOUNTER — Ambulatory Visit: Payer: BC Managed Care – PPO | Admitting: Hematology and Oncology

## 2019-05-06 ENCOUNTER — Other Ambulatory Visit: Payer: BC Managed Care – PPO

## 2019-05-06 ENCOUNTER — Ambulatory Visit: Payer: BC Managed Care – PPO

## 2019-05-07 ENCOUNTER — Telehealth: Payer: Self-pay | Admitting: *Deleted

## 2019-05-07 NOTE — Telephone Encounter (Signed)
Left message regarding appointment for xrt.  Looks like Dr. Pearlie Oyster office has left message to schedule patient.

## 2019-05-09 NOTE — Progress Notes (Signed)
Location of Breast Cancer: Left Breast  Histology per Pathology Report:  09/12/2018 Diagnosis 1. Breast, left, needle core biopsy, upper outer - INVASIVE DUCTAL CARCINOMA, GRADE 2. - DUCTAL CARCINOMA IN SITU, INTERMEDIATE NUCLEAR GRADE. - SEE NOTE. 2. Lymph node, needle/core biopsy, left axilla - LYMPH NODE, NEGATIVE FOR CARCINOMA.  Receptor Status: ER(100%), PR (60%), Her2-neu (NEG), Ki-(30%)  1217/20 Diagnosis Breast, left, needle core biopsy, anterior lower outer - DUCTAL CARCINOMA IN SITU.  Receptor Status: ER (100%), PR (70%)  11/13/18 Diagnosis 1. Breast, simple mastectomy, Left Nipple - INVASIVE DUCTAL CARCINOMA, NOTTINGHAM GRADE 2 OF 3, 1.6 CM - DUCTAL CARCINOMA IN SITU, INTERMEDIATE GRADE - CALCIFICATIONS ASSOCIATED WITH CARCINOMA - MARGINS UNINVOLVED BY CARCINOMA (LESS THAN 1 MM, ANTERIOR AND POSTERIOR MARGINS) - LYMPHOVASCULAR SPACE INVASION PRESENT - PREVIOUS BIOPSY SITE CHANGES PRESENT - SEE ONCOLOGY TABLE AND COMMENT BELOW 2. Lymph node, sentinel, biopsy, Left axillary - NO CARCINOMA IDENTIFIED IN ONE LYMPH NODE (0/1) 3. Lymph node, sentinel, biopsy, Left - NO CARCINOMA IDENTIFIED IN ONE LYMPH NODE (0/1) 4. Lymph node, sentinel, biopsy, Left - NO CARCINOMA IDENTIFIED IN ONE LYMPH NODE (0/1) 5. Lymph node, sentinel, biopsy, Left - NO CARCINOMA IDENTIFIED IN ONE LYMPH NODE (0/1) 6. Lymph node, sentinel, biopsy, Left - NO CARCINOMA IDENTIFIED IN ONE LYMPH NODE (0/1) 7. Lymph node, sentinel, biopsy, Left - NO CARCINOMA IDENTIFIED IN ONE LYMPH NODE (0/1) 8. Lymph node, sentinel, biopsy, Left - METASTATIC CARCINOMA INVOLVING ONE LYMPH NODE (1/1) 9. Nipple Biopsy, Left nipple - NO CARCINOMA IDENTIFIED   Did patient present with symptoms or was this found on screening mammography?: She self palpated the mass along her left lateral breast.   Past/Anticipated interventions by surgeon, if any: 11/13/18 Procedure: 1. Left nipple sparing mastectomy 2. Left deep  axillary sentinel node biopsy Surgeon: Dr Serita Grammes Asst: Dr Irene Limbo  11/13/18 PROCEDURE:  1. Left breast reconstruction with tissue expander 2. Acellular dermis (Alloderm) for breast reconstruction 300 cm2 SURGEON: Irene Limbo MD MBA   Past/Anticipated interventions by medical oncology, if any: Dr. Lindi Adie 04/22/19 ASSESSMENT & PLAN:  Malignant neoplasm of upper-outer quadrant of left breast in female, estrogen receptor positive ([place]) 11/13/2018:Left mastectomy: Grade 2 IDC, 1.6 cm, with intermediate grade DCIS, margins negative, lymphovascular invasion present, 1/7 lymph nodes positive, MammaPrint high risk, ER 100%, PR 30%, HER-2 negative, Ki-67 30% T1CN1A stage Ia Treatment plan: 1.Adjuvant chemotherapy with dose since he remains on Cytoxan x4 followed by Taxol weekly x12 2.Followed by adjuvant radiation therapy 3.Followed by adjuvant antiestrogen therapy ------------------------------------------------------------------------------------------------------------------ Current Treatment:Completed 4 cycles of dose dense Adriamycin and Cytoxan. Today is cycle10of Taxol  Labs are reviewed  Weight loss:Finally stabilized.  After much discussion, we decided to discontinue further chemotherapy after today's treatment. I discussed with her that she will definitely need radiation at the end of chemotherapy. Return to clinic after radiation is complete to begin antiestrogen therapy.  Nicholas Lose, MD 04/22/2019  Lymphedema issues, if any:  She denies. She reports good arm mobility  Pain issues, if any:  She denies.   SAFETY ISSUES:  Prior radiation? No  Pacemaker/ICD? No  Possible current pregnancy? No  Is the patient on methotrexate? No  Current Complaints / other details:      Iyan Flett, Stephani Police, RN 05/09/2019,8:56 AM

## 2019-05-14 ENCOUNTER — Ambulatory Visit
Admission: RE | Admit: 2019-05-14 | Discharge: 2019-05-14 | Disposition: A | Discharge status: Home / Self Care | Payer: BC Managed Care – PPO | Source: Ambulatory Visit | Attending: Radiation Oncology | Admitting: Radiation Oncology

## 2019-05-14 ENCOUNTER — Encounter: Payer: Self-pay | Admitting: Radiation Oncology

## 2019-05-14 ENCOUNTER — Other Ambulatory Visit: Payer: Self-pay

## 2019-05-14 DIAGNOSIS — Z170 Estrogen receptor positive status [ER+]: Secondary | ICD-10-CM | POA: Insufficient documentation

## 2019-05-14 DIAGNOSIS — C50412 Malignant neoplasm of upper-outer quadrant of left female breast: Secondary | ICD-10-CM | POA: Insufficient documentation

## 2019-05-14 DIAGNOSIS — Z510 Encounter for antineoplastic radiation therapy: Secondary | ICD-10-CM | POA: Insufficient documentation

## 2019-05-14 NOTE — Progress Notes (Signed)
Radiation Oncology         [phone] ________________________________  Name: Heather [name] MRN: [id number]  Date: 05/14/2019  DOB: 10-26-1960  Re-Evaluation Visit Note by phone due to pandemic precautions, patient could not access WebEx.  Outpatient  CC: Emeterio Reeve, DO  Nicholas Lose, MD  Diagnosis:      ICD-10-CM   1. Malignant neoplasm of upper-outer quadrant of left breast in female, estrogen receptor positive ([place])  C50.412    Z17.0      Cancer Staging Malignant neoplasm of upper-outer quadrant of left breast in female, estrogen receptor positive ([place]) Staging form: Breast, AJCC 8th Edition - Clinical stage from 09/19/2018: Stage IA (cT1c(m), cN0, cM0, G2, ER+, PR+, HER2-) - Signed by Nicholas Lose, MD on 09/19/2018 - Pathologic stage from 11/13/2018: Stage IA (pT1c, pN1a, cM0, G2, ER+, PR+, HER2-) - Signed by Gardenia Phlegm, NP on 11/28/2018  CHIEF COMPLAINT: Here to discuss management of left breast cancer  Narrative:  The patient returns today for follow-up.   Left nipple sparing mastectomy with left axillary sentinel lymph node biopsy on date of 11/13/2018 revealed: Invasive ductal carcinoma, measuring 1.6 cm, with intermediate grade DCIS and calcifications. Margins uninvolved by carcinoma (less than 1 mm, anterior and posterior margins). Lymphovascular space invasion present. Metastatic carcinoma involved one left lymph node (1/7). No carcinoma identified in left nipple.  ER status: 100%; PR status 30%, Her2 status negative; Grade 2. Patient also had left breast reconstruction with placement of tissue expander and AlloDerm.  Systemic therapy involved (dates and therapy as follows): 12/17/2018-04/22/2019: Neoadjuvant chemotherapy: Dose dense Adriamycin and Cytoxan followed by Taxol and carboplatin. Chemotherapy was discontinued because of neuropathy, fatigue, weight loss, and exhaustion. The patient has been referred today back for discussion of adjuvant  radiation treatment.   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  She is s/p tissue expander placement by Dr Iran Planas.        ALLERGIES:  has No Known Allergies.  Meds: Current Outpatient Medications  Medication Sig Dispense Refill  . lidocaine-prilocaine (EMLA) cream Apply 1 application topically as needed. 30 g 0  . lisinopril-hydrochlorothiazide (ZESTORETIC) 10-12.5 MG tablet TAKE 1 TABLET BY MOUTH EVERY DAY 90 tablet 0  . LORazepam (ATIVAN) 0.5 MG tablet Take 1 tablet (0.5 mg total) by mouth at bedtime as needed for sleep. (Patient not taking: Reported on 05/14/2019) 30 tablet 0  . ondansetron (ZOFRAN) 4 MG tablet TAKE 1 TABLET (4 MG TOTAL) BY MOUTH EVERY 6 (SIX) HOURS AS NEEDED FOR UP TO 5 DAYS    . ondansetron (ZOFRAN) 8 MG tablet Take 1 tablet (8 mg total) by mouth 2 (two) times daily as needed. Start on the third day after chemotherapy. (Patient not taking: Reported on 12/04/2018) 30 tablet 1  . prochlorperazine (COMPAZINE) 10 MG tablet Take 1 tablet (10 mg total) by mouth every 6 (six) hours as needed (Nausea or vomiting). (Patient not taking: Reported on 12/04/2018) 30 tablet 1   No current facility-administered medications for this encounter.    Facility-Administered Medications Ordered in Other Encounters  Medication Dose Route Frequency Provider Last Rate Last Dose  . sodium chloride flush (NS) 0.9 % injection 10 mL  10 mL Intravenous PRN Nicholas Lose, MD   10 mL at 12/17/18 0907    Physical Findings:  vitals were not taken for this visit. .    Gen: NAD Psychiatric: Judgment and insight are intact. Affect is appropriate.   Lab Findings:  Lab Results  Component Value Date   WBC 7.3 04/22/2019   HGB 10.3 (L) 04/22/2019   HCT 33.6 (L) 04/22/2019   MCV 87.5 04/22/2019   PLT 314 04/22/2019     Radiographic Findings: No results found.  Impression/Plan: Left Breast Cancer We discussed adjuvant radiotherapy today.  I recommend  radiotherapy to the left chest wall and regional nodes in order to reduce risk of locoregional recurrence by 2/3.  The risks, benefits and side effects of this treatment were discussed in detail.  She understands that radiotherapy is associated with skin irritation and fatigue in the acute setting. Late effects can include cosmetic changes, fibrosis,  lymphedema, and rare injury to internal organs.   She is enthusiastic about proceeding with treatment.   A total of 5 medically necessary complex treatment devices will be fabricated and supervised by me: 4 fields with MLCs for custom blocks to protect heart, and lungs;  and, a Vac-lok. MORE COMPLEX DEVICES MAY BE MADE IN DOSIMETRY FOR FIELD IN FIELD BEAMS FOR DOSE HOMOGENEITY.  I have requested : 3D Simulation which is medically necessary to give adequate dose to at risk tissues while sparing lungs and heart.  I have requested a DVH of the following structures: lungs, heart, esophagus.    The patient will receive 50.4 Gy in 28 fractions to the left chest wall and regional nodes with 4 fields.  This will be followed by a boost.  Plan week of 8-17 for simulation as she is out of town the week of 8-24 to visit her mother.  Start RT on 9-1.  This encounter was provided by telemedicine platform phone as she could not access WebEx. The patient has given verbal consent for this type of encounter and has been advised to only accept a meeting of this type in a secure network environment. The time spent during this encounter was 17 minutes. The attendants for this meeting include Eppie Gibson  and March Rummage.  During the encounter, Eppie Gibson was located at [facility] Radiation Oncology Department.  Heather [name] was located at work.   _____________________________________   Eppie Gibson, MD  This document serves as a record of services personally performed by Eppie Gibson, MD. It was created on her behalf by Rae Lips,  a trained medical scribe. The creation of this record is based on the scribe's personal observations and the provider's statements to them. This document has been checked and approved by the attending provider.

## 2019-05-22 ENCOUNTER — Ambulatory Visit
Admission: RE | Admit: 2019-05-22 | Discharge: 2019-05-22 | Disposition: A | Discharge status: Home / Self Care | Payer: BC Managed Care – PPO | Source: Ambulatory Visit | Attending: Radiation Oncology | Admitting: Radiation Oncology

## 2019-05-22 ENCOUNTER — Other Ambulatory Visit: Payer: Self-pay

## 2019-05-22 ENCOUNTER — Telehealth: Payer: Self-pay

## 2019-05-22 ENCOUNTER — Encounter: Payer: Self-pay | Admitting: Radiation Oncology

## 2019-05-22 DIAGNOSIS — Z170 Estrogen receptor positive status [ER+]: Secondary | ICD-10-CM | POA: Diagnosis not present

## 2019-05-22 DIAGNOSIS — C50412 Malignant neoplasm of upper-outer quadrant of left female breast: Secondary | ICD-10-CM

## 2019-05-22 DIAGNOSIS — Z510 Encounter for antineoplastic radiation therapy: Secondary | ICD-10-CM | POA: Diagnosis not present

## 2019-05-22 NOTE — Telephone Encounter (Signed)
RN spoke with patient regarding questions about port.  Pt has completed chemotherapy and will be starting radiation.  Pt reports she would like to keep port until radiation is complete.  RN scheduled patient for flush and sent message to Dr. Cristal Generous staff to contact for port removal after radiation. No further needs at this time.

## 2019-05-22 NOTE — Progress Notes (Signed)
Radiation Oncology         [phone] ________________________________  Name: Heather [name] MRN: [id number]  Date: 05/22/2019  DOB: May 27, 1961  SIMULATION AND TREATMENT PLANNING NOTE /  Special treatment procedure   Outpatient  DIAGNOSIS:     ICD-10-CM   1. Malignant neoplasm of upper-outer quadrant of left breast in female, estrogen receptor positive ([place])  C50.412    Z17.0     NARRATIVE:  The patient was brought to the [facility].  Identity was confirmed.  All relevant records and images related to the planned course of therapy were reviewed.  The patient freely provided informed written consent to proceed with treatment after reviewing the details related to the planned course of therapy. The consent form was witnessed and verified by the simulation staff.    Then, the patient was set-up in a stable reproducible supine position for radiation therapy with her ipsilateral arm over her head, and her upper body secured in a custom-made Vac-lok device.  CT images were obtained.  Surface markings were placed.  The CT images were loaded into the planning software.    Special treatment procedure:  Special treatment procedure was performed today due to the extra time and effort required by myself to plan and prepare this patient for deep inspiration breath hold technique.  I have determined cardiac sparing to be of benefit to this patient to prevent long term cardiac damage due to radiation of the heart.  Bellows were placed on the patient's abdomen. To facilitate cardiac sparing, the patient was coached by the radiation therapists on breath hold techniques and breathing practice was performed. Practice waveforms were obtained. The patient was then scanned while maintaining breath hold in the treatment position.  This image was then transferred over to the imaging specialist. The imaging specialist then created a fusion of the free breathing and breath hold scans using the  chest wall as the stable structure. I personally reviewed the fusion in axial, coronal and sagittal image planes.  Excellent cardiac sparing was obtained.  I felt the patient is an appropriate candidate for breath hold and the patient will be treated as such.  The image fusion was then reviewed with the patient to reinforce the necessity of reproducible breath hold.  TREATMENT PLANNING NOTE: Treatment planning then occurred.  The radiation prescription was entered and confirmed.     A total of 5 medically necessary complex treatment devices were fabricated and supervised by me: 4 fields with MLCs for custom blocks to protect heart, and lungs;  and, a Vac-lok. MORE COMPLEX DEVICES MAY BE MADE IN DOSIMETRY FOR FIELD IN FIELD BEAMS FOR DOSE HOMOGENEITY.  I have requested : 3D Simulation which is medically necessary to give adequate dose to at risk tissues while sparing lungs and heart.  I have requested a DVH of the following structures: lungs, heart, spinal cord, esophagus.    The patient will receive 50.4 Gy in 28 fractions to the left chest wall and regional nodes with 4 fields.  This will be followed by a boost.  Optical Surface Tracking Plan:  Since intensity modulated radiotherapy (IMRT) and 3D conformal radiation treatment methods are predicated on accurate and precise positioning for treatment, intrafraction motion monitoring is medically necessary to ensure accurate and safe treatment delivery. The ability to quantify intrafraction motion without excessive ionizing radiation dose can only be performed with optical surface tracking. Accordingly, surface imaging offers the opportunity to obtain 3D measurements of patient position throughout IMRT  and 3D treatments without excessive radiation exposure. I am ordering optical surface tracking for this patient's upcoming course of radiotherapy.  ________________________________   Reference:  Ursula Alert, J, et al. Surface imaging-based  analysis of intrafraction motion for breast radiotherapy patients.Journal of [place], n. 6, nov. 2014. ISSN [id number].  Available at: <http://www.jacmp.org/index.php/jacmp/article/view/4957>.    -----------------------------------  Eppie Gibson, MD

## 2019-05-23 ENCOUNTER — Other Ambulatory Visit: Payer: Self-pay | Admitting: General Surgery

## 2019-05-31 DIAGNOSIS — C50412 Malignant neoplasm of upper-outer quadrant of left female breast: Secondary | ICD-10-CM | POA: Diagnosis not present

## 2019-06-03 ENCOUNTER — Ambulatory Visit
Admission: RE | Admit: 2019-06-03 | Discharge: 2019-06-03 | Disposition: A | Discharge status: Home / Self Care | Payer: BC Managed Care – PPO | Source: Ambulatory Visit | Attending: Radiation Oncology | Admitting: Radiation Oncology

## 2019-06-03 ENCOUNTER — Other Ambulatory Visit: Payer: Self-pay

## 2019-06-04 ENCOUNTER — Ambulatory Visit: Payer: BC Managed Care – PPO | Admitting: Radiation Oncology

## 2019-06-05 ENCOUNTER — Other Ambulatory Visit: Payer: Self-pay

## 2019-06-05 ENCOUNTER — Ambulatory Visit: Payer: BC Managed Care – PPO

## 2019-06-05 ENCOUNTER — Ambulatory Visit
Admission: RE | Admit: 2019-06-05 | Discharge: 2019-06-05 | Disposition: A | Discharge status: Home / Self Care | Payer: BC Managed Care – PPO | Source: Ambulatory Visit | Attending: Radiation Oncology | Admitting: Radiation Oncology

## 2019-06-05 DIAGNOSIS — C50412 Malignant neoplasm of upper-outer quadrant of left female breast: Secondary | ICD-10-CM | POA: Insufficient documentation

## 2019-06-05 DIAGNOSIS — Z510 Encounter for antineoplastic radiation therapy: Secondary | ICD-10-CM | POA: Diagnosis not present

## 2019-06-05 DIAGNOSIS — Z170 Estrogen receptor positive status [ER+]: Secondary | ICD-10-CM | POA: Diagnosis not present

## 2019-06-06 ENCOUNTER — Ambulatory Visit: Payer: BC Managed Care – PPO

## 2019-06-06 ENCOUNTER — Other Ambulatory Visit: Payer: Self-pay

## 2019-06-06 ENCOUNTER — Ambulatory Visit
Admission: RE | Admit: 2019-06-06 | Discharge: 2019-06-06 | Disposition: A | Discharge status: Home / Self Care | Payer: BC Managed Care – PPO | Source: Ambulatory Visit | Attending: Radiation Oncology | Admitting: Radiation Oncology

## 2019-06-06 DIAGNOSIS — C50412 Malignant neoplasm of upper-outer quadrant of left female breast: Secondary | ICD-10-CM | POA: Diagnosis not present

## 2019-06-07 ENCOUNTER — Ambulatory Visit
Admission: RE | Admit: 2019-06-07 | Discharge: 2019-06-07 | Disposition: A | Discharge status: Home / Self Care | Payer: BC Managed Care – PPO | Source: Ambulatory Visit | Attending: Radiation Oncology | Admitting: Radiation Oncology

## 2019-06-07 ENCOUNTER — Other Ambulatory Visit: Payer: Self-pay

## 2019-06-07 DIAGNOSIS — C50412 Malignant neoplasm of upper-outer quadrant of left female breast: Secondary | ICD-10-CM | POA: Diagnosis not present

## 2019-06-11 ENCOUNTER — Ambulatory Visit
Admission: RE | Admit: 2019-06-11 | Discharge: 2019-06-11 | Disposition: A | Discharge status: Home / Self Care | Payer: BC Managed Care – PPO | Source: Ambulatory Visit | Attending: Radiation Oncology | Admitting: Radiation Oncology

## 2019-06-11 ENCOUNTER — Other Ambulatory Visit: Payer: Self-pay

## 2019-06-11 ENCOUNTER — Inpatient Hospital Stay: Payer: BC Managed Care – PPO | Attending: Genetic Counselor

## 2019-06-11 DIAGNOSIS — C50412 Malignant neoplasm of upper-outer quadrant of left female breast: Secondary | ICD-10-CM | POA: Diagnosis not present

## 2019-06-11 DIAGNOSIS — Z170 Estrogen receptor positive status [ER+]: Secondary | ICD-10-CM

## 2019-06-11 MED ORDER — RADIAPLEXRX EX GEL
Freq: Once | CUTANEOUS | Status: AC
  Administered 2019-06-11: 10:00:00 via TOPICAL

## 2019-06-11 MED ORDER — ALRA NON-METALLIC DEODORANT (RAD-ONC)
1.0000 "application " | Freq: Once | TOPICAL | Status: AC
  Administered 2019-06-11: 1 via TOPICAL

## 2019-06-12 ENCOUNTER — Other Ambulatory Visit: Payer: Self-pay

## 2019-06-12 ENCOUNTER — Ambulatory Visit
Admission: RE | Admit: 2019-06-12 | Discharge: 2019-06-12 | Disposition: A | Discharge status: Home / Self Care | Payer: BC Managed Care – PPO | Source: Ambulatory Visit | Attending: Radiation Oncology | Admitting: Radiation Oncology

## 2019-06-12 DIAGNOSIS — C50412 Malignant neoplasm of upper-outer quadrant of left female breast: Secondary | ICD-10-CM | POA: Diagnosis not present

## 2019-06-13 ENCOUNTER — Other Ambulatory Visit: Payer: Self-pay

## 2019-06-13 ENCOUNTER — Ambulatory Visit
Admission: RE | Admit: 2019-06-13 | Discharge: 2019-06-13 | Disposition: A | Discharge status: Home / Self Care | Payer: BC Managed Care – PPO | Source: Ambulatory Visit | Attending: Radiation Oncology | Admitting: Radiation Oncology

## 2019-06-13 DIAGNOSIS — C50412 Malignant neoplasm of upper-outer quadrant of left female breast: Secondary | ICD-10-CM | POA: Diagnosis not present

## 2019-06-14 ENCOUNTER — Ambulatory Visit
Admission: RE | Admit: 2019-06-14 | Discharge: 2019-06-14 | Disposition: A | Discharge status: Home / Self Care | Payer: BC Managed Care – PPO | Source: Ambulatory Visit | Attending: Radiation Oncology | Admitting: Radiation Oncology

## 2019-06-14 ENCOUNTER — Other Ambulatory Visit: Payer: Self-pay

## 2019-06-14 DIAGNOSIS — C50412 Malignant neoplasm of upper-outer quadrant of left female breast: Secondary | ICD-10-CM | POA: Diagnosis not present

## 2019-06-17 ENCOUNTER — Ambulatory Visit
Admission: RE | Admit: 2019-06-17 | Discharge: 2019-06-17 | Disposition: A | Discharge status: Home / Self Care | Payer: BC Managed Care – PPO | Source: Ambulatory Visit | Attending: Radiation Oncology | Admitting: Radiation Oncology

## 2019-06-17 ENCOUNTER — Other Ambulatory Visit: Payer: Self-pay

## 2019-06-17 DIAGNOSIS — C50412 Malignant neoplasm of upper-outer quadrant of left female breast: Secondary | ICD-10-CM | POA: Diagnosis not present

## 2019-06-18 ENCOUNTER — Other Ambulatory Visit: Payer: Self-pay

## 2019-06-18 ENCOUNTER — Ambulatory Visit
Admission: RE | Admit: 2019-06-18 | Discharge: 2019-06-18 | Disposition: A | Discharge status: Home / Self Care | Payer: BC Managed Care – PPO | Source: Ambulatory Visit | Attending: Radiation Oncology | Admitting: Radiation Oncology

## 2019-06-18 DIAGNOSIS — C50412 Malignant neoplasm of upper-outer quadrant of left female breast: Secondary | ICD-10-CM | POA: Diagnosis not present

## 2019-06-19 ENCOUNTER — Ambulatory Visit
Admission: RE | Admit: 2019-06-19 | Discharge: 2019-06-19 | Disposition: A | Discharge status: Home / Self Care | Payer: BC Managed Care – PPO | Source: Ambulatory Visit | Attending: Radiation Oncology | Admitting: Radiation Oncology

## 2019-06-19 ENCOUNTER — Other Ambulatory Visit: Payer: Self-pay

## 2019-06-19 DIAGNOSIS — C50412 Malignant neoplasm of upper-outer quadrant of left female breast: Secondary | ICD-10-CM | POA: Diagnosis not present

## 2019-06-20 ENCOUNTER — Other Ambulatory Visit: Payer: Self-pay

## 2019-06-20 ENCOUNTER — Ambulatory Visit
Admission: RE | Admit: 2019-06-20 | Discharge: 2019-06-20 | Disposition: A | Discharge status: Home / Self Care | Payer: BC Managed Care – PPO | Source: Ambulatory Visit | Attending: Radiation Oncology | Admitting: Radiation Oncology

## 2019-06-20 DIAGNOSIS — C50412 Malignant neoplasm of upper-outer quadrant of left female breast: Secondary | ICD-10-CM | POA: Diagnosis not present

## 2019-06-21 ENCOUNTER — Ambulatory Visit
Admission: RE | Admit: 2019-06-21 | Discharge: 2019-06-21 | Disposition: A | Discharge status: Home / Self Care | Payer: BC Managed Care – PPO | Source: Ambulatory Visit | Attending: Radiation Oncology | Admitting: Radiation Oncology

## 2019-06-21 ENCOUNTER — Other Ambulatory Visit: Payer: Self-pay

## 2019-06-21 DIAGNOSIS — C50412 Malignant neoplasm of upper-outer quadrant of left female breast: Secondary | ICD-10-CM | POA: Diagnosis not present

## 2019-06-24 ENCOUNTER — Other Ambulatory Visit: Payer: Self-pay

## 2019-06-24 ENCOUNTER — Ambulatory Visit
Admission: RE | Admit: 2019-06-24 | Discharge: 2019-06-24 | Disposition: A | Discharge status: Home / Self Care | Payer: BC Managed Care – PPO | Source: Ambulatory Visit | Attending: Radiation Oncology | Admitting: Radiation Oncology

## 2019-06-24 DIAGNOSIS — C50412 Malignant neoplasm of upper-outer quadrant of left female breast: Secondary | ICD-10-CM | POA: Diagnosis not present

## 2019-06-25 ENCOUNTER — Other Ambulatory Visit: Payer: Self-pay

## 2019-06-25 ENCOUNTER — Ambulatory Visit
Admission: RE | Admit: 2019-06-25 | Discharge: 2019-06-25 | Disposition: A | Discharge status: Home / Self Care | Payer: BC Managed Care – PPO | Source: Ambulatory Visit | Attending: Radiation Oncology | Admitting: Radiation Oncology

## 2019-06-25 ENCOUNTER — Telehealth: Payer: Self-pay | Admitting: Hematology and Oncology

## 2019-06-25 DIAGNOSIS — C50412 Malignant neoplasm of upper-outer quadrant of left female breast: Secondary | ICD-10-CM | POA: Diagnosis not present

## 2019-06-25 NOTE — Telephone Encounter (Signed)
Scheduled appt per 9/21 sch  Message - unable to reach pt . Left message with appt date and time

## 2019-06-26 ENCOUNTER — Ambulatory Visit
Admission: RE | Admit: 2019-06-26 | Discharge: 2019-06-26 | Disposition: A | Discharge status: Home / Self Care | Payer: BC Managed Care – PPO | Source: Ambulatory Visit | Attending: Radiation Oncology | Admitting: Radiation Oncology

## 2019-06-26 ENCOUNTER — Other Ambulatory Visit: Payer: Self-pay

## 2019-06-26 DIAGNOSIS — C50412 Malignant neoplasm of upper-outer quadrant of left female breast: Secondary | ICD-10-CM | POA: Diagnosis not present

## 2019-06-27 ENCOUNTER — Ambulatory Visit
Admission: RE | Admit: 2019-06-27 | Discharge: 2019-06-27 | Disposition: A | Discharge status: Home / Self Care | Payer: BC Managed Care – PPO | Source: Ambulatory Visit | Attending: Radiation Oncology | Admitting: Radiation Oncology

## 2019-06-27 ENCOUNTER — Other Ambulatory Visit: Payer: Self-pay

## 2019-06-27 DIAGNOSIS — C50412 Malignant neoplasm of upper-outer quadrant of left female breast: Secondary | ICD-10-CM | POA: Diagnosis not present

## 2019-06-28 ENCOUNTER — Ambulatory Visit
Admission: RE | Admit: 2019-06-28 | Discharge: 2019-06-28 | Disposition: A | Discharge status: Home / Self Care | Payer: BC Managed Care – PPO | Source: Ambulatory Visit | Attending: Radiation Oncology | Admitting: Radiation Oncology

## 2019-06-28 ENCOUNTER — Other Ambulatory Visit: Payer: Self-pay

## 2019-06-28 DIAGNOSIS — C50412 Malignant neoplasm of upper-outer quadrant of left female breast: Secondary | ICD-10-CM | POA: Diagnosis not present

## 2019-07-01 ENCOUNTER — Ambulatory Visit
Admission: RE | Admit: 2019-07-01 | Discharge: 2019-07-01 | Disposition: A | Discharge status: Home / Self Care | Payer: BC Managed Care – PPO | Source: Ambulatory Visit | Attending: Radiation Oncology | Admitting: Radiation Oncology

## 2019-07-01 ENCOUNTER — Other Ambulatory Visit: Payer: Self-pay

## 2019-07-01 DIAGNOSIS — C50412 Malignant neoplasm of upper-outer quadrant of left female breast: Secondary | ICD-10-CM | POA: Diagnosis not present

## 2019-07-02 ENCOUNTER — Ambulatory Visit
Admission: RE | Admit: 2019-07-02 | Discharge: 2019-07-02 | Disposition: A | Discharge status: Home / Self Care | Payer: BC Managed Care – PPO | Source: Ambulatory Visit | Attending: Radiation Oncology | Admitting: Radiation Oncology

## 2019-07-02 ENCOUNTER — Other Ambulatory Visit: Payer: Self-pay

## 2019-07-02 DIAGNOSIS — C50412 Malignant neoplasm of upper-outer quadrant of left female breast: Secondary | ICD-10-CM | POA: Diagnosis not present

## 2019-07-03 ENCOUNTER — Other Ambulatory Visit: Payer: Self-pay

## 2019-07-03 ENCOUNTER — Ambulatory Visit
Admission: RE | Admit: 2019-07-03 | Discharge: 2019-07-03 | Disposition: A | Discharge status: Home / Self Care | Payer: BC Managed Care – PPO | Source: Ambulatory Visit | Attending: Radiation Oncology | Admitting: Radiation Oncology

## 2019-07-03 DIAGNOSIS — C50412 Malignant neoplasm of upper-outer quadrant of left female breast: Secondary | ICD-10-CM | POA: Diagnosis not present

## 2019-07-04 ENCOUNTER — Ambulatory Visit
Admission: RE | Admit: 2019-07-04 | Discharge: 2019-07-04 | Disposition: A | Discharge status: Home / Self Care | Payer: BC Managed Care – PPO | Source: Ambulatory Visit | Attending: Radiation Oncology | Admitting: Radiation Oncology

## 2019-07-04 ENCOUNTER — Other Ambulatory Visit: Payer: Self-pay

## 2019-07-04 DIAGNOSIS — C50412 Malignant neoplasm of upper-outer quadrant of left female breast: Secondary | ICD-10-CM | POA: Insufficient documentation

## 2019-07-04 DIAGNOSIS — Z510 Encounter for antineoplastic radiation therapy: Secondary | ICD-10-CM | POA: Insufficient documentation

## 2019-07-04 DIAGNOSIS — Z170 Estrogen receptor positive status [ER+]: Secondary | ICD-10-CM | POA: Diagnosis not present

## 2019-07-05 ENCOUNTER — Other Ambulatory Visit: Payer: Self-pay

## 2019-07-05 ENCOUNTER — Ambulatory Visit
Admission: RE | Admit: 2019-07-05 | Discharge: 2019-07-05 | Disposition: A | Discharge status: Home / Self Care | Payer: BC Managed Care – PPO | Source: Ambulatory Visit | Attending: Radiation Oncology | Admitting: Radiation Oncology

## 2019-07-05 DIAGNOSIS — C50412 Malignant neoplasm of upper-outer quadrant of left female breast: Secondary | ICD-10-CM | POA: Diagnosis not present

## 2019-07-08 ENCOUNTER — Other Ambulatory Visit: Payer: Self-pay

## 2019-07-08 ENCOUNTER — Ambulatory Visit
Admission: RE | Admit: 2019-07-08 | Discharge: 2019-07-08 | Disposition: A | Discharge status: Home / Self Care | Payer: BC Managed Care – PPO | Source: Ambulatory Visit | Attending: Radiation Oncology | Admitting: Radiation Oncology

## 2019-07-08 DIAGNOSIS — C50412 Malignant neoplasm of upper-outer quadrant of left female breast: Secondary | ICD-10-CM | POA: Diagnosis not present

## 2019-07-09 ENCOUNTER — Other Ambulatory Visit: Payer: Self-pay

## 2019-07-09 ENCOUNTER — Ambulatory Visit
Admission: RE | Admit: 2019-07-09 | Discharge: 2019-07-09 | Disposition: A | Discharge status: Home / Self Care | Payer: BC Managed Care – PPO | Source: Ambulatory Visit | Attending: Radiation Oncology | Admitting: Radiation Oncology

## 2019-07-09 DIAGNOSIS — C50412 Malignant neoplasm of upper-outer quadrant of left female breast: Secondary | ICD-10-CM

## 2019-07-09 DIAGNOSIS — Z170 Estrogen receptor positive status [ER+]: Secondary | ICD-10-CM

## 2019-07-09 MED ORDER — RADIAPLEXRX EX GEL
Freq: Once | CUTANEOUS | Status: AC
  Administered 2019-07-09: 08:00:00 via TOPICAL

## 2019-07-10 ENCOUNTER — Ambulatory Visit
Admission: RE | Admit: 2019-07-10 | Discharge: 2019-07-10 | Disposition: A | Discharge status: Home / Self Care | Payer: BC Managed Care – PPO | Source: Ambulatory Visit | Attending: Radiation Oncology | Admitting: Radiation Oncology

## 2019-07-10 ENCOUNTER — Other Ambulatory Visit: Payer: Self-pay

## 2019-07-10 DIAGNOSIS — C50412 Malignant neoplasm of upper-outer quadrant of left female breast: Secondary | ICD-10-CM | POA: Diagnosis not present

## 2019-07-11 ENCOUNTER — Other Ambulatory Visit: Payer: Self-pay

## 2019-07-11 ENCOUNTER — Ambulatory Visit
Admission: RE | Admit: 2019-07-11 | Discharge: 2019-07-11 | Disposition: A | Discharge status: Home / Self Care | Payer: BC Managed Care – PPO | Source: Ambulatory Visit | Attending: Radiation Oncology | Admitting: Radiation Oncology

## 2019-07-11 DIAGNOSIS — C50412 Malignant neoplasm of upper-outer quadrant of left female breast: Secondary | ICD-10-CM | POA: Diagnosis not present

## 2019-07-12 ENCOUNTER — Ambulatory Visit
Admission: RE | Admit: 2019-07-12 | Discharge: 2019-07-12 | Disposition: A | Discharge status: Home / Self Care | Payer: BC Managed Care – PPO | Source: Ambulatory Visit | Attending: Radiation Oncology | Admitting: Radiation Oncology

## 2019-07-12 ENCOUNTER — Other Ambulatory Visit: Payer: Self-pay

## 2019-07-12 DIAGNOSIS — C50412 Malignant neoplasm of upper-outer quadrant of left female breast: Secondary | ICD-10-CM | POA: Diagnosis not present

## 2019-07-15 ENCOUNTER — Other Ambulatory Visit: Payer: Self-pay

## 2019-07-15 ENCOUNTER — Ambulatory Visit
Admission: RE | Admit: 2019-07-15 | Discharge: 2019-07-15 | Disposition: A | Discharge status: Home / Self Care | Payer: BC Managed Care – PPO | Source: Ambulatory Visit | Attending: Radiation Oncology | Admitting: Radiation Oncology

## 2019-07-15 DIAGNOSIS — C50412 Malignant neoplasm of upper-outer quadrant of left female breast: Secondary | ICD-10-CM | POA: Diagnosis not present

## 2019-07-16 ENCOUNTER — Ambulatory Visit: Payer: BC Managed Care – PPO

## 2019-07-16 ENCOUNTER — Ambulatory Visit
Admission: RE | Admit: 2019-07-16 | Discharge: 2019-07-16 | Disposition: A | Discharge status: Home / Self Care | Payer: BC Managed Care – PPO | Source: Ambulatory Visit | Attending: Radiation Oncology | Admitting: Radiation Oncology

## 2019-07-16 ENCOUNTER — Other Ambulatory Visit: Payer: Self-pay

## 2019-07-16 DIAGNOSIS — C50412 Malignant neoplasm of upper-outer quadrant of left female breast: Secondary | ICD-10-CM | POA: Diagnosis not present

## 2019-07-17 ENCOUNTER — Ambulatory Visit: Payer: BC Managed Care – PPO

## 2019-07-17 ENCOUNTER — Ambulatory Visit
Admission: RE | Admit: 2019-07-17 | Discharge: 2019-07-17 | Disposition: A | Discharge status: Home / Self Care | Payer: BC Managed Care – PPO | Source: Ambulatory Visit | Attending: Radiation Oncology | Admitting: Radiation Oncology

## 2019-07-17 ENCOUNTER — Other Ambulatory Visit: Payer: Self-pay

## 2019-07-17 DIAGNOSIS — C50412 Malignant neoplasm of upper-outer quadrant of left female breast: Secondary | ICD-10-CM | POA: Diagnosis not present

## 2019-07-18 ENCOUNTER — Other Ambulatory Visit: Payer: Self-pay

## 2019-07-18 ENCOUNTER — Ambulatory Visit
Admission: RE | Admit: 2019-07-18 | Discharge: 2019-07-18 | Disposition: A | Discharge status: Home / Self Care | Payer: BC Managed Care – PPO | Source: Ambulatory Visit | Attending: Radiation Oncology | Admitting: Radiation Oncology

## 2019-07-18 DIAGNOSIS — C50412 Malignant neoplasm of upper-outer quadrant of left female breast: Secondary | ICD-10-CM | POA: Diagnosis not present

## 2019-07-19 ENCOUNTER — Other Ambulatory Visit (HOSPITAL_COMMUNITY): Payer: BC Managed Care – PPO

## 2019-07-19 ENCOUNTER — Other Ambulatory Visit: Payer: Self-pay

## 2019-07-19 ENCOUNTER — Ambulatory Visit
Admission: RE | Admit: 2019-07-19 | Discharge: 2019-07-19 | Disposition: A | Discharge status: Home / Self Care | Payer: BC Managed Care – PPO | Source: Ambulatory Visit | Attending: Radiation Oncology | Admitting: Radiation Oncology

## 2019-07-19 DIAGNOSIS — C50412 Malignant neoplasm of upper-outer quadrant of left female breast: Secondary | ICD-10-CM | POA: Diagnosis not present

## 2019-07-21 NOTE — Progress Notes (Signed)
Patient Care Team: Emeterio Reeve, DO as PCP - General (Osteopathic Medicine) Rolm Bookbinder, MD as Consulting Physician (General Surgery) Nicholas Lose, MD as Consulting Physician (Hematology and Oncology) Eppie Gibson, MD as Attending Physician (Radiation Oncology)  DIAGNOSIS:    ICD-10-CM   1. Malignant neoplasm of upper-outer quadrant of left breast in female, estrogen receptor positive ([place])  C50.412    Z17.0     SUMMARY OF ONCOLOGIC HISTORY: Oncology History  Malignant neoplasm of upper-outer quadrant of left breast in female, estrogen receptor positive ([place])  09/12/2018 Initial Diagnosis   Palpable left breast mass at 2:30 position by ultrasound measured 1.6 cm, calcifications spanning 5.9 cm, 3 abnormal lymph nodes, multiple masses and calcifications biopsy of the mass revealed grade 2 IDC plus DCIS with lymphovascular invasion, ER 100%, PR 60%, Ki-67 30%, HER-2 negative IHC 1+, pathology of calcifications is pending, biopsy of lymph nodes is discordant benign, T1cN0 stage Ia   09/19/2018 Cancer Staging   Staging form: Breast, AJCC 8th Edition - Clinical stage from 09/19/2018: Stage IA (cT1c(m), cN0, cM0, G2, ER+, PR+, HER2-) - Signed by Nicholas Lose, MD on 09/19/2018   11/13/2018 Surgery   Left mastectomy: Grade 2 IDC, 1.6 cm, with intermediate grade DCIS, margins negative, lymphovascular invasion present, 1/7 lymph nodes positive, MammaPrint high risk, ER 100%, PR 30%, HER-2 negative, Ki-67 30% T1CN1A stage Ia   11/13/2018 Cancer Staging   Staging form: Breast, AJCC 8th Edition - Pathologic stage from 11/13/2018: Stage IA (pT1c, pN1a, cM0, G2, ER+, PR+, HER2-) - Signed by Gardenia Phlegm, NP on 11/28/2018   12/17/2018 - 04/22/2019 Neo-Adjuvant Chemotherapy   4 cycles dose dense Adriamycin and Cytoxan, followed by Taxol and carboplatin (discontinued after 10 cycles)   06/06/2019 -  Radiation Therapy   Adjuvant radiation     CHIEF COMPLIANT: Follow-up to  discuss anti-estrogen therapy  INTERVAL HISTORY: Heather [name] is a 58 y.o. with above-mentioned history of left breast cancer treated with mastectomy, adjuvant chemotherapy and who is currently undergoing radiation therapy. She presents to the clinic today to discuss further anti-estrogen treatment.  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  REVIEW OF SYSTEMS:   Constitutional: Denies fevers, chills or abnormal weight loss Eyes: Denies blurriness of vision Ears, nose, mouth, throat, and face: Denies mucositis or sore throat Respiratory: Denies cough, dyspnea or wheezes Cardiovascular: Denies palpitation, chest discomfort Gastrointestinal: Denies nausea, heartburn or change in bowel habits Skin: Denies abnormal skin rashes Lymphatics: Denies new lymphadenopathy or easy bruising Neurological:  mild peripheral neuropathy in the toes Behavioral/Psych: Mood is stable, no new changes  Extremities: No lower extremity edema Breast: denies any pain or lumps or nodules in either breasts All other systems were reviewed with the patient and are negative.  I have reviewed the past medical history, past surgical history, social history and family history with the patient and they are unchanged from previous note.  ALLERGIES:  has No Known Allergies.  MEDICATIONS:  Current Outpatient Medications  Medication Sig Dispense Refill  . lidocaine-prilocaine (EMLA) cream Apply 1 application topically as needed. 30 g 0  . lisinopril-hydrochlorothiazide (ZESTORETIC) 10-12.5 MG tablet TAKE 1 TABLET BY MOUTH EVERY DAY 90 tablet 0  . LORazepam (ATIVAN) 0.5 MG tablet Take 1 tablet (0.5 mg total) by mouth at bedtime as needed for sleep. (Patient not taking: Reported on 05/14/2019) 30 tablet 0  . ondansetron (ZOFRAN) 4 MG tablet TAKE 1 TABLET (4 MG TOTAL) BY MOUTH EVERY 6 (  SIX) HOURS AS NEEDED FOR UP TO 5 DAYS    . ondansetron (ZOFRAN) 8 MG tablet Take 1 tablet (8 mg total) by  mouth 2 (two) times daily as needed. Start on the third day after chemotherapy. (Patient not taking: Reported on 12/04/2018) 30 tablet 1  . prochlorperazine (COMPAZINE) 10 MG tablet Take 1 tablet (10 mg total) by mouth every 6 (six) hours as needed (Nausea or vomiting). (Patient not taking: Reported on 12/04/2018) 30 tablet 1   No current facility-administered medications for this visit.    Facility-Administered Medications Ordered in Other Visits  Medication Dose Route Frequency Provider Last Rate Last Dose  . sodium chloride flush (NS) 0.9 % injection 10 mL  10 mL Intravenous PRN Nicholas Lose, MD   10 mL at 12/17/18 0907    PHYSICAL EXAMINATION: ECOG PERFORMANCE STATUS: 1 - Symptomatic but completely ambulatory  Vitals:   07/22/19 0935  BP: (!) 142/85  Pulse: (!) 108  Resp: 18  Temp: 98 F (36.7 C)  SpO2: 100%   Filed Weights   07/22/19 0935  Weight: 112 lb 8 oz (51 kg)    GENERAL: alert, no distress and comfortable SKIN: skin color, texture, turgor are normal, no rashes or significant lesions EYES: normal, Conjunctiva are pink and non-injected, sclera clear OROPHARYNX: no exudate, no erythema and lips, buccal mucosa, and tongue normal  NECK: supple, thyroid normal size, non-tender, without nodularity LYMPH: no palpable lymphadenopathy in the cervical, axillary or inguinal LUNGS: clear to auscultation and percussion with normal breathing effort HEART: regular rate & rhythm and no murmurs and no lower extremity edema ABDOMEN: abdomen soft, non-tender and normal bowel sounds MUSCULOSKELETAL: no cyanosis of digits and no clubbing  NEURO: alert & oriented x 3 with fluent speech, 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 EXTREMITIES: No lower extremity edema  LABORATORY DATA:  I have reviewed the data as listed CMP Latest Ref Rng & Units 04/22/2019 04/15/2019 04/08/2019  Glucose 70 - 99 mg/dL 109(H) 88 107(H)  BUN 6 - 20 mg/dL _0 Creatinine 0.44 - 1.00 mg/dL  0.85 0.70 0.76  Sodium 135 - 145 mmol/L 142 141 139  Potassium 3.5 - 5.1 mmol/L 3.6 4.0 3.9  Chloride 98 - 111 mmol/L 107 109 105  CO2 22 - 32 mmol/L _1 Calcium 8.9 - 10.3 mg/dL 8.8(L) 9.0 9.0  Total Protein 6.5 - 8.1 g/dL 6.0(L) 6.0(L) 6.4(L)  Total Bilirubin 0.3 - 1.2 mg/dL 0.3 0.3 0.3  Alkaline Phos 38 - 126 U/L 64 62 65  AST 15 - 41 U/L 37 41 49(H)  ALT 0 - 44 U/L 31 30 50(H)    Lab Results  Component Value Date   WBC 7.3 04/22/2019   HGB 10.3 (L) 04/22/2019   HCT 33.6 (L) 04/22/2019   MCV 87.5 04/22/2019   PLT 314 04/22/2019   NEUTROABS 5.9 04/22/2019    ASSESSMENT & PLAN:  Malignant neoplasm of upper-outer quadrant of left breast in female, estrogen receptor positive ([place]) 11/13/2018:Left mastectomy: Grade 2 IDC, 1.6 cm, with intermediate grade DCIS, margins negative, lymphovascular invasion present, 1/7 lymph nodes positive, MammaPrint high risk, ER 100%, PR 30%, HER-2 negative, Ki-67 30% T1CN1A stage Ia  Treatment plan: 1.Adjuvant chemotherapy with dose since he remains on Cytoxan x4 followed by Taxol weekly x10 completed 04/22/2019  2.Followed by adjuvant radiation therapy 06/06/2019-07/19/2019 3.Followed by adjuvant antiestrogen therapy ------------------------------------------------------------------------------------------------------------------ Treatment plan: Adjuvant antiestrogen therapy with anastrozole 1 mg p.o. daily  Anastrozole counseling:We discussed  the risks and benefits of anti-estrogen therapy with aromatase inhibitors. These include but not limited to insomnia, hot flashes, mood changes, vaginal dryness, bone density loss, and weight gain. We strongly believe that the benefits far outweigh the risks. Patient understands these risks and consented to starting treatment. Planned treatment duration is 5-7 years.  Return to clinic in 3 months for survivorship care plan visit.  I will see her 6 months from that appointment.  No orders of the  defined types were placed in this encounter.  The patient has a good understanding of the overall plan. she agrees with it. she will call with any problems that may develop before the next visit here.  Nicholas Lose, MD 07/22/2019  Julious Oka Dorshimer am acting as scribe for Dr. Nicholas Lose.  I have reviewed the above documentation for accuracy and completeness, and I agree with the above.

## 2019-07-22 ENCOUNTER — Other Ambulatory Visit: Payer: Self-pay

## 2019-07-22 ENCOUNTER — Ambulatory Visit: Payer: BC Managed Care – PPO

## 2019-07-22 ENCOUNTER — Ambulatory Visit
Admission: RE | Admit: 2019-07-22 | Discharge: 2019-07-22 | Disposition: A | Discharge status: Home / Self Care | Payer: BC Managed Care – PPO | Source: Ambulatory Visit | Attending: Radiation Oncology | Admitting: Radiation Oncology

## 2019-07-22 ENCOUNTER — Inpatient Hospital Stay: Payer: BC Managed Care – PPO | Attending: Genetic Counselor | Admitting: Hematology and Oncology

## 2019-07-22 DIAGNOSIS — Z9221 Personal history of antineoplastic chemotherapy: Secondary | ICD-10-CM | POA: Diagnosis not present

## 2019-07-22 DIAGNOSIS — Z170 Estrogen receptor positive status [ER+]: Secondary | ICD-10-CM | POA: Diagnosis not present

## 2019-07-22 DIAGNOSIS — G629 Polyneuropathy, unspecified: Secondary | ICD-10-CM | POA: Insufficient documentation

## 2019-07-22 DIAGNOSIS — Z9012 Acquired absence of left breast and nipple: Secondary | ICD-10-CM | POA: Insufficient documentation

## 2019-07-22 DIAGNOSIS — C50412 Malignant neoplasm of upper-outer quadrant of left female breast: Secondary | ICD-10-CM | POA: Diagnosis present

## 2019-07-22 DIAGNOSIS — Z923 Personal history of irradiation: Secondary | ICD-10-CM | POA: Insufficient documentation

## 2019-07-22 DIAGNOSIS — Z79899 Other long term (current) drug therapy: Secondary | ICD-10-CM | POA: Insufficient documentation

## 2019-07-22 MED ORDER — ANASTROZOLE 1 MG PO TABS: 1.0000 mg | ORAL_TABLET | Freq: Every day | ORAL | 3 refills | Status: DC

## 2019-07-22 NOTE — Assessment & Plan Note (Signed)
11/13/2018:Left mastectomy: Grade 2 IDC, 1.6 cm, with intermediate grade DCIS, margins negative, lymphovascular invasion present, 1/7 lymph nodes positive, MammaPrint high risk, ER 100%, PR 30%, HER-2 negative, Ki-67 30% T1CN1A stage Ia  Treatment plan: 1.Adjuvant chemotherapy with dose since he remains on Cytoxan x4 followed by Taxol weekly x10 completed 04/22/2019  2.Followed by adjuvant radiation therapy 06/06/2019-07/19/2019 3.Followed by adjuvant antiestrogen therapy ------------------------------------------------------------------------------------------------------------------ Treatment plan: Adjuvant antiestrogen therapy with anastrozole 1 mg p.o. daily  Anastrozole counseling:We discussed the risks and benefits of anti-estrogen therapy with aromatase inhibitors. These include but not limited to insomnia, hot flashes, mood changes, vaginal dryness, bone density loss, and weight gain. We strongly believe that the benefits far outweigh the risks. Patient understands these risks and consented to starting treatment. Planned treatment duration is 7 years.  Return to clinic in 3 months for survivorship care plan visit

## 2019-07-23 ENCOUNTER — Ambulatory Visit
Admission: RE | Admit: 2019-07-23 | Discharge: 2019-07-23 | Disposition: A | Discharge status: Home / Self Care | Payer: BC Managed Care – PPO | Source: Ambulatory Visit | Attending: Radiation Oncology | Admitting: Radiation Oncology

## 2019-07-23 ENCOUNTER — Encounter: Payer: Self-pay | Admitting: Radiation Oncology

## 2019-07-23 ENCOUNTER — Encounter: Payer: Self-pay | Admitting: *Deleted

## 2019-07-23 ENCOUNTER — Other Ambulatory Visit: Payer: Self-pay

## 2019-07-23 DIAGNOSIS — C50412 Malignant neoplasm of upper-outer quadrant of left female breast: Secondary | ICD-10-CM | POA: Diagnosis not present

## 2019-07-24 ENCOUNTER — Telehealth: Payer: Self-pay | Admitting: Hematology and Oncology

## 2019-07-24 NOTE — Telephone Encounter (Signed)
Confirmed 11/01/19 SCP visit with patient.

## 2019-08-01 ENCOUNTER — Other Ambulatory Visit: Payer: Self-pay | Admitting: Osteopathic Medicine

## 2019-08-01 DIAGNOSIS — I10 Essential (primary) hypertension: Secondary | ICD-10-CM

## 2019-08-01 NOTE — Telephone Encounter (Signed)
Forwarding medication refill request to the clinical pool for review. 

## 2019-08-19 ENCOUNTER — Other Ambulatory Visit: Payer: Self-pay

## 2019-08-19 ENCOUNTER — Encounter (HOSPITAL_BASED_OUTPATIENT_CLINIC_OR_DEPARTMENT_OTHER): Payer: Self-pay | Admitting: *Deleted

## 2019-08-21 NOTE — Progress Notes (Signed)
I called the patient today about her upcoming follow-up appointment in radiation oncology.   Given concerns about the COVID-19 pandemic, I offered a phone assessment with the patient to determine if coming to the clinic was necessary. She accepted.  I let the patient know that I had spoken with Dr. Isidore Moos, and she wanted them to know the importance of washing their hands for at least 20 seconds at a time, especially after going out in public, and before they eat.  Limit going out in public whenever possible. Do not touch your face, unless your hands are clean, such as when bathing. Get plenty of rest, eat well, and stay hydrated.   The patient denies any symptomatic concerns.  Specifically, they report good healing of their skin in the radiation fields.  Skin is intact.    I recommended that she continue skin care by applying oil or lotion with vitamin E to the skin in the radiation fields, BID, for 2 more months.  Continue follow-up with medical oncology - follow-up is scheduled on 11/01/19 with Wilber Bihari NP with survivorship .  I explained that yearly mammograms are important for patients with intact breast tissue, and physical exams are important after mastectomy for patients that cannot undergo mammography.  I encouraged her to call if she had further questions or concerns about her healing. Otherwise, she will follow-up PRN in radiation oncology. Patient is pleased with this plan, and we will cancel her upcoming follow-up to reduce the risk of COVID-19 transmission.

## 2019-08-22 ENCOUNTER — Encounter (HOSPITAL_BASED_OUTPATIENT_CLINIC_OR_DEPARTMENT_OTHER)
Admission: RE | Admit: 2019-08-22 | Discharge: 2019-08-22 | Disposition: A | Discharge status: Home / Self Care | Payer: BC Managed Care – PPO | Source: Ambulatory Visit | Attending: General Surgery | Admitting: General Surgery

## 2019-08-22 ENCOUNTER — Other Ambulatory Visit: Payer: Self-pay

## 2019-08-22 ENCOUNTER — Other Ambulatory Visit (HOSPITAL_COMMUNITY): Payer: BC Managed Care – PPO

## 2019-08-22 DIAGNOSIS — C50919 Malignant neoplasm of unspecified site of unspecified female breast: Secondary | ICD-10-CM | POA: Diagnosis not present

## 2019-08-22 DIAGNOSIS — Z452 Encounter for adjustment and management of vascular access device: Secondary | ICD-10-CM | POA: Diagnosis present

## 2019-08-22 DIAGNOSIS — Z87891 Personal history of nicotine dependence: Secondary | ICD-10-CM | POA: Diagnosis not present

## 2019-08-22 DIAGNOSIS — Z79899 Other long term (current) drug therapy: Secondary | ICD-10-CM | POA: Diagnosis not present

## 2019-08-22 DIAGNOSIS — I10 Essential (primary) hypertension: Secondary | ICD-10-CM | POA: Diagnosis not present

## 2019-08-22 LAB — BASIC METABOLIC PANEL
Anion gap: 8 (ref 5–15)
BUN: 15 mg/dL (ref 6–20)
CO2: 28 mmol/L (ref 22–32)
Calcium: 9.5 mg/dL (ref 8.9–10.3)
Chloride: 104 mmol/L (ref 98–111)
Creatinine, Ser: 0.91 mg/dL (ref 0.44–1.00)
GFR calc Af Amer: >60 mL/min (ref >60)
GFR calc non Af Amer: >60 mL/min (ref >60)
Glucose, Bld: 107 mg/dL — ABNORMAL HIGH (ref 70–99)
Potassium: 4 mmol/L (ref 3.5–5.1)
Sodium: 140 mmol/L (ref 135–145)

## 2019-08-22 MED ORDER — ENSURE PRE-SURGERY PO LIQD: 296.0000 mL | Freq: Once | ORAL | Status: DC

## 2019-08-22 NOTE — Progress Notes (Signed)

## 2019-08-23 ENCOUNTER — Ambulatory Visit
Admission: RE | Admit: 2019-08-23 | Discharge: 2019-08-23 | Disposition: A | Discharge status: Home / Self Care | Payer: BC Managed Care – PPO | Source: Ambulatory Visit | Attending: Radiation Oncology | Admitting: Radiation Oncology

## 2019-08-23 ENCOUNTER — Other Ambulatory Visit: Payer: Self-pay

## 2019-08-23 ENCOUNTER — Other Ambulatory Visit (HOSPITAL_COMMUNITY)
Admission: RE | Admit: 2019-08-23 | Discharge: 2019-08-23 | Disposition: A | Discharge status: Home / Self Care | Payer: BC Managed Care – PPO | Source: Ambulatory Visit | Attending: General Surgery | Admitting: General Surgery

## 2019-08-23 DIAGNOSIS — Z01812 Encounter for preprocedural laboratory examination: Secondary | ICD-10-CM | POA: Insufficient documentation

## 2019-08-23 DIAGNOSIS — Z20828 Contact with and (suspected) exposure to other viral communicable diseases: Secondary | ICD-10-CM | POA: Insufficient documentation

## 2019-08-23 LAB — SARS CORONAVIRUS 2 (TAT 6-24 HRS): SARS Coronavirus 2: NEGATIVE

## 2019-08-26 ENCOUNTER — Encounter (HOSPITAL_BASED_OUTPATIENT_CLINIC_OR_DEPARTMENT_OTHER)
Admission: RE | Disposition: A | Discharge status: Home / Self Care | Payer: Self-pay | Source: Home / Self Care | Attending: General Surgery

## 2019-08-26 ENCOUNTER — Encounter (HOSPITAL_BASED_OUTPATIENT_CLINIC_OR_DEPARTMENT_OTHER): Payer: Self-pay | Admitting: *Deleted

## 2019-08-26 ENCOUNTER — Ambulatory Visit (HOSPITAL_BASED_OUTPATIENT_CLINIC_OR_DEPARTMENT_OTHER): Payer: BC Managed Care – PPO | Admitting: Certified Registered"

## 2019-08-26 ENCOUNTER — Other Ambulatory Visit: Payer: Self-pay

## 2019-08-26 ENCOUNTER — Ambulatory Visit (HOSPITAL_BASED_OUTPATIENT_CLINIC_OR_DEPARTMENT_OTHER)
Admission: RE | Admit: 2019-08-26 | Discharge: 2019-08-26 | Disposition: A | Discharge status: Home / Self Care | Payer: BC Managed Care – PPO | Attending: General Surgery | Admitting: General Surgery

## 2019-08-26 DIAGNOSIS — C50919 Malignant neoplasm of unspecified site of unspecified female breast: Secondary | ICD-10-CM | POA: Insufficient documentation

## 2019-08-26 DIAGNOSIS — Z452 Encounter for adjustment and management of vascular access device: Secondary | ICD-10-CM | POA: Insufficient documentation

## 2019-08-26 DIAGNOSIS — I10 Essential (primary) hypertension: Secondary | ICD-10-CM | POA: Insufficient documentation

## 2019-08-26 DIAGNOSIS — Z87891 Personal history of nicotine dependence: Secondary | ICD-10-CM | POA: Insufficient documentation

## 2019-08-26 DIAGNOSIS — Z79899 Other long term (current) drug therapy: Secondary | ICD-10-CM | POA: Insufficient documentation

## 2019-08-26 HISTORY — PX: PORT-A-CATH REMOVAL: SHX5289

## 2019-08-26 SURGERY — REMOVAL PORT-A-CATH
Anesthesia: Monitor Anesthesia Care | Site: Chest | Laterality: Right

## 2019-08-26 MED ORDER — PROPOFOL 500 MG/50ML IV EMUL
INTRAVENOUS | Status: DC | PRN
  Administered 2019-08-26: 100 ug/kg/min via INTRAVENOUS

## 2019-08-26 MED ORDER — MIDAZOLAM HCL 2 MG/2ML IJ SOLN
INTRAMUSCULAR | Status: AC
  Filled 2019-08-26: qty 2

## 2019-08-26 MED ORDER — MIDAZOLAM HCL 2 MG/2ML IJ SOLN
1.0000 mg | INTRAMUSCULAR | Status: DC | PRN
  Administered 2019-08-26: 2 mg via INTRAVENOUS

## 2019-08-26 MED ORDER — BUPIVACAINE HCL (PF) 0.25 % IJ SOLN
INTRAMUSCULAR | Status: AC
  Filled 2019-08-26: qty 30

## 2019-08-26 MED ORDER — GABAPENTIN 100 MG PO CAPS
100.0000 mg | ORAL_CAPSULE | ORAL | Status: AC
  Administered 2019-08-26: 100 mg via ORAL

## 2019-08-26 MED ORDER — PROMETHAZINE HCL 25 MG/ML IJ SOLN: 6.2500 mg | INTRAMUSCULAR | Status: DC | PRN

## 2019-08-26 MED ORDER — GABAPENTIN 100 MG PO CAPS
ORAL_CAPSULE | ORAL | Status: AC
  Filled 2019-08-26: qty 1

## 2019-08-26 MED ORDER — ONDANSETRON HCL 4 MG/2ML IJ SOLN
INTRAMUSCULAR | Status: DC | PRN
  Administered 2019-08-26: 4 mg via INTRAVENOUS

## 2019-08-26 MED ORDER — PROPOFOL 10 MG/ML IV BOLUS
INTRAVENOUS | Status: AC
  Filled 2019-08-26: qty 20

## 2019-08-26 MED ORDER — PHENYLEPHRINE HCL (PRESSORS) 10 MG/ML IV SOLN
INTRAVENOUS | Status: DC | PRN
  Administered 2019-08-26: 40 ug via INTRAVENOUS

## 2019-08-26 MED ORDER — CEFAZOLIN SODIUM-DEXTROSE 2-4 GM/100ML-% IV SOLN
INTRAVENOUS | Status: AC
  Filled 2019-08-26: qty 100

## 2019-08-26 MED ORDER — ACETAMINOPHEN 500 MG PO TABS
1000.0000 mg | ORAL_TABLET | ORAL | Status: AC
  Administered 2019-08-26: 1000 mg via ORAL

## 2019-08-26 MED ORDER — FENTANYL CITRATE (PF) 100 MCG/2ML IJ SOLN: 25.0000 ug | INTRAMUSCULAR | Status: DC | PRN

## 2019-08-26 MED ORDER — FENTANYL CITRATE (PF) 100 MCG/2ML IJ SOLN
50.0000 ug | INTRAMUSCULAR | Status: DC | PRN
  Administered 2019-08-26: 50 ug via INTRAVENOUS

## 2019-08-26 MED ORDER — CEFAZOLIN SODIUM-DEXTROSE 2-4 GM/100ML-% IV SOLN
2.0000 g | INTRAVENOUS | Status: AC
  Administered 2019-08-26: 2 g via INTRAVENOUS

## 2019-08-26 MED ORDER — BUPIVACAINE HCL (PF) 0.25 % IJ SOLN
INTRAMUSCULAR | Status: DC | PRN
  Administered 2019-08-26: 7 mL

## 2019-08-26 MED ORDER — FENTANYL CITRATE (PF) 100 MCG/2ML IJ SOLN
INTRAMUSCULAR | Status: AC
  Filled 2019-08-26: qty 2

## 2019-08-26 MED ORDER — LACTATED RINGERS IV SOLN
INTRAVENOUS | Status: DC
  Administered 2019-08-26: 07:00:00 via INTRAVENOUS

## 2019-08-26 MED ORDER — ACETAMINOPHEN 500 MG PO TABS
ORAL_TABLET | ORAL | Status: AC
  Filled 2019-08-26: qty 2

## 2019-08-26 MED ORDER — EPHEDRINE SULFATE 50 MG/ML IJ SOLN
INTRAMUSCULAR | Status: DC | PRN
  Administered 2019-08-26 (×2): 10 mg via INTRAVENOUS

## 2019-08-26 SURGICAL SUPPLY — 32 items
BLADE SURG 15 STRL LF DISP TIS (BLADE) ×1 IMPLANT
BLADE SURG 15 STRL SS (BLADE) ×2
CHLORAPREP W/TINT 26 (MISCELLANEOUS) ×3 IMPLANT
COVER BACK TABLE REUSABLE LG (DRAPES) ×3 IMPLANT
COVER MAYO STAND REUSABLE (DRAPES) ×3 IMPLANT
COVER WAND RF STERILE (DRAPES) IMPLANT
DECANTER SPIKE VIAL GLASS SM (MISCELLANEOUS) ×3 IMPLANT
DERMABOND ADVANCED (GAUZE/BANDAGES/DRESSINGS) ×2
DERMABOND ADVANCED .7 DNX12 (GAUZE/BANDAGES/DRESSINGS) ×1 IMPLANT
DRAPE LAPAROTOMY 100X72 PEDS (DRAPES) ×3 IMPLANT
DRAPE UTILITY XL STRL (DRAPES) ×3 IMPLANT
ELECT COATED BLADE 2.86 ST (ELECTRODE) ×3 IMPLANT
ELECT REM PT RETURN 9FT ADLT (ELECTROSURGICAL) ×3
ELECTRODE REM PT RTRN 9FT ADLT (ELECTROSURGICAL) ×1 IMPLANT
GLOVE BIO SURGEON STRL SZ 6.5 (GLOVE) ×1 IMPLANT
GLOVE BIO SURGEON STRL SZ7 (GLOVE) ×3 IMPLANT
GLOVE BIO SURGEONS STRL SZ 6.5 (GLOVE) ×1
GLOVE BIOGEL PI IND STRL 6.5 (GLOVE) IMPLANT
GLOVE BIOGEL PI IND STRL 7.5 (GLOVE) ×1 IMPLANT
GLOVE BIOGEL PI INDICATOR 6.5 (GLOVE) ×2
GLOVE BIOGEL PI INDICATOR 7.5 (GLOVE) ×2
GOWN STRL REUS W/ TWL LRG LVL3 (GOWN DISPOSABLE) ×2 IMPLANT
GOWN STRL REUS W/TWL LRG LVL3 (GOWN DISPOSABLE) ×4
NDL HYPO 25X1 1.5 SAFETY (NEEDLE) ×1 IMPLANT
NEEDLE HYPO 25X1 1.5 SAFETY (NEEDLE) ×3 IMPLANT
PACK BASIN DAY SURGERY FS (CUSTOM PROCEDURE TRAY) ×3 IMPLANT
PENCIL SMOKE EVACUATOR (MISCELLANEOUS) ×3 IMPLANT
SLEEVE SCD COMPRESS KNEE MED (MISCELLANEOUS) IMPLANT
SUT MNCRL AB 4-0 PS2 18 (SUTURE) ×3 IMPLANT
SUT VIC AB 3-0 SH 27 (SUTURE) ×2
SUT VIC AB 3-0 SH 27X BRD (SUTURE) ×1 IMPLANT
SYR CONTROL 10ML LL (SYRINGE) ×3 IMPLANT

## 2019-08-26 NOTE — Op Note (Signed)
Preoperative diagnosis: Breast cancer, no longer needs venous access Postoperative diagnosis: Same as above Procedure: Port removal Surgeon: Dr. Serita Grammes Anesthesia: Local with MAC Specimens: None Drains: None Estimated blood loss: Minimal Complications: None Sponge needle count was correct at completion Disposition to recovery stable condition  Indications: This is a 58 year old female is completed her treatment for breast cancer and is now being followed.  She no longer needs venous access at the sac port removal.  Procedure: After informed consent was obtained the patient was taken to the operating room.  She was placed under monitored anesthesia care per her choice.  She was prepped and draped in the standard sterile surgical fashion.  A surgical timeout was then performed.  I infiltrated lidocaine at the prior incision where her port was.  I then reentered the incision.  I removed the port, line, and all the suture material in their entirety.  I then obtained hemostasis.  I closed this with 3-0 Vicryl for Monocryl.  Glue and Steri-Strips were applied.  She tolerated this well and was transferred to recovery.

## 2019-08-26 NOTE — Anesthesia Preprocedure Evaluation (Signed)
Anesthesia Evaluation  Patient identified by MRN, date of birth, ID band Patient awake    Reviewed: Allergy & Precautions, NPO status , Patient's Chart, lab work & pertinent test results  Airway Mallampati: II  TM Distance: >3 FB Neck ROM: Full    Dental no notable dental hx.    Pulmonary neg pulmonary ROS, former smoker,    Pulmonary exam normal breath sounds clear to auscultation       Cardiovascular hypertension, Normal cardiovascular exam Rhythm:Regular Rate:Normal     Neuro/Psych negative neurological ROS  negative psych ROS   GI/Hepatic negative GI ROS, Neg liver ROS,   Endo/Other  negative endocrine ROS  Renal/GU negative Renal ROS  negative genitourinary   Musculoskeletal negative musculoskeletal ROS (+)   Abdominal   Peds negative pediatric ROS (+)  Hematology negative hematology ROS (+)   Anesthesia Other Findings   Reproductive/Obstetrics negative OB ROS                             Anesthesia Physical Anesthesia Plan  ASA: II  Anesthesia Plan: MAC   Post-op Pain Management:    Induction: Intravenous  PONV Risk Score and Plan: 2 and Ondansetron, Dexamethasone and Treatment may vary due to age or medical condition  Airway Management Planned: Simple Face Mask  Additional Equipment:   Intra-op Plan:   Post-operative Plan:   Informed Consent: I have reviewed the patients History and Physical, chart, labs and discussed the procedure including the risks, benefits and alternatives for the proposed anesthesia with the patient or authorized representative who has indicated his/her understanding and acceptance.     Dental advisory given  Plan Discussed with: CRNA and Surgeon  Anesthesia Plan Comments:         Anesthesia Quick Evaluation

## 2019-08-26 NOTE — Anesthesia Postprocedure Evaluation (Signed)
Anesthesia Post Note  Patient: Heather [name]  Procedure(s) Performed: REMOVAL PORT-A-CATH (Right Chest)     Patient location during evaluation: PACU Anesthesia Type: MAC Level of consciousness: awake and alert Pain management: pain level controlled Vital Signs Assessment: post-procedure vital signs reviewed and stable Respiratory status: spontaneous breathing, nonlabored ventilation, respiratory function stable and patient connected to nasal cannula oxygen Cardiovascular status: stable and blood pressure returned to baseline Postop Assessment: no apparent nausea or vomiting Anesthetic complications: no    Last Vitals:  Vitals:   08/26/19 0800 08/26/19 0815  BP: (!) 89/55 (!) 99/59  Pulse: 81 77  Resp: 20 16  Temp:    SpO2: 100% 100%    Last Pain:  Vitals:   08/26/19 0800  TempSrc:   PainSc: 0-No pain                 Aivan Fillingim S

## 2019-08-26 NOTE — Interval H&P Note (Signed)
History and Physical Interval Note:  08/26/2019 7:21 AM  Heather [name]  has presented today for surgery, with the diagnosis of BREAST CANCER.  The various methods of treatment have been discussed with the patient and family. After consideration of risks, benefits and other options for treatment, the patient has consented to  Procedure(s): REMOVAL PORT-A-CATH (N/A) as a surgical intervention.  The patient's history has been reviewed, patient examined, no change in status, stable for surgery.  I have reviewed the patient's chart and labs.  Questions were answered to the patient's satisfaction.     Rolm Bookbinder

## 2019-08-26 NOTE — Transfer of Care (Signed)
Immediate Anesthesia Transfer of Care Note  Patient: Heather [name]  Procedure(s) Performed: REMOVAL PORT-A-CATH (Right Chest)  Patient Location: PACU  Anesthesia Type:MAC  Level of Consciousness: drowsy  Airway & Oxygen Therapy: Patient Spontanous Breathing and Patient connected to face mask oxygen  Post-op Assessment: Report given to RN and Post -op Vital signs reviewed and stable  Post vital signs: Reviewed and stable  Last Vitals:  Vitals Value Taken Time  BP 92/56 08/26/19 0756  Temp    Pulse 80 08/26/19 0758  Resp 16 08/26/19 0758  SpO2 100 % 08/26/19 0758  Vitals shown include unvalidated device data.  Last Pain:  Vitals:   08/26/19 0645  TempSrc: Temporal  PainSc: 0-No pain         Complications: No apparent anesthesia complications

## 2019-08-26 NOTE — Discharge Instructions (Signed)
° ° ° °  Always review your discharge instruction sheet given to you by the facility where your surgery was performed.    Take acetaminophen (Tylenol) or ibuprofen (Advil) as needed. Use ice on the incision a couple times a day for first few days 1. Take your usually prescribed medications unless otherwise directed. 2. You should follow a light diet for the remainder of the day after your procedure. 3. Most patients will experience some mild swelling and/or bruising in the area of the incision. It may take several days to resolve. 4. It is common to experience some constipation if taking pain medication after surgery. Increasing fluid intake and taking a stool softener (such as Colace) will usually help or prevent this problem from occurring. A mild laxative (Milk of Magnesia or Miralax) should be taken according to package directions if there are no bowel movements after 48 hours.  5. Unless discharge instructions indicate otherwise, you may remove your bandages 48 hours after surgery, and you may shower at that time. You may have steri-strips (small white skin tapes) in place directly over the incision.  These strips should be left on the skin for 7-10 days.  If your surgeon used Dermabond (skin glue) on the incision, you may shower in 24 hours.  The glue will flake off over the next 2-3 weeks.  6. ACTIVITIES:  May resume normal activities tomorrow  WHEN TO [place] [phone]): 1. Fever over 101.0 2. Chills 3. Continued bleeding from incision 4. Increased redness and tenderness at the site 5. Shortness of breath, difficulty breathing   The clinic staff is available to answer your questions during regular business hours. Please dont hesitate to call and ask to speak to one of the nurses or medical assistants for clinical concerns. If you have a medical emergency, go to the nearest emergency room or call 911.  A surgeon from [facility] is always on call at the hospital.      For further information, please visit [email or web address]   Post Anesthesia Home Care Instructions  Activity: Get plenty of rest for the remainder of the day. A responsible individual must stay with you for 24 hours following the procedure.  For the next 24 hours, DO NOT: -Drive a car -Paediatric nurse -Drink alcoholic beverages -Take any medication unless instructed by your physician -Make any legal decisions or sign important papers.  Meals: Start with liquid foods such as gelatin or soup. Progress to regular foods as tolerated. Avoid greasy, spicy, heavy foods. If nausea and/or vomiting occur, drink only clear liquids until the nausea and/or vomiting subsides. Call your physician if vomiting continues.  Special Instructions/Symptoms: Your throat may feel dry or sore from the anesthesia or the breathing tube placed in your throat during surgery. If this causes discomfort, gargle with warm salt water. The discomfort should disappear within 24 hours.  If you had a scopolamine patch placed behind your ear for the management of post- operative nausea and/or vomiting:  1. The medication in the patch is effective for 72 hours, after which it should be removed.  Wrap patch in a tissue and discard in the trash. Wash hands thoroughly with soap and water. 2. You may remove the patch earlier than 72 hours if you experience unpleasant side effects which may include dry mouth, dizziness or visual disturbances. 3. Avoid touching the patch. Wash your hands with soap and water after contact with the patch.

## 2019-08-26 NOTE — H&P (Signed)
Heather [name] is an 58 y.o. female.   Chief Complaint: breast cancer HPI: 53 yof s/p treatment for breast cancer, doing well, no longer needs venous access  Past Medical History:  Diagnosis Date  . Cancer [facility])    Breast Cancer 09/2018  . Family history of breast cancer   . Hypertension     Past Surgical History:  Procedure Laterality Date  . BREAST RECONSTRUCTION WITH PLACEMENT OF TISSUE EXPANDER AND ALLODERM Left 11/13/2018   Procedure: LEFT BREAST RECONSTRUCTION WITH PLACEMENT OF TISSUE EXPANDER AND ALLODERM;  Surgeon: Irene Limbo, MD;  Location: [place];  Service: Plastics;  Laterality: Left;  . CESAREAN SECTION    . PORTACATH PLACEMENT Right 12/11/2018   Procedure: INSERTION PORT-A-CATH WITH ULTRASOUND;  Surgeon: Rolm Bookbinder, MD;  Location: [place];  Service: General;  Laterality: Right;  . TUBAL LIGATION      Family History  Problem Relation Age of Onset  . Hypertension Mother   . Diabetes Mother   . Hypertension Sister   . Breast cancer Maternal Aunt 90  . Breast cancer Cousin 48       mat first cousin  . Heart attack Father 13   Social History:  reports that she quit smoking about 12 months ago. Her smoking use included cigarettes. She has never used smokeless tobacco. She reports current alcohol use. She reports that she does not use drugs.  Allergies: No Known Allergies  Medications Prior to Admission  Medication Sig Dispense Refill  . anastrozole (ARIMIDEX) 1 MG tablet Take 1 tablet (1 mg total) by mouth daily. 90 tablet 3  . lisinopril-hydrochlorothiazide (ZESTORETIC) 10-12.5 MG tablet TAKE 1 TABLET BY MOUTH EVERY DAY 90 tablet 0    No results found for this or any previous visit (from the past 48 hour(s)). No results found.  Review of Systems  All other systems reviewed and are negative.   Blood pressure (!) 131/59, pulse (!) 109, temperature 97.7 F (36.5 C), temperature source Temporal, resp. rate 16, height _0   (1.676 m), weight 51.2 kg, SpO2 100 %. Physical Exam  cv rrr  lungs clear Right sided port in place  Assessment/Plan Breast cancer Port removal under Richardson Dopp, MD 08/26/2019, 7:20 AM

## 2019-08-28 ENCOUNTER — Encounter (HOSPITAL_BASED_OUTPATIENT_CLINIC_OR_DEPARTMENT_OTHER): Payer: Self-pay | Admitting: General Surgery

## 2019-09-10 ENCOUNTER — Encounter: Payer: Self-pay | Admitting: *Deleted

## 2019-09-18 ENCOUNTER — Telehealth: Payer: Self-pay | Admitting: Adult Health

## 2019-09-18 NOTE — Telephone Encounter (Signed)
[place] day 1/29 moved to earlier time and converted to [place]. Confirmed with patient.

## 2019-10-02 ENCOUNTER — Other Ambulatory Visit: Payer: Self-pay

## 2019-10-02 DIAGNOSIS — I10 Essential (primary) hypertension: Secondary | ICD-10-CM

## 2019-10-02 MED ORDER — LISINOPRIL-HYDROCHLOROTHIAZIDE 10-12.5 MG PO TABS: 1.0000 | ORAL_TABLET | Freq: Every day | ORAL | 1 refills | Status: DC

## 2019-10-07 NOTE — Progress Notes (Signed)
  Patient Name: Heather [name] MRN: [id number] DOB: 1961-01-01 Referring Physician: Nicholas Lose (Profile Not Attached) Date of Service: 07/23/2019 [facility], [place]                                                        End Of Treatment Note  Diagnoses: C50.412-Malignant neoplasm of upper-outer quadrant of left female breast  Cancer Staging:  Cancer Staging Malignant neoplasm of upper-outer quadrant of left breast in female, estrogen receptor positive ([facility]) Staging form: Breast, AJCC 8th Edition - Clinical stage from 09/19/2018: Stage IA (cT1c(m), cN0, cM0, G2, ER+, PR+, HER2-) - Signed by Nicholas Lose, MD on 09/19/2018 - Pathologic stage from 11/13/2018: Stage IA (pT1c, pN1a, cM0, G2, ER+, PR+, HER2-) - Signed by Gardenia Phlegm, NP on 11/28/2018   Intent: Curative  Radiation Treatment Dates: 06/06/2019 through 07/23/2019 Site Technique Total Dose (Gy) Dose per Fx (Gy) Completed Fx Beam Energies  Breast: CW_Lt 3D 50.4/50.4 1.8 28/28 6X  Breast: CW_Lt_SCV_PAB 3D 50.4/50.4 1.8 28/28 6X, 10X  Breast: CW_Lt_Bst Electron 10/10 2 5/5 6E   Narrative: The patient tolerated radiation therapy relatively well.   Plan: The patient will follow-up with radiation oncology in [place] as needed. -----------------------------------  SEppie Gibson MD

## 2019-11-01 ENCOUNTER — Encounter: Payer: Self-pay | Admitting: Adult Health

## 2019-11-01 ENCOUNTER — Inpatient Hospital Stay: Payer: BC Managed Care – PPO | Attending: Adult Health | Admitting: Adult Health

## 2019-11-01 DIAGNOSIS — Z170 Estrogen receptor positive status [ER+]: Secondary | ICD-10-CM | POA: Diagnosis not present

## 2019-11-01 DIAGNOSIS — G620 Drug-induced polyneuropathy: Secondary | ICD-10-CM

## 2019-11-01 DIAGNOSIS — T451X5A Adverse effect of antineoplastic and immunosuppressive drugs, initial encounter: Secondary | ICD-10-CM | POA: Diagnosis not present

## 2019-11-01 DIAGNOSIS — C50412 Malignant neoplasm of upper-outer quadrant of left female breast: Secondary | ICD-10-CM | POA: Diagnosis not present

## 2019-11-01 NOTE — Progress Notes (Signed)
SURVIVORSHIP VIRTUAL VISIT:  I connected with Heather [name] on 11/01/19 at  1:00 PM EST by telephone and verified that I am speaking with the correct person using two identifiers.  I discussed the limitations, risks, security and privacy concerns of performing an evaluation and management service by telephone and the availability of in person appointments. I also discussed with the patient that there may be a patient responsible charge related to this service. The patient expressed understanding and agreed to proceed.   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 Anastrozole 1 mg started 07/2019.    11/13/2018 Surgery   Left mastectomy Heather Hazel) [phone]): Grade 2 IDC, 1.6 cm, with intermediate grade DCIS, margins negative, lymphovascular invasion present, 1/7 lymph nodes positive, MammaPrint high risk, ER 100%, PR 30%, HER-2 negative, Ki-67 30% T1CN1A stage Ia   11/13/2018 Cancer Staging   Staging form: Breast, AJCC 8th Edition - Pathologic stage from 11/13/2018: Stage IA (pT1c, pN1a, cM0, G2, ER+, PR+, HER2-) - Signed by Gardenia Phlegm, NP on 11/28/2018   11/13/2018 Oncotype  testing   MammaPrint results reveal a high risk luminal type-B with a 94.6% predicted benefit of treatment at 5 years with both chemotherapy and hormonal therapy.     06/06/2019 - 07/23/2019 Radiation Therapy   The patient initially received a dose of 50.4 Gy in 28 fractions to the breast and 50.4 Gy in 28 fractions to the left supraclavicular region using whole-breast tangent fields. This was delivered using a 3-D conformal technique. The pt received a boost delivering an additional 10 Gy in 5 fractions using a electron boost with 75mV electrons. The total dose was 110.8 Gy.     Chemotherapy   The patient had DOXOrubicin (ADRIAMYCIN) chemo injection 92 mg, 60 mg/m2 = 92 mg, Intravenous,  Once, 4 of 4 cycles  Administration: 92 mg (12/17/2018), 92 mg (12/31/2018), 92 mg (01/14/2019), 92 mg (01/28/2019)    palonosetron (ALOXI) injection 0.25 mg, 0.25 mg, Intravenous,  Once, 4 of 4 cycles  Administration: 0.25 mg (12/17/2018), 0.25 mg (12/31/2018), 0.25 mg (01/14/2019), 0.25 mg (01/28/2019)    pegfilgrastim (NEULASTA) injection 6 mg, 6 mg, Subcutaneous, Once, 2 of 2 cycles  Administration: 6 mg (01/16/2019), 6 mg (01/30/2019)    pegfilgrastim (NEULASTA ONPRO KIT) injection 6 mg, 6 mg, Subcutaneous, Once, 1 of 1 cycle    pegfilgrastim-cbqv (UDENYCA) injection 6 mg, 6 mg, Subcutaneous, Once, 2 of 2 cycles  Administration: 6 mg (12/19/2018), 6 mg (01/02/2019)    cyclophosphamide (CYTOXAN) 920 mg in sodium chloride 0.9 % 250 mL chemo infusion, 600 mg/m2 = 920 mg, Intravenous,  Once, 4 of 4 cycles  Administration: 920 mg (12/17/2018), 920 mg (12/31/2018), 920 mg (01/14/2019), 920 mg (01/28/2019)  PACLitaxel (TAXOL) 120 mg in sodium chloride 0.9 % 250 mL chemo infusion (</= 63m/m2), 80 mg/m2 = 120 mg, Intravenous,  Once, 10 of 12 cycles  Dose modification: 60 mg/m2 (original dose 80 mg/m2, Cycle 12, Reason: Dose not tolerated)  Administration: 120 mg (02/11/2019), 120 mg (02/18/2019), 120 mg  (02/26/2019), 120 mg (03/04/2019), 120 mg (03/18/2019), 120 mg (03/25/2019), 120 mg (04/01/2019), 90 mg (04/08/2019), 90 mg (04/15/2019), 90 mg (04/22/2019)    fosaprepitant (EMEND) 150 mg, dexamethasone (DECADRON) 12 mg in sodium chloride 0.9 % 145 mL IVPB, , Intravenous,  Once, 4 of 4 cycles  Administration:  (12/17/2018),  (12/31/2018),  (01/14/2019),  (01/28/2019)  for chemotherapy treatment.       INTERVAL HISTORY:  Heather [name] review her survivorship care plan detailing her treatment course for breast cancer, as well as monitoring long-term side effects of that treatment, education regarding health maintenance, screening, and overall wellness and health promotion.     Overall, Ms. WNewtonreports feeling quite well  She notes occasional numbness in her toes.  She also says her breast is a little bit dark.  She is taking anastrozole daily.  She denies any hot flashes, vaginal dryness or arthralgias.     REVIEW OF SYSTEMS:  Review of Systems  Constitutional: Negative for appetite change, chills, fatigue, fever and unexpected weight change.  HENT:   Negative for hearing loss, lump/mass and sore throat.   Eyes: Negative for eye problems and icterus.  Respiratory: Negative for chest tightness, cough and shortness of breath.   Cardiovascular: Negative for chest pain, leg swelling and palpitations.  Gastrointestinal: Negative for abdominal distention, abdominal pain, constipation, diarrhea, nausea and vomiting.  Endocrine: Negative for hot flashes.  Genitourinary: Negative for difficulty urinating.   Musculoskeletal: Negative for arthralgias.  Skin: Negative for itching and rash.  Neurological: Negative for dizziness, extremity weakness, headaches and numbness.  Hematological: Negative for adenopathy. Does not bruise/bleed easily.  Psychiatric/Behavioral: Negative for depression. The patient is not nervous/anxious.   Breast: Denies any new nodularity, masses, tenderness, nipple changes, or  nipple discharge.     ONCOLOGY TREATMENT TEAM:  1. Surgeon:  [name] 2. Medical Oncologist: Dr. GLindi Adie 3. Radiation Oncologist: Dr. SIsidore Moos   PAST MEDICAL/SURGICAL HISTORY:  Past Medical History:  Diagnosis Date  . Cancer [facility]    Breast Cancer 09/2018  . Family history of breast cancer   . Hypertension    Past Surgical History:  Procedure Laterality Date  . BREAST RECONSTRUCTION WITH PLACEMENT OF TISSUE EXPANDER AND ALLODERM Left 11/13/2018   Procedure: LEFT BREAST RECONSTRUCTION WITH PLACEMENT OF TISSUE EXPANDER AND ALLODERM;  Surgeon: TIrene Limbo MD;  Location[place]  Service: Plastics;  Laterality: Left;  . CESAREAN SECTION    . PORT-A-CATH REMOVAL Right 08/26/2019   Procedure: REMOVAL PORT-A-CATH;  Surgeon: WRolm Bookbinder MD;  [place]  Service: General;  Laterality: Right;  . PORTACATH PLACEMENT Right 12/11/2018   Procedure: INSERTION PORT-A-CATH WITH ULTRASOUND;  Surgeon: WRolm Bookbinder MD;  [place]  Service: General;  Laterality: Right;  . TUBAL LIGATION       ALLERGIES:  No Known Allergies   CURRENT MEDICATIONS:  Outpatient Encounter Medications as of 11/01/2019  Medication Sig  . anastrozole (ARIMIDEX) 1 MG tablet Take 1 tablet (1 mg total) by mouth daily.  .Marland Kitchenlisinopril-hydrochlorothiazide (ZESTORETIC) 10-12.5 MG tablet Take 1 tablet by mouth daily.   Facility-Administered Encounter Medications as of 11/01/2019  Medication  . sodium chloride flush (NS) 0.9 % injection 10 mL     ONCOLOGIC FAMILY HISTORY:  Family History  Problem Relation Age of Onset  . Hypertension Mother   . Diabetes Mother   . Hypertension Sister   . Breast cancer Maternal Aunt 90  . Breast cancer Cousin 32       mat first cousin  . Heart attack Father 45     GENETIC COUNSELING/TESTING: Not at this time  SOCIAL HISTORY:  Social History   Socioeconomic History  .  Marital status: Married    Spouse name: Not on file  . Number of children: Not on file  . Years of education: Not on file  . Highest education level: Not on file  Occupational History  . Not on file  Tobacco Use  . Smoking status: Former Smoker    Types: Cigarettes    Quit date: 08/03/2018    Years since quitting: 1.2  . Smokeless tobacco: Never Used  . Tobacco comment: off and on for about 10-15 years.   Substance and Sexual Activity  . Alcohol use: Yes    Comment: rarely  . Drug use: No  . Sexual activity: Not on file  Other Topics Concern  . Not on file  Social History Narrative  . Not on file   Social Determinants of Health   Financial Resource Strain:   . Difficulty of Paying Living Expenses: Not on file  Food Insecurity:   . Worried About Charity fundraiser in the Last Year: Not on file  . Ran Out of Food in the Last Year: Not on file  Transportation Needs: No Transportation Needs  . Lack of Transportation (Medical): No  . Lack of Transportation (Non-Medical): No  Physical Activity:   . Days of Exercise per Week: Not on file  . Minutes of Exercise per Session: Not on file  Stress:   . Feeling of Stress : Not on file  Social Connections:   . Frequency of Communication with Friends and Family: Not on file  . Frequency of Social Gatherings with Friends and Family: Not on file  . Attends Religious Services: Not on file  . Active Member of Clubs or Organizations: Not on file  . Attends Archivist Meetings: Not on file  . Marital Status: Not on file  Intimate Partner Violence: Not At Risk  . Fear of Current or Ex-Partner: No  . Emotionally Abused: No  . Physically Abused: No  . Sexually Abused: No     OBSERVATIONS/OBJECTIVE:  Patient sounds well.  She is in no apparent distress.  Breathing is non labored.  Mood and behavior are normal.    LABORATORY DATA:  None for this visit.  DIAGNOSTIC IMAGING:  None for this visit.      ASSESSMENT AND  PLAN:  Ms.. [name] is a pleasant 59 y.o. female with Stage IA left breast invasive ductal carcinoma, ER+/PR+/HER2-, diagnosed in 09/2018, treated with mastectomy, adjuvant chemotherapy, adjuvant radiation therapy, and anti-estrogen therapy with Anastrozole beginning in 12/2018.  She presents to the Survivorship Clinic for our initial meeting and routine follow-up post-completion of treatment for breast cancer.    1. Stage IA left breast cancer:  Heather [name] is continuing to recover from definitive treatment for breast cancer. She will follow-up with her medical oncologist, Dr. Lindi Adie in 06/2020 with history and physical exam per surveillance protocol.  She will continue her anti-estrogen therapy with Anastrozole. Thus far, she is tolerating the Anastrozole  well, with minimal side effects. She was instructed to make Dr. Lindi Adie or myself aware if she begins to experience any worsening side effects of the medication and I could see her back in clinic to help manage those side effects, as needed. Her mammogram is due now; orders placed today. Today, a comprehensive survivorship care plan and treatment summary was reviewed with the patient today detailing her breast cancer diagnosis, treatment course, potential late/long-term effects of treatment, appropriate follow-up care with recommendations for the future, and patient education resources.  A copy of this summary, along with a letter will be sent to the patient's primary care provider via mail/fax/In Basket message after today's visit.    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  Will monitor.  3. Bone health:  Given Heather [name]'s age/history of breast cancer and her current treatment regimen including anti-estrogen therapy with Anastrozole, she is at risk for bone demineralization.  She has not yet had bone density testing, and I placed orders for this today.  In the meantime, she was encouraged to increase her consumption  of foods rich in calcium, as well as increase her weight-bearing activities.  She was given education on specific activities to promote bone health.  4. Cancer screening:  Due to Heather [name]'s history and her age, she should receive screening for skin cancers, colon cancer, and gynecologic cancers.  The information and recommendations are listed on the patient's comprehensive care plan/treatment summary and were reviewed in detail with the patient.    5. Health maintenance and wellness promotion: Heather [name] was encouraged to consume 5-7 servings of fruits and vegetables per day. We reviewed the "Nutrition Rainbow" handout, as well as the handout "Take Control of Your Health and Reduce Your Cancer Risk" from the [place].  She was also encouraged to engage in moderate to vigorous exercise for 30 minutes per day most days of the week. We discussed the [employer] fitness program, which is designed for cancer survivors to help them become more physically fit after cancer treatments.  She was instructed to limit her alcohol consumption and continue to abstain from tobacco use.     6. Support services/counseling: It is not uncommon for this period of the patient's cancer care trajectory to be one of many emotions and stressors.  We discussed how this can be increasingly difficult during the times of quarantine and social distancing due to the COVID-19 pandemic.   She was given information regarding our available services and encouraged to contact me with any questions or for help enrolling in any of our support group/programs.    Follow up instructions:    -Return to cancer center in 06/2020 for f/u with Dr. Lindi Adie -Mammogram due  -Bone density due -Follow up with surgery 12/2019 -She is welcome to return back to the Survivorship Clinic at any time; no additional follow-up needed at this time.  -Consider referral back to survivorship as a long-term survivor for continued  surveillance  The patient was provided an opportunity to ask questions and all were answered. The patient agreed with the plan and demonstrated an understanding of the instructions.   The patient was advised to call back or seek an in-person evaluation if the symptoms worsen or if the condition fails to improve as anticipated.   I provided 28 minutes of non face-to-face telephone visit time during this encounter, and > 50% was spent counseling as documented under my assessment & plan.  Scot Dock, NP

## 2019-11-14 ENCOUNTER — Ambulatory Visit: Payer: BC Managed Care – PPO | Attending: Family

## 2019-11-14 ENCOUNTER — Other Ambulatory Visit: Payer: Self-pay

## 2019-11-14 DIAGNOSIS — Z23 Encounter for immunization: Secondary | ICD-10-CM | POA: Insufficient documentation

## 2019-11-14 NOTE — Progress Notes (Signed)
   Covid-19 Vaccination Clinic  Name:  Heather [name]    MRN: [id number] DOB: 12-10-1960  11/14/2019  [name] was observed post Covid-19 immunization for 15 minutes without incidence. She was provided with Vaccine Information Sheet and instruction to access the V-Safe system.   [name] was instructed to call 911 with any severe reactions post vaccine: Marland Kitchen Difficulty breathing  . Swelling of your face and throat  . A fast heartbeat  . A bad rash all over your body  . Dizziness and weakness    Immunizations Administered    Name Date Dose VIS Date Route   Moderna COVID-19 Vaccine 11/14/2019 11:33 AM 0.5 mL 09/03/2019 Intramuscular   Manufacturer: Moderna   Lot: [id number]   [id number]

## 2019-12-12 IMAGING — MG MM CLIP PLACEMENT
2 series · 2 of 2 positions shown · non-contrast
Comparison: Previous exam(s).

CLINICAL DATA: Evaluate COIL clip placement following stereotactic
guided LEFT breast biopsy of anterior extent of OUTER LEFT breast
calcifications

EXAM:
DIAGNOSTIC LEFT MAMMOGRAM POST STEREOTACTIC BIOPSY

[L CC]
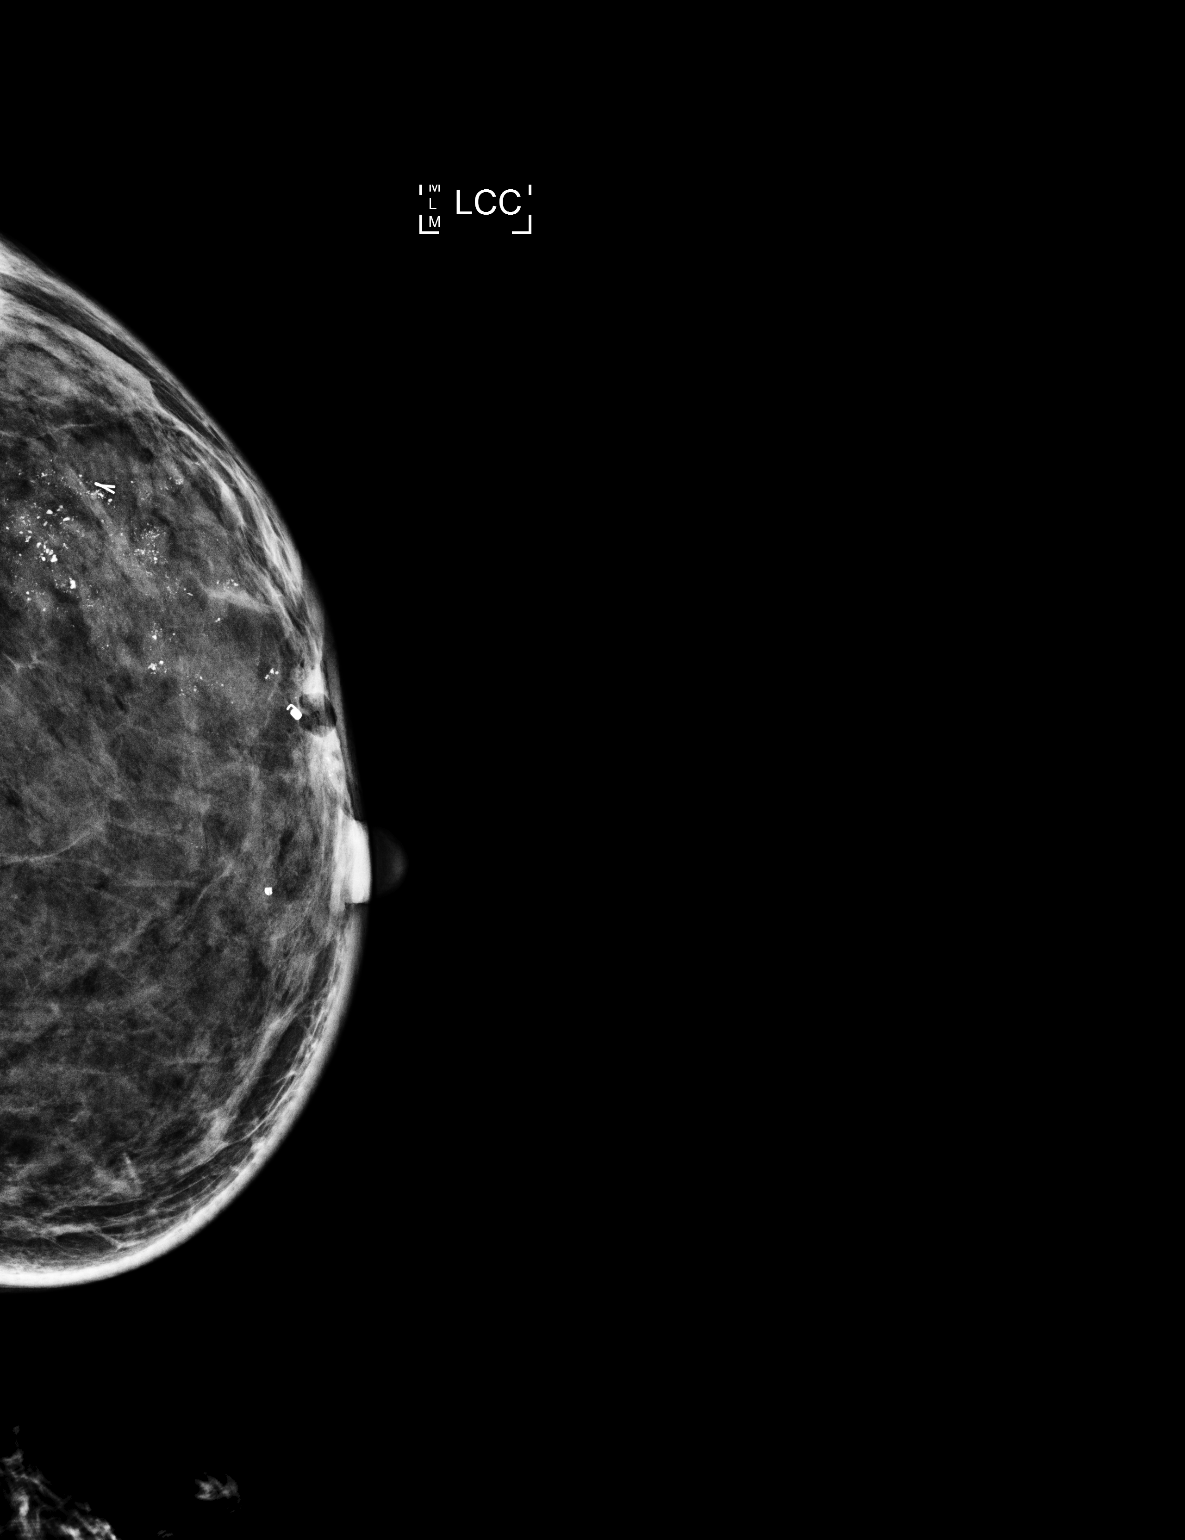

[L ML]
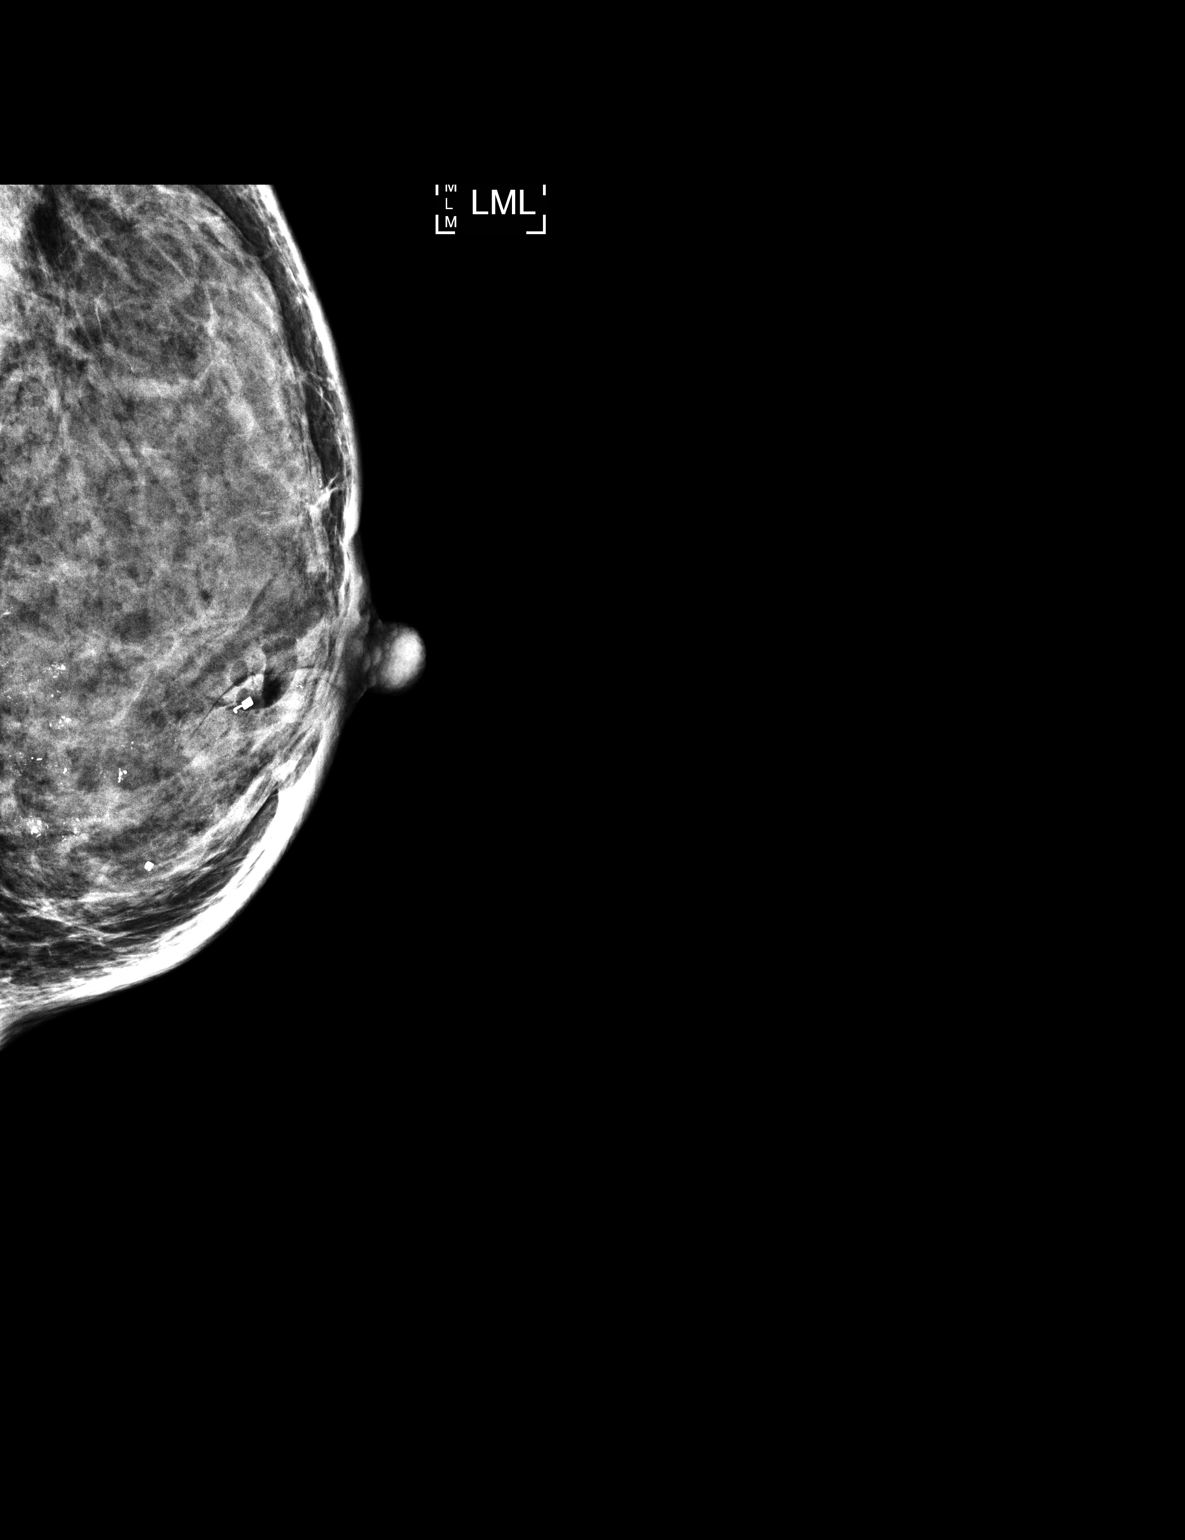

[2 of 2 positions shown; findings below may reference images not displayed]

FINDINGS: Mammographic images were obtained following stereotactic guided
biopsy of anterior extent of calcifications within the anterior
OUTER LEFT breast. The COIL clip is in satisfactory position.

The RIBBON clip is again noted corresponding to the biopsy-proven
malignancy within the UPPER-OUTER LEFT breast.

The COIL clip and the RIBBON clip are separated by a distance of at
least 4 cm on the CC view.

The COIL clip is separated by a distance of at least 6 cm from
visualized posterior calcifications.
IMPRESSION: Satisfactory COIL clip position following stereotactic guided LEFT
breast biopsy. Please note the COIL clip is separated by a distance
of at least 6 cm from visualized posterior extent of calcifications.

Final Assessment: Post Procedure Mammograms for Marker Placement

## 2019-12-12 IMAGING — MG STEREOTACTIC CORE NEEDLE BIOPSY
8 of 12 series · 8 of 20 positions shown · non-contrast
Comparison: Previous exams.

Addendum:
CLINICAL DATA: 57-year-old female with recent diagnosis of LEFT
breast cancer, for tissue sampling of anterior extent of
calcifications within the anterior OUTER LEFT breast.

EXAM:
LEFT BREAST STEREOTACTIC CORE NEEDLE BIOPSY

[L (1 of 8)]
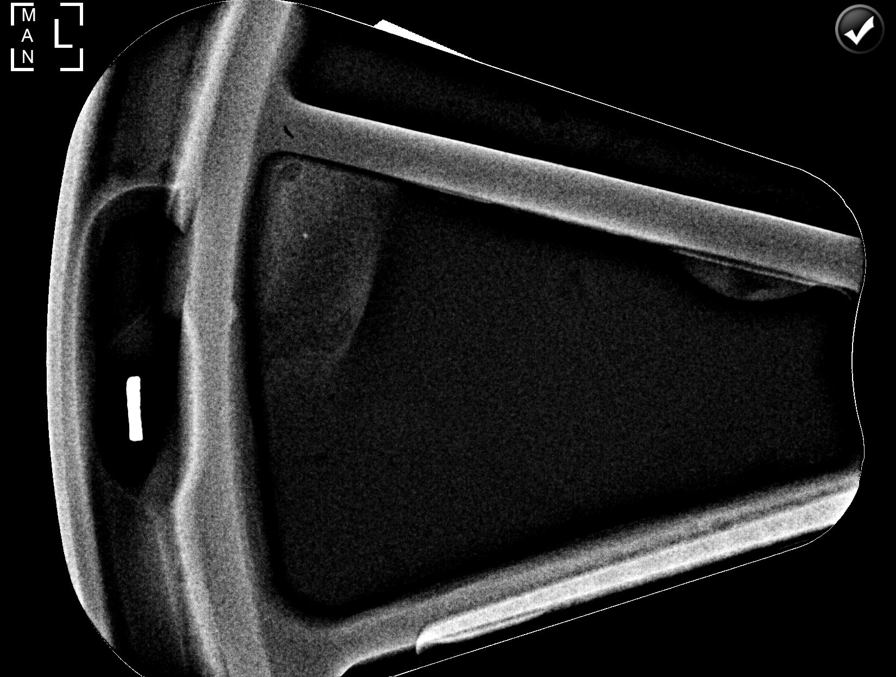

[L (2 of 8)]
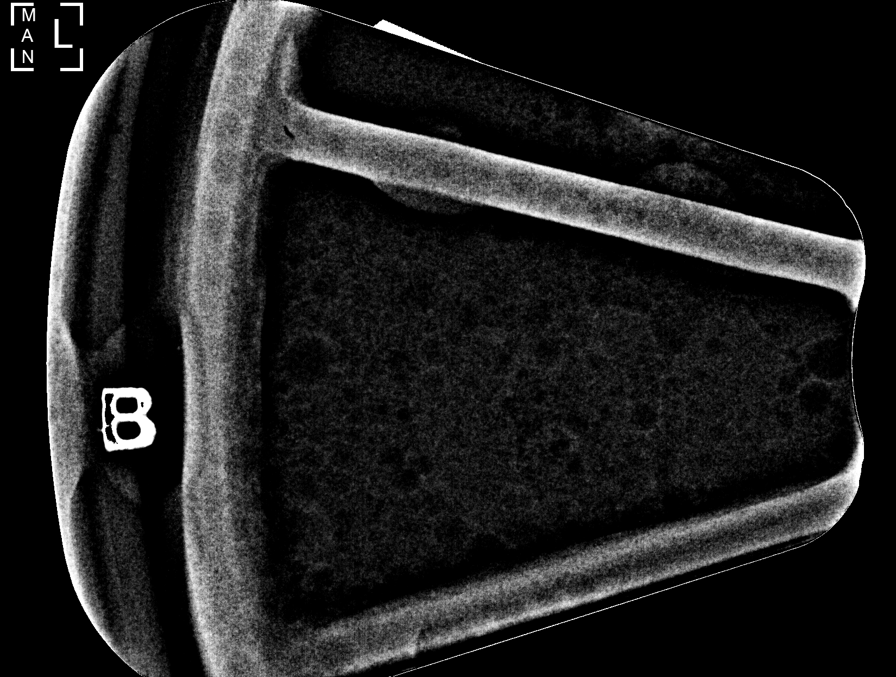

[L (3 of 8)]
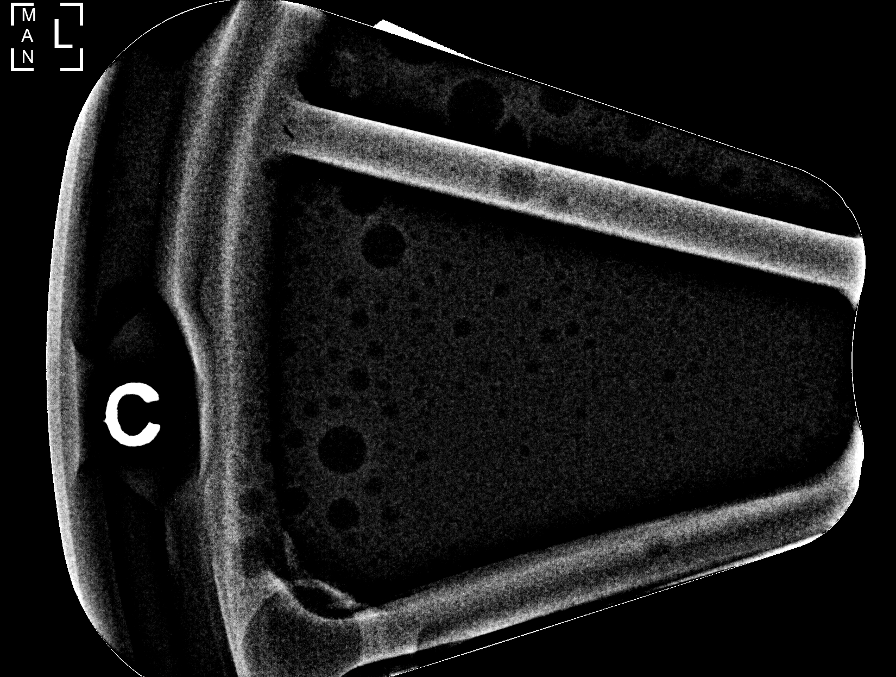

[L (4 of 8)]
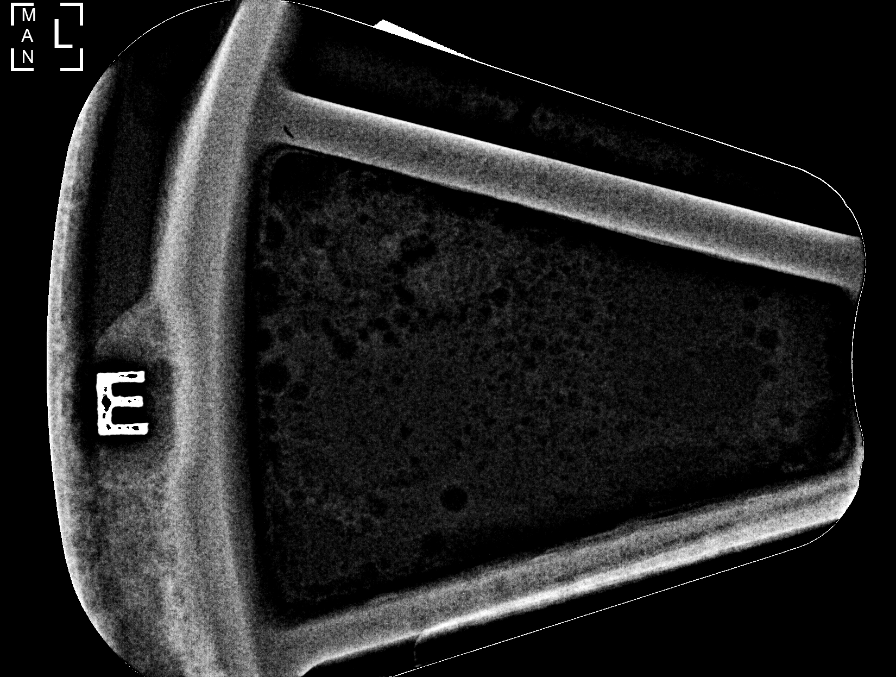

[L (5 of 8)]
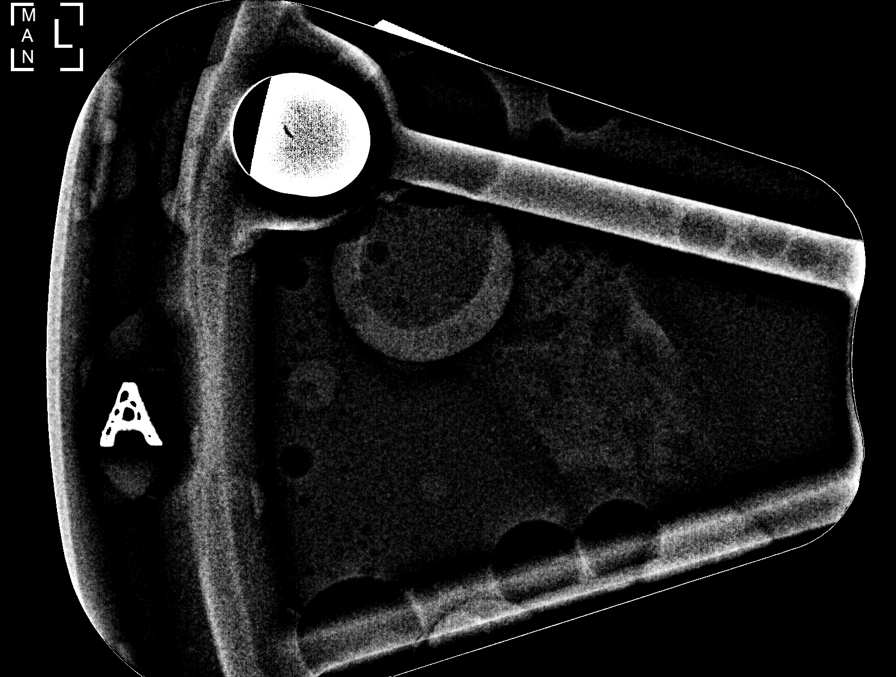

[L (6 of 8)]
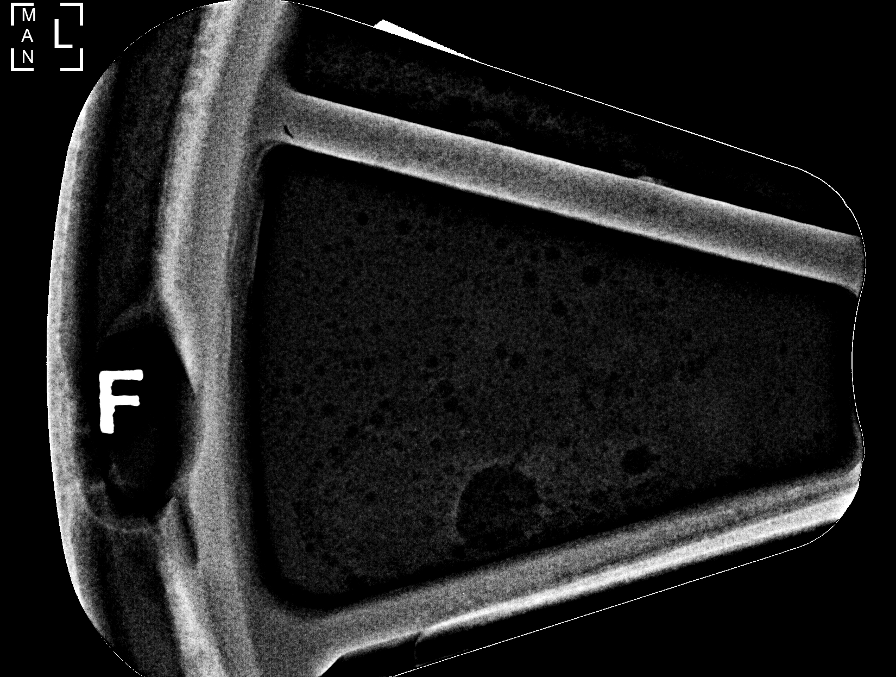

[L (7 of 8)]
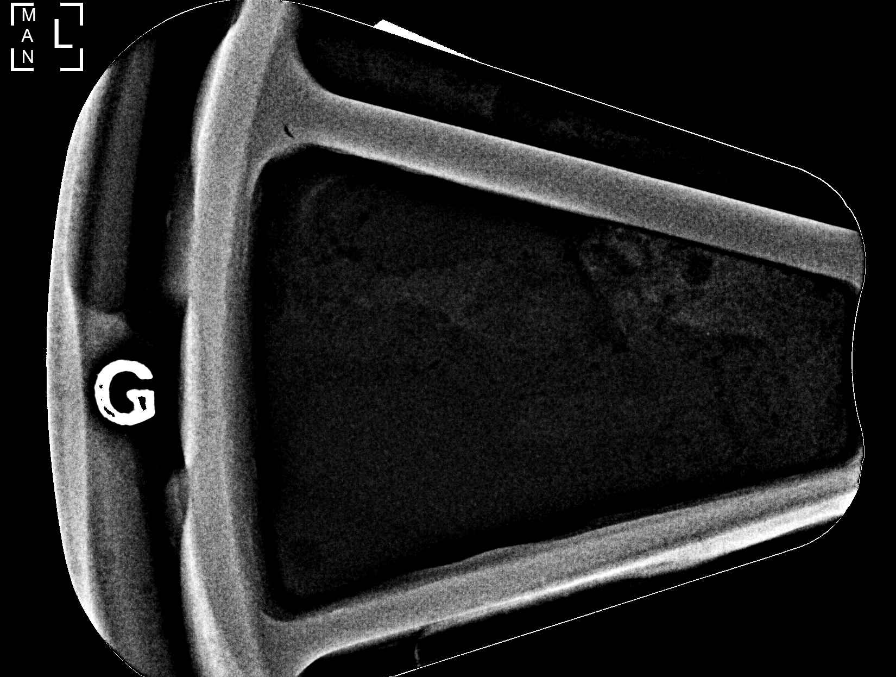

[L (8 of 8)]
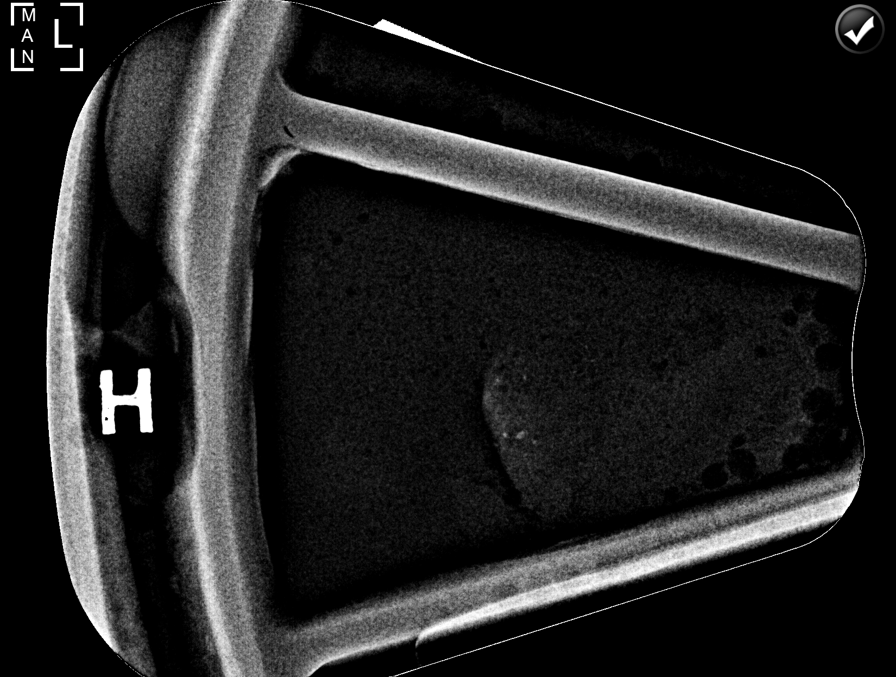

[8 of 20 positions shown; findings below may reference images not displayed]



Using sterile technique and 1% Lidocaine as local anesthetic, under
stereotactic guidance, a 9 gauge vacuum assisted device was used to
perform core needle biopsy of calcifications within the anterior
LOWER OUTER quadrant of the LEFT breast using a SUPERIOR approach.
Specimen radiograph was performed showing calcifications. Specimens
with calcifications are identified for pathology.

Lesion quadrant: LOWER OUTER LEFT breast

At the conclusion of the procedure, a COIL tissue marker clip was
deployed into the biopsy cavity. Follow-up 2-view mammogram was
performed and dictated separately.
IMPRESSION: Stereotactic-guided biopsy of anterior extent of calcifications
within the OUTER LEFT breast. No apparent complications.

ADDENDUM:
Pathology revealed INTERMEDIATE GRADE DUCTAL CARCINOMA IN SITU of
the LEFT breast, anterior lower outer. This was found to be
concordant by Dr. Monthusi Junior Psy Jimson.

Pathology results will be discussed with the patient by Dr. Forsberg
Guru at [REDACTED] [REDACTED] at
[REDACTED] on September 19, 2018.

The patient has a recent diagnosis of left breast cancer and should
follow her outlined treatment plan.

Pathology results reported by Caleb Aujla, RN on 09/19/2018.

*** End of Addendum ***

## 2019-12-17 ENCOUNTER — Ambulatory Visit: Payer: BC Managed Care – PPO | Attending: Family

## 2019-12-17 DIAGNOSIS — Z23 Encounter for immunization: Secondary | ICD-10-CM

## 2019-12-17 NOTE — Progress Notes (Signed)
   Covid-19 Vaccination Clinic  Name:  Heather [name]    MRN: [id number] DOB: Feb 09, 1961  12/17/2019  [name] was observed post Covid-19 immunization for 15 minutes without incident. She was provided with Vaccine Information Sheet and instruction to access the V-Safe system.   [name] was instructed to call 911 with any severe reactions post vaccine: Marland Kitchen Difficulty breathing  . Swelling of face and throat  . A fast heartbeat  . A bad rash all over body  . Dizziness and weakness   Immunizations Administered    Name Date Dose VIS Date Route   Moderna COVID-19 Vaccine 12/17/2019 10:34 AM 0.5 mL 09/03/2019 Intramuscular   Manufacturer: Moderna   Lot: [id number]   [place][id number]

## 2020-01-17 ENCOUNTER — Ambulatory Visit
Admission: RE | Admit: 2020-01-17 | Discharge: 2020-01-17 | Disposition: A | Discharge status: Home / Self Care | Payer: BC Managed Care – PPO | Source: Ambulatory Visit | Attending: Adult Health | Admitting: Adult Health

## 2020-01-17 ENCOUNTER — Other Ambulatory Visit: Payer: Self-pay

## 2020-01-17 DIAGNOSIS — Z170 Estrogen receptor positive status [ER+]: Secondary | ICD-10-CM

## 2020-01-17 DIAGNOSIS — C50412 Malignant neoplasm of upper-outer quadrant of left female breast: Secondary | ICD-10-CM

## 2020-01-20 ENCOUNTER — Telehealth: Payer: Self-pay | Admitting: *Deleted

## 2020-01-20 NOTE — Telephone Encounter (Signed)
Attempt x1 to contact pt regarding recent bone density report showing T-score of -1.1 osteopenia and to educate pt on taking calcium, vitamin D and increasing weight bearing exercise.  No answer, LVM to return call to the office.

## 2020-01-22 ENCOUNTER — Telehealth: Payer: Self-pay

## 2020-01-22 ENCOUNTER — Other Ambulatory Visit: Payer: Self-pay | Admitting: Adult Health

## 2020-01-22 DIAGNOSIS — Z170 Estrogen receptor positive status [ER+]: Secondary | ICD-10-CM

## 2020-01-22 DIAGNOSIS — C50412 Malignant neoplasm of upper-outer quadrant of left female breast: Secondary | ICD-10-CM

## 2020-01-22 NOTE — Telephone Encounter (Signed)
-----   Message from Gardenia Phlegm, NP sent at 01/19/2020  6:44 AM EDT ----- Mammogram is normal.  With breast density category D, it makes it more difficult for the radiologist to see all of the breast tissue to detect cancer.  I would recommend supplemental MRI of the breasts to be completed in October (6 months after Mammo) if patient agreeable.    The breast MRI is very sensitive, so it will pick up everything in the breasts, meaning, higher incidence of false positives.   If patient has any questions, feel free to transfer her to me.  Thanks, Mendel Ryder ----- Message ----- From: Buel Ream, Rad Results In Sent: 01/17/2020  11:55 AM EDT To: Gardenia Phlegm, NP

## 2020-01-22 NOTE — Telephone Encounter (Signed)
Called and given below message. She verbalized understanding. She is agreeable to MRI in October and would like Heather Bihari, NP to order please.  Given earlier message regarding bone density showing T-score of -1.1 osteopenia and to educate pt on taking calcium, vitamin D and increasing weight bearing exercise.  She verbalized understanding.

## 2020-01-22 NOTE — Progress Notes (Signed)
Patient has very dense breasts.  Category D.  Ordered MRI to be completed in 07/2020.    Wilber Bihari, NP

## 2020-03-05 IMAGING — CR PORTABLE CHEST - 1 VIEW
1 series · 1 of 1 positions shown · non-contrast
Comparison: None.

CLINICAL DATA: Port-A-Cath placement.

EXAM:
PORTABLE CHEST 1 VIEW

[chest ap]
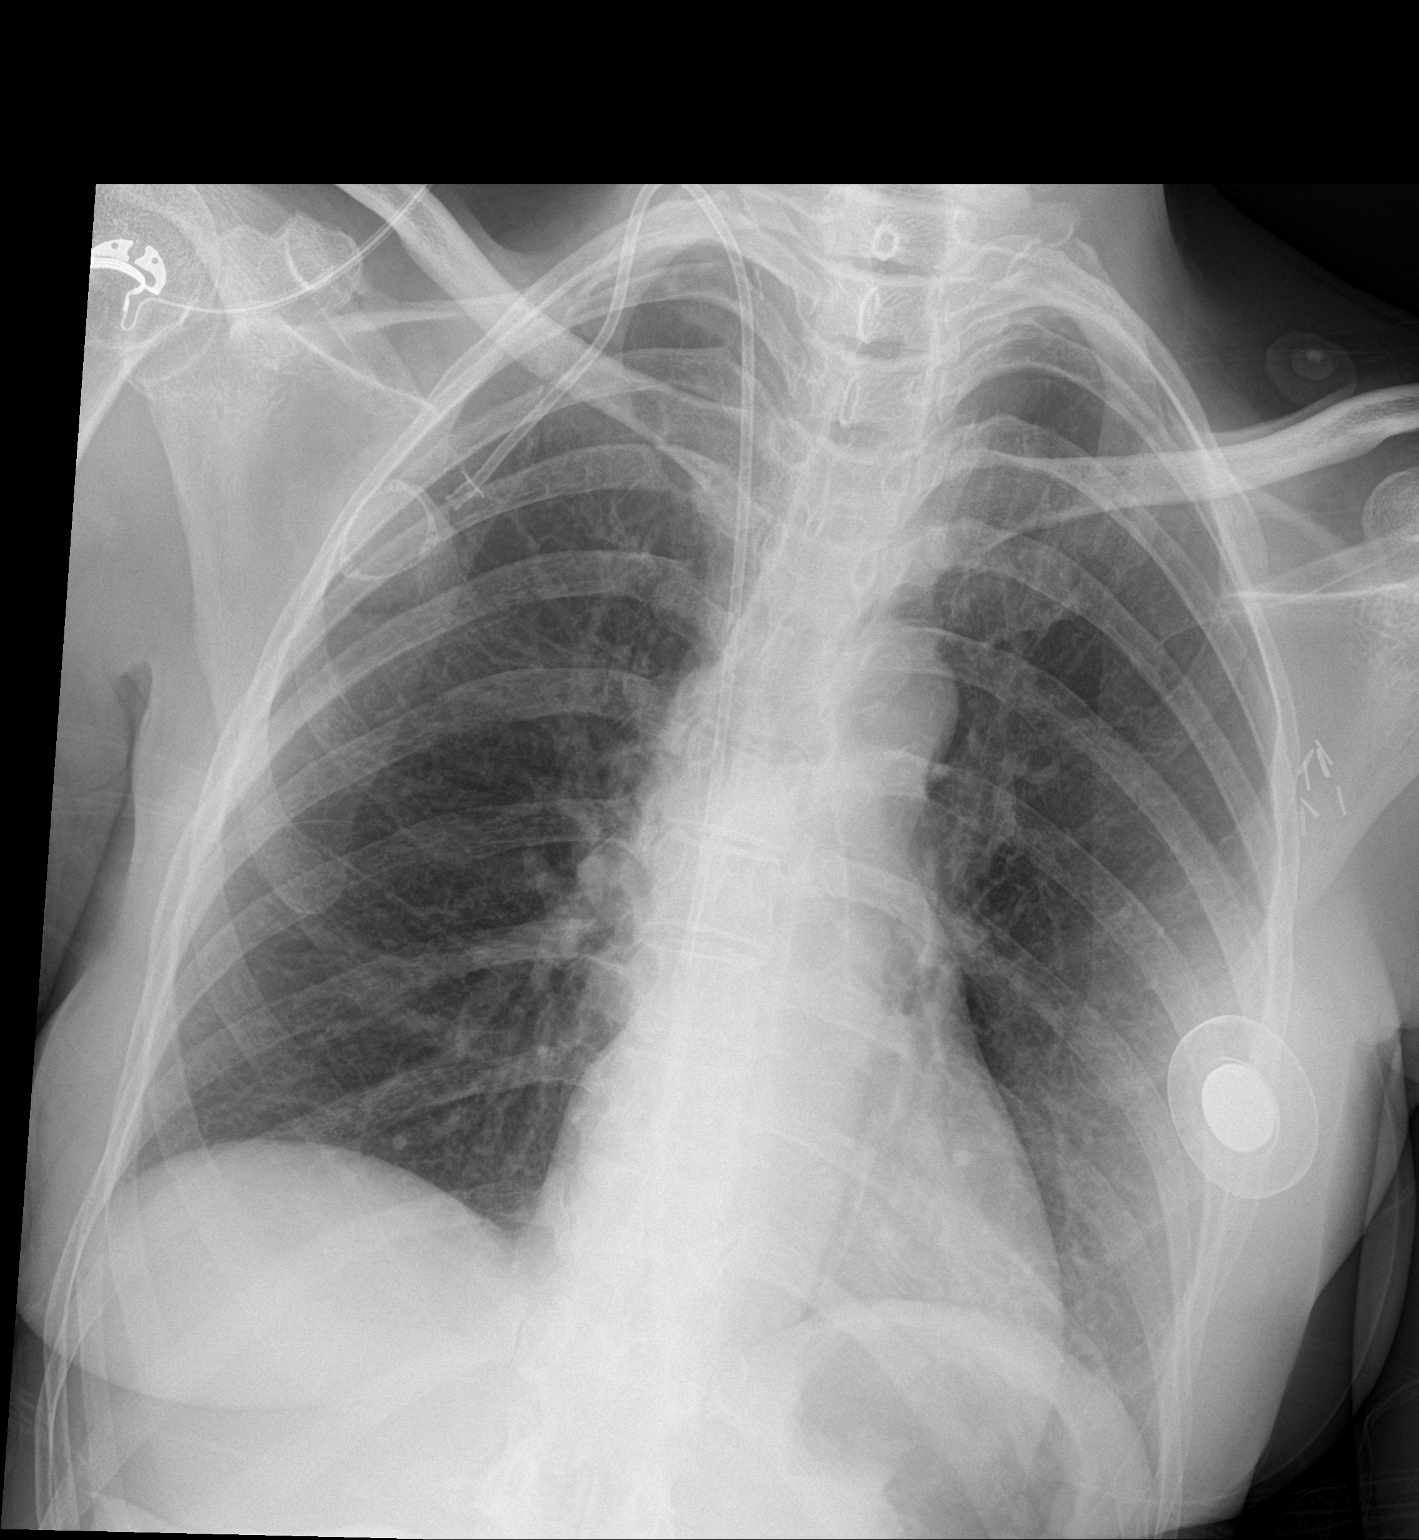

[1 of 1 positions shown; findings below may reference images not displayed]

FINDINGS: Right IJ power port tip in good position in the distal SVC near the
cavoatrial junction. No complicating features.

The cardiac silhouette, mediastinal and hilar contours are within
normal limits. Evidence of left breast surgery with a tissue
expander in place. Surgical clips noted in the left axilla. No
significant bony findings.
IMPRESSION: Right IJ power port in good position without complicating features.

## 2020-03-24 ENCOUNTER — Ambulatory Visit (INDEPENDENT_AMBULATORY_CARE_PROVIDER_SITE_OTHER): Payer: BC Managed Care – PPO | Admitting: Nurse Practitioner

## 2020-03-24 ENCOUNTER — Encounter: Payer: Self-pay | Admitting: Nurse Practitioner

## 2020-03-24 ENCOUNTER — Other Ambulatory Visit: Payer: Self-pay

## 2020-03-24 VITALS — BP 132/80 | HR 89 | Temp 98.2°F | Ht 65.0 in | Wt 111.3 lb

## 2020-03-24 DIAGNOSIS — L539 Erythematous condition, unspecified: Secondary | ICD-10-CM

## 2020-03-24 DIAGNOSIS — L0291 Cutaneous abscess, unspecified: Secondary | ICD-10-CM

## 2020-03-24 MED ORDER — DOXYCYCLINE HYCLATE 100 MG PO TABS: 100.0000 mg | ORAL_TABLET | Freq: Two times a day (BID) | ORAL | 0 refills | Status: DC

## 2020-03-24 NOTE — Progress Notes (Signed)
Acute Office Visit  Subjective:    Patient ID: Heather [name], female    DOB: 1961/01/03, 59 y.o.   MRN: [id number]  Chief Complaint  Patient presents with  . Rash    left forearm, first noticed rash 2d ago, wsa red, swollen, has improved overnight  . Abscess    left thumb, appeared 1 wk ago, looked like it was coming to a head and she started messing with it    HPI Heather [name] is a 59 year old female presenting today for a pustular lesion located on the PIP of her left thumb.  She first noticed the lesion approximately 1 week ago.  She reports the area appeared to be coming to ahead and she tried to squeeze it however it did not begin to drain.  She reports the area has been extremely tender and warm to the touch.  She does report that today it looks like it has gone down in size.  She also reports a edematous and erythematous area on her left forearm midway between the wrist and the elbow on the anterior side.  She reports this has been present for approximately 2 days.  She reports it is significantly improved today however yesterday it was quite swollen and red.  She reports the area is nontender with movement or palpation.  She denies any known injury to the area.  Past Medical History:  Diagnosis Date  . Cancer [facility])    Breast Cancer 09/2018  . Family history of breast cancer   . Hypertension     Past Surgical History:  Procedure Laterality Date  . BREAST RECONSTRUCTION WITH PLACEMENT OF TISSUE EXPANDER AND ALLODERM Left 11/13/2018   Procedure: LEFT BREAST RECONSTRUCTION WITH PLACEMENT OF TISSUE EXPANDER AND ALLODERM;  Surgeon: Irene Limbo, MD;  Location: [place];  Service: Plastics;  Laterality: Left;  . CESAREAN SECTION    . PORT-A-CATH REMOVAL Right 08/26/2019   Procedure: REMOVAL PORT-A-CATH;  Surgeon: Rolm Bookbinder, MD;  Location: [place];  Service: General;  Laterality: Right;  . PORTACATH PLACEMENT Right 12/11/2018   Procedure: INSERTION  PORT-A-CATH WITH ULTRASOUND;  Surgeon: Rolm Bookbinder, MD;  Location: [place];  Service: General;  Laterality: Right;  . TUBAL LIGATION      Family History  Problem Relation Age of Onset  . Hypertension Mother   . Diabetes Mother   . Hypertension Sister   . Breast cancer Maternal Aunt 90  . Breast cancer Cousin 69       mat first cousin  . Heart attack Father 28    Social History   Socioeconomic History  . Marital status: Married    Spouse name: Not on file  . Number of children: Not on file  . Years of education: Not on file  . Highest education level: Not on file  Occupational History  . Not on file  Tobacco Use  . Smoking status: Former Smoker    Types: Cigarettes    Quit date: 08/03/2018    Years since quitting: 1.6  . Smokeless tobacco: Never Used  . Tobacco comment: off and on for about 10-15 years.   Vaping Use  . Vaping Use: Never used  Substance and Sexual Activity  . Alcohol use: Yes    Comment: rarely  . Drug use: No  . Sexual activity: Not on file  Other Topics Concern  . Not on file  Social History Narrative  . Not on file   Social Determinants of Health  Financial Resource Strain:   . Difficulty of Paying Living Expenses:   Food Insecurity:   . Worried About Charity fundraiser in the Last Year:   . Arboriculturist in the Last Year:   Transportation Needs: No Transportation Needs  . Lack of Transportation (Medical): No  . Lack of Transportation (Non-Medical): No  Physical Activity:   . Days of Exercise per Week:   . Minutes of Exercise per Session:   Stress:   . Feeling of Stress :   Social Connections:   . Frequency of Communication with Friends and Family:   . Frequency of Social Gatherings with Friends and Family:   . Attends Religious Services:   . Active Member of Clubs or Organizations:   . Attends Archivist Meetings:   Marland Kitchen Marital Status:   Intimate Partner Violence: Not At Risk  . Fear of Current  or Ex-Partner: No  . Emotionally Abused: No  . Physically Abused: No  . Sexually Abused: No    Outpatient Medications Prior to Visit  Medication Sig Dispense Refill  . anastrozole (ARIMIDEX) 1 MG tablet Take 1 tablet (1 mg total) by mouth daily. 90 tablet 3  . lisinopril-hydrochlorothiazide (ZESTORETIC) 10-12.5 MG tablet Take 1 tablet by mouth daily. 90 tablet 1   Facility-Administered Medications Prior to Visit  Medication Dose Route Frequency Provider Last Rate Last Admin  . sodium chloride flush (NS) 0.9 % injection 10 mL  10 mL Intravenous PRN Nicholas Lose, MD   10 mL at 12/17/18 0907    No Known Allergies  Review of Systems  Constitutional: Negative for activity change, appetite change, chills, fatigue and fever.  Respiratory: Negative for chest tightness and shortness of breath.   Cardiovascular: Negative for chest pain, palpitations and leg swelling.  Skin: Positive for color change and wound.  Neurological: Negative for dizziness, weakness and light-headedness.  Hematological: Negative for adenopathy. Does not bruise/bleed easily.       Objective:    Physical Exam Constitutional:      Appearance: Normal appearance. She is normal weight.  HENT:     Head: Normocephalic.  Eyes:     Pupils: Pupils are equal, round, and reactive to light.  Cardiovascular:     Rate and Rhythm: Normal rate.     Pulses: Normal pulses.  Pulmonary:     Effort: Pulmonary effort is normal.     Breath sounds: Normal breath sounds.  Abdominal:     General: Abdomen is flat.  Musculoskeletal:        General: Normal range of motion.     Cervical back: Normal range of motion.  Skin:    General: Skin is warm and dry.     Capillary Refill: Capillary refill takes less than 2 seconds.     Findings: Erythema, lesion and rash present. Rash is macular.       Neurological:     General: No focal deficit present.     Mental Status: She is alert and oriented to person, place, and time.     Motor:  No weakness.  Psychiatric:        Mood and Affect: Mood normal.        Behavior: Behavior normal.        Thought Content: Thought content normal.        Judgment: Judgment normal.     BP 132/80   Pulse 89   Temp 98.2 F (36.8 C) (Oral)   Ht 5\' 5"  (1.651  m)   Wt 111 lb 4.8 oz (50.5 kg)   SpO2 98%   BMI 18.52 kg/m  Wt Readings from Last 3 Encounters:  03/24/20 111 lb 4.8 oz (50.5 kg)  08/26/19 112 lb 14 oz (51.2 kg)  07/22/19 112 lb 8 oz (51 kg)    There are no preventive care reminders to display for this patient.  There are no preventive care reminders to display for this patient.   Lab Results  Component Value Date   TSH 1.05 12/04/2018   Lab Results  Component Value Date   WBC 7.3 04/22/2019   HGB 10.3 (L) 04/22/2019   HCT 33.6 (L) 04/22/2019   MCV 87.5 04/22/2019   PLT 314 04/22/2019   Lab Results  Component Value Date   NA 140 08/22/2019   K 4.0 08/22/2019   CO2 28 08/22/2019   GLUCOSE 107 (H) 08/22/2019   BUN 15 08/22/2019   CREATININE 0.91 08/22/2019   BILITOT 0.3 04/22/2019   ALKPHOS 64 04/22/2019   AST 37 04/22/2019   ALT 31 04/22/2019   PROT 6.0 (L) 04/22/2019   ALBUMIN 3.8 04/22/2019   CALCIUM 9.5 08/22/2019   ANIONGAP 8 08/22/2019   Lab Results  Component Value Date   CHOL 201 (H) 12/04/2018   Lab Results  Component Value Date   HDL 49 (L) 12/04/2018   Lab Results  Component Value Date   LDLCALC 136 (H) 12/04/2018   Lab Results  Component Value Date   TRIG 69 12/04/2018   Lab Results  Component Value Date   CHOLHDL 4.1 12/04/2018   Lab Results  Component Value Date   HGBA1C 5.8 (H) 12/04/2018       Assessment & Plan:  1. Abscess Abscessed area to the left thumb.  Incision and drainage performed in the office today.  Patient provided with instructions to keep area covered with a clean bandage and to utilize antibacterial ointment to the area for the next 3 days.  Prescription for doxycycline for the next 5 days provided  for infection.  It is unclear if the area started out as a possible foreign object or insect bite.  No foreign object was clearly detected during incision and drainage.  PROCEDURE Area was cleansed with chlorhexidine swab in a circular motion from the inside out. 0.2 cc of 1% lidocaine utilized to infiltrate the tissue surrounding the abscess. #10 blade utilized to remove the excess top layer of skin. Purulent material drained from abscess utilizing gentle pressure. Area was inspected for the presence of a foreign object, of which none were found. Surrounding tissue was cleansed with chlorhexidine swab once again. Antibiotic ointment and sterile clean bandage placed over the wound. Patient tolerated procedure well.  - doxycycline (VIBRA-TABS) 100 MG tablet; Take 1 tablet (100 mg total) by mouth 2 (two) times daily.  Dispense: 10 tablet; Refill: 0 dispense: 10 tablet; Refill: 0  2. Erythema Erythema to left anterior forearm of an unknown etiology.  It is possible the infectious process from the thumb spread up the arm creating the erythema and edema noted by the patient yesterday.  No pain, tenderness, or edema to the area today.  Instructed the patient to notify the office if the area once again becomes inflamed, if she begins to experience any tenderness or pain to the area, or if symptoms worsen.  Oral doxycycline provided for the abscess which should cover the forearm area if infection is present.  Follow-up if symptoms worsen or fail to improve.  Orma Render, NP

## 2020-03-24 NOTE — Patient Instructions (Signed)
Skin Abscess  A skin abscess is an infected area on or under your skin that contains a collection of pus and other material. An abscess may also be called a furuncle, carbuncle, or boil. An abscess can occur in or on almost any part of your body. Some abscesses break open (rupture) on their own. Most continue to get worse unless they are treated. The infection can spread deeper into the body and eventually into your blood, which can make you feel ill. Treatment usually involves draining the abscess. What are the causes? An abscess occurs when germs, like bacteria, pass through your skin and cause an infection. This may be caused by:  A scrape or cut on your skin.  A puncture wound through your skin, including a needle injection or insect bite.  Blocked oil or sweat glands.  Blocked and infected hair follicles.  A cyst that forms beneath your skin (sebaceous cyst) and becomes infected. What increases the risk? This condition is more likely to develop in people who:  Have a weak body defense system (immune system).  Have diabetes.  Have dry and irritated skin.  Get frequent injections or use illegal IV drugs.  Have a foreign body in a wound, such as a splinter.  Have problems with their lymph system or veins. What are the signs or symptoms? Symptoms of this condition include:  A painful, firm bump under the skin.  A bump with pus at the top. This may break through the skin and drain. Other symptoms include:  Redness surrounding the abscess site.  Warmth.  Swelling of the lymph nodes (glands) near the abscess.  Tenderness.  A sore on the skin. How is this diagnosed? This condition may be diagnosed based on:  A physical exam.  Your medical history.  A sample of pus. This may be used to find out what is causing the infection.  Blood tests.  Imaging tests, such as an ultrasound, CT scan, or MRI. How is this treated? A small abscess that drains on its own may  not need treatment. Treatment for larger abscesses may include:  Moist heat or heat pack applied to the area several times a day.  A procedure to drain the abscess (incision and drainage).  Antibiotic medicines. For a severe abscess, you may first get antibiotics through an IV and then change to antibiotics by mouth. Follow these instructions at home: Medicines   Take over-the-counter and prescription medicines only as told by your health care provider.  If you were prescribed an antibiotic medicine, take it as told by your health care provider. Do not stop taking the antibiotic even if you start to feel better. Abscess care   If you have an abscess that has not drained, apply heat to the affected area. Use the heat source that your health care provider recommends, such as a moist heat pack or a heating pad. ? Place a towel between your skin and the heat source. ? Leave the heat on for 20-30 minutes. ? Remove the heat if your skin turns bright red. This is especially important if you are unable to feel pain, heat, or cold. You may have a greater risk of getting burned.  Follow instructions from your health care provider about how to take care of your abscess. Make sure you: ? Cover the abscess with a bandage (dressing). ? Change your dressing or gauze as told by your health care provider. ? Wash your hands with soap and water before you change the   dressing or gauze. If soap and water are not available, use hand sanitizer.  Check your abscess every day for signs of a worsening infection. Check for: ? More redness, swelling, or pain. ? More fluid or blood. ? Warmth. ? More pus or a bad smell. General instructions  To avoid spreading the infection: ? Do not share personal care items, towels, or hot tubs with others. ? Avoid making skin contact with other people.  Keep all follow-up visits as told by your health care provider. This is important. Contact a health care provider if  you have:  More redness, swelling, or pain around your abscess.  More fluid or blood coming from your abscess.  Warm skin around your abscess.  More pus or a bad smell coming from your abscess.  A fever.  Muscle aches.  Chills or a general ill feeling. Get help right away if you:  Have severe pain.  See red streaks on your skin spreading away from the abscess. Summary  A skin abscess is an infected area on or under your skin that contains a collection of pus and other material.  A small abscess that drains on its own may not need treatment.  Treatment for larger abscesses may include having a procedure to drain the abscess and taking an antibiotic. This information is not intended to replace advice given to you by your health care provider. Make sure you discuss any questions you have with your health care provider. Document Revised: 01/10/2019 Document Reviewed: 11/02/2017 Elsevier Patient Education  2020 [employer]  

## 2020-08-05 ENCOUNTER — Other Ambulatory Visit: Payer: Self-pay | Admitting: Osteopathic Medicine

## 2020-08-05 DIAGNOSIS — I10 Essential (primary) hypertension: Secondary | ICD-10-CM

## 2020-09-04 ENCOUNTER — Ambulatory Visit: Payer: BC Managed Care – PPO | Attending: Family

## 2020-09-04 DIAGNOSIS — Z23 Encounter for immunization: Secondary | ICD-10-CM

## 2020-09-28 ENCOUNTER — Other Ambulatory Visit: Payer: Self-pay | Admitting: Hematology and Oncology

## 2020-09-28 ENCOUNTER — Telehealth: Payer: Self-pay | Admitting: Hematology and Oncology

## 2020-09-28 NOTE — Telephone Encounter (Signed)
Scheduled follow-up appointment per 12/27 schedule message. Patient is aware. 

## 2020-11-11 ENCOUNTER — Other Ambulatory Visit: Payer: Self-pay | Admitting: Osteopathic Medicine

## 2020-11-11 DIAGNOSIS — I10 Essential (primary) hypertension: Secondary | ICD-10-CM

## 2020-12-15 ENCOUNTER — Other Ambulatory Visit: Payer: Self-pay | Admitting: Osteopathic Medicine

## 2020-12-15 DIAGNOSIS — I10 Essential (primary) hypertension: Secondary | ICD-10-CM

## 2020-12-23 NOTE — Assessment & Plan Note (Signed)
11/13/2018:Left mastectomy: Grade 2 IDC, 1.6 cm, with intermediate grade DCIS, margins negative, lymphovascular invasion present, 1/7 lymph nodes positive, MammaPrint high risk, ER 100%, PR 30%, HER-2 negative, Ki-67 30% T1CN1A stage Ia  Treatment plan: 1.Adjuvant chemotherapy with dose since he remains on Cytoxan x4 followed by Taxol weekly x10 completed 04/22/2019  2.Followed by adjuvant radiation therapy 06/06/2019-07/19/2019 3.Followed by adjuvant antiestrogen therapy ------------------------------------------------------------------------------------------------------------------ Treatment plan: Adjuvant antiestrogen therapy with anastrozole 1 mg p.o. daily  Anastrozole Toxicities:  Breast Cancer Surveillance: 1. Mammograms 01/17/20: Benign 2/ Breast Exam: 12/24/20: Benign 3. Bone Density 01/17/20: T score -1.1

## 2020-12-23 NOTE — Progress Notes (Signed)
Patient Care Team: Emeterio Reeve, DO as PCP - General (Osteopathic Medicine) Rolm Bookbinder, MD as Consulting Physician (General Surgery) Nicholas Lose, MD as Consulting Physician (Hematology and Oncology) Eppie Gibson, MD as Attending Physician (Radiation Oncology)  DIAGNOSIS:    ICD-10-CM   1. Malignant neoplasm of upper-outer quadrant of left breast in female, estrogen receptor positive ([place])  C50.412    Z17.0     SUMMARY OF ONCOLOGIC HISTORY: Oncology History  Malignant neoplasm of upper-outer quadrant of left breast in female, estrogen receptor positive ([place])  09/12/2018 Initial Diagnosis   Palpable left breast mass at 2:30 position by ultrasound measured 1.6 cm, calcifications spanning 5.9 cm, 3 abnormal lymph nodes, multiple masses and calcifications biopsy of the mass revealed grade 2 IDC plus DCIS with lymphovascular invasion, ER 100%, PR 60%, Ki-67 30%, HER-2 negative IHC 1+, pathology of calcifications is pending, biopsy of lymph nodes is discordant benign, T1cN0 stage Ia   09/19/2018 Cancer Staging   Staging form: Breast, AJCC 8th Edition - Clinical stage from 09/19/2018: Stage IA (cT1c(m), cN0, cM0, G2, ER+, PR+, HER2-) - Signed by Nicholas Lose, MD on 09/19/2018   09/2018 - 09/2025 Anti-estrogen oral therapy   Letrozole 2.5 mg and discontinued in 12/2018. Anastrozole 1 mg started 07/2019.    11/13/2018 Surgery   Left mastectomy Donne Hazel) [phone]): Grade 2 IDC, 1.6 cm, with intermediate grade DCIS, margins negative, lymphovascular invasion present, 1/7 lymph nodes positive, MammaPrint high risk, ER 100%, PR 30%, HER-2 negative, Ki-67 30% T1CN1A stage Ia   11/13/2018 Cancer Staging   Staging form: Breast, AJCC 8th Edition - Pathologic stage from 11/13/2018: Stage IA (pT1c, pN1a, cM0, G2, ER+, PR+, HER2-) - Signed by Gardenia Phlegm, NP on 11/28/2018   11/13/2018 Oncotype testing   MammaPrint results reveal a high risk luminal type-B with a 94.6%  predicted benefit of treatment at 5 years with both chemotherapy and hormonal therapy.     06/06/2019 - 07/23/2019 Radiation Therapy   The patient initially received a dose of 50.4 Gy in 28 fractions to the breast and 50.4 Gy in 28 fractions to the left supraclavicular region using whole-breast tangent fields. This was delivered using a 3-D conformal technique. The pt received a boost delivering an additional 10 Gy in 5 fractions using a electron boost with 50mV electrons. The total dose was 110.8 Gy.     Chemotherapy   The patient had DOXOrubicin (ADRIAMYCIN) chemo injection 92 mg, 60 mg/m2 = 92 mg, Intravenous,  Once, 4 of 4 cycles  Administration: 92 mg (12/17/2018), 92 mg (12/31/2018), 92 mg (01/14/2019), 92 mg (01/28/2019)    palonosetron (ALOXI) injection 0.25 mg, 0.25 mg, Intravenous,  Once, 4 of 4 cycles  Administration: 0.25 mg (12/17/2018), 0.25 mg (12/31/2018), 0.25 mg (01/14/2019), 0.25 mg (01/28/2019)    pegfilgrastim (NEULASTA) injection 6 mg, 6 mg, Subcutaneous, Once, 2 of 2 cycles  Administration: 6 mg (01/16/2019), 6 mg (01/30/2019)    pegfilgrastim (NEULASTA ONPRO KIT) injection 6 mg, 6 mg, Subcutaneous, Once, 1 of 1 cycle    pegfilgrastim-cbqv (UDENYCA) injection 6 mg, 6 mg, Subcutaneous, Once, 2 of 2 cycles  Administration: 6 mg (12/19/2018), 6 mg (01/02/2019)    cyclophosphamide (CYTOXAN) 920 mg in sodium chloride 0.9 % 250 mL chemo infusion, 600 mg/m2 = 920 mg, Intravenous,  Once, 4 of 4 cycles  Administration: 920 mg (12/17/2018), 920 mg (12/31/2018), 920 mg (01/14/2019), 920 mg (01/28/2019)    PACLitaxel (TAXOL) 120 mg in sodium chloride 0.9 % 250 mL chemo infusion (</=  98m/m2), 80 mg/m2 = 120 mg, Intravenous,  Once, 10 of 12 cycles  Dose modification: 60 mg/m2 (original dose 80 mg/m2, Cycle 12, Reason: Dose not tolerated)  Administration: 120 mg (02/11/2019), 120 mg (02/18/2019), 120 mg (02/26/2019), 120 mg (03/04/2019), 120 mg (03/18/2019), 120 mg (03/25/2019), 120 mg  (04/01/2019), 90 mg (04/08/2019), 90 mg (04/15/2019), 90 mg (04/22/2019)    fosaprepitant (EMEND) 150 mg, dexamethasone (DECADRON) 12 mg in sodium chloride 0.9 % 145 mL IVPB, , Intravenous,  Once, 4 of 4 cycles  Administration:  (12/17/2018),  (12/31/2018),  (01/14/2019),  (01/28/2019)  for chemotherapy treatment.       CHIEF COMPLIANT: Follow-up of left breast cancer on anastrozole   INTERVAL HISTORY: Romesha VConna Teradais a 60y.o. with above-mentioned history of left breast cancer treated with mastectomy, adjuvant chemotherapy, radiation, and who is currently on antiestrogen therapy with anastrozole. Mammogram on 01/17/20 showed no evidence of malignancy bilaterally. She presents to the clinic today for follow-up.  She is tolerating anastrozole fairly well without any major problems or concerns.  She continues to have mild peripheral neuropathy in her feet but it is not affecting her quality of life.  ALLERGIES:  has No Known Allergies.  MEDICATIONS:  Current Outpatient Medications  Medication Sig Dispense Refill  . lisinopril-hydrochlorothiazide (ZESTORETIC) 10-12.5 MG tablet TAKE 1 TABLET BY MOUTH EVERY DAY 30 tablet 0  . anastrozole (ARIMIDEX) 1 MG tablet Take 1 tablet (1 mg total) by mouth daily. 90 tablet 3   No current facility-administered medications for this visit.   Facility-Administered Medications Ordered in Other Visits  Medication Dose Route Frequency Provider Last Rate Last Admin  . sodium chloride flush (NS) 0.9 % injection 10 mL  10 mL Intravenous PRN GNicholas Lose MD   10 mL at 12/17/18 0907    PHYSICAL EXAMINATION: ECOG PERFORMANCE STATUS: 1 - Symptomatic but completely ambulatory  Vitals:   12/24/20 0824  BP: (!) 149/77  Pulse: 95  Resp: 17  Temp: 98.2 F (36.8 C)  SpO2: 100%   Filed Weights   12/24/20 0824  Weight: 118 lb 3.2 oz (53.6 kg)    BREAST: No palpable masses or nodules in either right or left breasts. No palpable axillary supraclavicular or  infraclavicular adenopathy no breast tenderness or nipple discharge. (exam performed in the presence of a chaperone)  LABORATORY DATA:  I have reviewed the data as listed CMP Latest Ref Rng & Units 08/22/2019 04/22/2019 04/15/2019  Glucose 70 - 99 mg/dL 107(H) 109(H) 88  BUN 6 - 20 mg/dL _0 Creatinine 0.44 - 1.00 mg/dL 0.91 0.85 0.70  Sodium 135 - 145 mmol/L 140 142 141  Potassium 3.5 - 5.1 mmol/L 4.0 3.6 4.0  Chloride 98 - 111 mmol/L 104 107 109  CO2 22 - 32 mmol/L _1 Calcium 8.9 - 10.3 mg/dL 9.5 8.8(L) 9.0  Total Protein 6.5 - 8.1 g/dL - 6.0(L) 6.0(L)  Total Bilirubin 0.3 - 1.2 mg/dL - 0.3 0.3  Alkaline Phos 38 - 126 U/L - 64 62  AST 15 - 41 U/L - 37 41  ALT 0 - 44 U/L - 31 30    Lab Results  Component Value Date   WBC 7.3 04/22/2019   HGB 10.3 (L) 04/22/2019   HCT 33.6 (L) 04/22/2019   MCV 87.5 04/22/2019   PLT 314 04/22/2019   NEUTROABS 5.9 04/22/2019    ASSESSMENT & PLAN:  Malignant neoplasm of upper-outer quadrant of left breast in female, estrogen receptor positive (  [place]) 11/13/2018:Left mastectomy: Grade 2 IDC, 1.6 cm, with intermediate grade DCIS, margins negative, lymphovascular invasion present, 1/7 lymph nodes positive, MammaPrint high risk, ER 100%, PR 30%, HER-2 negative, Ki-67 30% T1CN1A stage Ia  Treatment plan: 1.Adjuvant chemotherapy with dose since he remains on Cytoxan x4 followed by Taxol weekly x10 completed 04/22/2019  2.Followed by adjuvant radiation therapy 06/06/2019-07/19/2019 3.Followed by adjuvant antiestrogen therapy ------------------------------------------------------------------------------------------------------------------ Treatment plan: Adjuvant antiestrogen therapy with anastrozole 1 mg p.o. daily  Anastrozole Toxicities: Denies any major hot flashes or myalgias.  Breast Cancer Surveillance: 1. Mammograms 01/17/20: Benign 2/ Breast Exam: 12/24/20: Benign 3. 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  Return to clinic in 1 year for follow-up  No orders of the defined types were placed in this encounter.  The patient has a good understanding of the overall plan. she agrees with it. she will call with any problems that may develop before the next visit here.  Total time spent: 20 mins including face to face time and time spent for planning, charting and coordination of care  Rulon Eisenmenger, MD, MPH 12/24/2020  I, Cloyde Reams Dorshimer, am acting as scribe for Dr. Nicholas Lose.  I have reviewed the above documentation for accuracy and completeness, and I agree with the above.

## 2020-12-24 ENCOUNTER — Inpatient Hospital Stay: Payer: BC Managed Care – PPO | Attending: Hematology and Oncology | Admitting: Hematology and Oncology

## 2020-12-24 ENCOUNTER — Telehealth: Payer: Self-pay | Admitting: Hematology and Oncology

## 2020-12-24 ENCOUNTER — Other Ambulatory Visit: Payer: Self-pay

## 2020-12-24 DIAGNOSIS — Z79899 Other long term (current) drug therapy: Secondary | ICD-10-CM | POA: Insufficient documentation

## 2020-12-24 DIAGNOSIS — Z170 Estrogen receptor positive status [ER+]: Secondary | ICD-10-CM | POA: Diagnosis not present

## 2020-12-24 DIAGNOSIS — Z9012 Acquired absence of left breast and nipple: Secondary | ICD-10-CM | POA: Insufficient documentation

## 2020-12-24 DIAGNOSIS — Z79811 Long term (current) use of aromatase inhibitors: Secondary | ICD-10-CM | POA: Diagnosis not present

## 2020-12-24 DIAGNOSIS — C50412 Malignant neoplasm of upper-outer quadrant of left female breast: Secondary | ICD-10-CM

## 2020-12-24 DIAGNOSIS — G629 Polyneuropathy, unspecified: Secondary | ICD-10-CM | POA: Diagnosis not present

## 2020-12-24 MED ORDER — ANASTROZOLE 1 MG PO TABS: 1.0000 mg | ORAL_TABLET | Freq: Every day | ORAL | 3 refills | Status: DC

## 2020-12-24 NOTE — Telephone Encounter (Signed)
Scheduled appts per 3/23 los. Pt aware.  

## 2020-12-30 NOTE — Progress Notes (Signed)
   Covid-19 Vaccination Clinic  Name:  Verner Kopischke    MRN: [id number] DOB: [birth year]  12/30/2020  Ms. Ament was observed post Covid-19 immunization for 15 minutes without incident. She was provided with Vaccine Information Sheet and instruction to access the V-Safe system.   Ms. Barlowe was instructed to call 911 with any severe reactions post vaccine: Marland Kitchen Difficulty breathing  . Swelling of face and throat  . A fast heartbeat  . A bad rash all over body  . Dizziness and weakness   Immunizations Administered    Name Date Dose VIS Date Route   Moderna Covid-19 Booster Vaccine 09/04/2020 11:45 AM 0.25 mL 07/22/2020 Intramuscular   Manufacturer: Moderna   Lot: [id number]   [place]: [phone]

## 2021-01-18 ENCOUNTER — Other Ambulatory Visit: Payer: Self-pay | Admitting: Osteopathic Medicine

## 2021-01-18 DIAGNOSIS — I10 Essential (primary) hypertension: Secondary | ICD-10-CM

## 2021-02-10 NOTE — H&P (Signed)
Subjective:     Patient ID: Heather [name] is a 60 y.o. female.  HPI  2 years post op left mastectomy with immediate expander based reconstruction.Scheduled for implant exchange this month.  Presented with palpable left breast mass. MMG/US showed multiple masses with associated calcifications along the 2-3 o'clock position of the left breast. The largest mass corresponds to the palpable abnormality, measuring 1.6 x 1.0 x 1.5 cm. In the left axilla, there were several LN with mild thickened cortices.  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 Final pathology two foci carcinoma, largest 1.6 cm IDC with DCIS, + LVI, 1/7 SLN+  Mammaprint high risk. Completed adjuvant chemotherapy and radiation, latter completed 07/23/19. On anastrozole.  Prior 34 B, fine with this. 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  Review of Systems    Objective:   Physical Exam  Cardiovascular: Normal rate, regular rhythm and normal heart sounds.  Pulmonary/Chest: Effort normal and breath sounds normal.   Chest:left chest expanded soft Left chest rippling present Right chest pseudoptosis present, left reconstruction no ptosis SN to nipple R 19 L 18 cm BR R 15 cm Nipple to IMF R 7 L 7 cm  Assessment:     Left breast ca UOQ ER+ S/p left NSM, SLN, prepectoral TE/ADM reconstruction (Alloderm) Adjuvant chemotherapy, radiation    Plan:     Plan removal left chest tissue expander and placement silicone implant. Patient is due for right breast MMG-  Reviewed radiation increases risks reconstruction including wound healing problems (in setting implant based reconstruction this can mean implant exposure or extrusion) and capsular contracture. Counseled encouraging data with prepectoral position, use ADM with regards to  radiation. Howeversome type of autologous tissue reconstruction post radiation can decrease risks back to baseline.Counseled options of implant exchange alone,LD flap over implantonleft, or referral to microsurgeon to discuss other autologous options.Reviewed donor site risks including weakness, seroma with LD flap. She has minimal other areas for donor site for reconstruction. Reviewed hospital stay, drains, recovery for each. Patient declines flap surgery. Reviewed any complications with implant exchange alone may mean removal device and starting reconstruction process over with LD flap and TE.  As in prepectoral position recommend silicone implants , HCG or capacity filled implants. Reviewed saline vs silicone, shaped vs round.HCG or capacity filled silicone implants may offer reduced risk visible rippling. Reviewed MRI or USsurveillance for rupture with silicone implants. Reviewed examples for 4th generation, capacity filled 4th generation, and HCG implants vs saline implants. Reviewed risks AP flipping that may be more noticeable with 5th generation implants, may require surgery to correct.Reviewed textured vs smooth, former associated with ALCL risk.Reviewed size in part guided by width chest, cannot assure her cup size. Patient agrees for silicone. Plan smooth round.   Can consider right augmentation as well for symmetry- counseled left side implant based reconstruction will always have more upper pole fullness than natural right breast, right augmentation would provide more symmetric appearance. Patient declines this. Reviewed patient has asymmetry nipple position and I do not anticipate this will change with implant placement.  Additional risks including but not limited to infection, asymmetry, unacceptable cosmetic result, seroma, hematoma, bleeding, damage to adjacent structures, need for additional procedures, blood clots in legs or lungs reviewed.  Plan OP surgery, no drains  anticipated. Rx for oxycodone and Bactrim given.  Completed Natrelle Desma Maxim  Reconstruction physician patient checklist.   Natrelle 133S [id number] T tissue expander placed,  fill volume 240 ml saline.

## 2021-02-16 ENCOUNTER — Encounter (HOSPITAL_BASED_OUTPATIENT_CLINIC_OR_DEPARTMENT_OTHER): Payer: Self-pay | Admitting: Plastic Surgery

## 2021-02-16 ENCOUNTER — Other Ambulatory Visit: Payer: Self-pay

## 2021-02-19 ENCOUNTER — Encounter (HOSPITAL_BASED_OUTPATIENT_CLINIC_OR_DEPARTMENT_OTHER)
Admission: RE | Admit: 2021-02-19 | Discharge: 2021-02-19 | Disposition: A | Discharge status: Home / Self Care | Payer: BC Managed Care – PPO | Source: Ambulatory Visit | Attending: Plastic Surgery | Admitting: Plastic Surgery

## 2021-02-19 DIAGNOSIS — Z01818 Encounter for other preprocedural examination: Secondary | ICD-10-CM | POA: Diagnosis not present

## 2021-02-19 LAB — BASIC METABOLIC PANEL
Anion gap: 6 (ref 5–15)
BUN: 12 mg/dL (ref 6–20)
CO2: 28 mmol/L (ref 22–32)
Calcium: 9.6 mg/dL (ref 8.9–10.3)
Chloride: 106 mmol/L (ref 98–111)
Creatinine, Ser: 0.89 mg/dL (ref 0.44–1.00)
GFR, Estimated: >60 mL/min (ref >60)
Glucose, Bld: 93 mg/dL (ref 70–99)
Potassium: 4.3 mmol/L (ref 3.5–5.1)
Sodium: 140 mmol/L (ref 135–145)

## 2021-02-19 NOTE — Progress Notes (Signed)

## 2021-02-22 ENCOUNTER — Other Ambulatory Visit: Payer: Self-pay | Admitting: Osteopathic Medicine

## 2021-02-22 DIAGNOSIS — I10 Essential (primary) hypertension: Secondary | ICD-10-CM

## 2021-02-23 ENCOUNTER — Encounter (HOSPITAL_BASED_OUTPATIENT_CLINIC_OR_DEPARTMENT_OTHER): Payer: Self-pay | Admitting: Plastic Surgery

## 2021-02-23 ENCOUNTER — Ambulatory Visit (HOSPITAL_BASED_OUTPATIENT_CLINIC_OR_DEPARTMENT_OTHER): Payer: BC Managed Care – PPO | Admitting: Anesthesiology

## 2021-02-23 ENCOUNTER — Other Ambulatory Visit: Payer: Self-pay

## 2021-02-23 ENCOUNTER — Encounter (HOSPITAL_BASED_OUTPATIENT_CLINIC_OR_DEPARTMENT_OTHER)
Admission: RE | Disposition: A | Discharge status: Home / Self Care | Payer: Self-pay | Source: Home / Self Care | Attending: Plastic Surgery

## 2021-02-23 ENCOUNTER — Ambulatory Visit (HOSPITAL_BASED_OUTPATIENT_CLINIC_OR_DEPARTMENT_OTHER)
Admission: RE | Admit: 2021-02-23 | Discharge: 2021-02-23 | Disposition: A | Discharge status: Home / Self Care | Payer: BC Managed Care – PPO | Attending: Plastic Surgery | Admitting: Plastic Surgery

## 2021-02-23 DIAGNOSIS — I10 Essential (primary) hypertension: Secondary | ICD-10-CM | POA: Insufficient documentation

## 2021-02-23 DIAGNOSIS — N6489 Other specified disorders of breast: Secondary | ICD-10-CM | POA: Diagnosis not present

## 2021-02-23 DIAGNOSIS — Z9012 Acquired absence of left breast and nipple: Secondary | ICD-10-CM | POA: Diagnosis not present

## 2021-02-23 DIAGNOSIS — Z421 Encounter for breast reconstruction following mastectomy: Secondary | ICD-10-CM | POA: Insufficient documentation

## 2021-02-23 DIAGNOSIS — Z923 Personal history of irradiation: Secondary | ICD-10-CM | POA: Diagnosis not present

## 2021-02-23 DIAGNOSIS — Z9221 Personal history of antineoplastic chemotherapy: Secondary | ICD-10-CM | POA: Insufficient documentation

## 2021-02-23 DIAGNOSIS — Z853 Personal history of malignant neoplasm of breast: Secondary | ICD-10-CM | POA: Diagnosis not present

## 2021-02-23 DIAGNOSIS — Z87891 Personal history of nicotine dependence: Secondary | ICD-10-CM | POA: Insufficient documentation

## 2021-02-23 HISTORY — PX: REMOVAL OF TISSUE EXPANDER AND PLACEMENT OF IMPLANT: SHX6457

## 2021-02-23 SURGERY — REMOVAL, TISSUE EXPANDER, BREAST, WITH IMPLANT INSERTION
Anesthesia: General | Site: Chest | Laterality: Left

## 2021-02-23 MED ORDER — MIDAZOLAM HCL 5 MG/5ML IJ SOLN
INTRAMUSCULAR | Status: DC | PRN
  Administered 2021-02-23: 2 mg via INTRAVENOUS

## 2021-02-23 MED ORDER — LACTATED RINGERS IV SOLN: INTRAVENOUS | Status: DC

## 2021-02-23 MED ORDER — CHLORHEXIDINE GLUCONATE CLOTH 2 % EX PADS: 6.0000 | MEDICATED_PAD | Freq: Once | CUTANEOUS | Status: DC

## 2021-02-23 MED ORDER — SODIUM CHLORIDE 0.9 % IV SOLN
INTRAVENOUS | Status: AC
  Filled 2021-02-23: qty 10

## 2021-02-23 MED ORDER — GABAPENTIN 300 MG PO CAPS
300.0000 mg | ORAL_CAPSULE | ORAL | Status: AC
  Administered 2021-02-23: 300 mg via ORAL

## 2021-02-23 MED ORDER — ONDANSETRON HCL 4 MG/2ML IJ SOLN
INTRAMUSCULAR | Status: DC | PRN
  Administered 2021-02-23: 4 mg via INTRAVENOUS

## 2021-02-23 MED ORDER — CEFAZOLIN SODIUM-DEXTROSE 2-4 GM/100ML-% IV SOLN
INTRAVENOUS | Status: AC
  Filled 2021-02-23: qty 100

## 2021-02-23 MED ORDER — PROPOFOL 10 MG/ML IV BOLUS
INTRAVENOUS | Status: DC | PRN
  Administered 2021-02-23: 130 mg via INTRAVENOUS

## 2021-02-23 MED ORDER — FENTANYL CITRATE (PF) 100 MCG/2ML IJ SOLN: 25.0000 ug | INTRAMUSCULAR | Status: DC | PRN

## 2021-02-23 MED ORDER — SUGAMMADEX SODIUM 200 MG/2ML IV SOLN
INTRAVENOUS | Status: DC | PRN
  Administered 2021-02-23: 200 mg via INTRAVENOUS

## 2021-02-23 MED ORDER — LIDOCAINE HCL (CARDIAC) PF 100 MG/5ML IV SOSY
PREFILLED_SYRINGE | INTRAVENOUS | Status: DC | PRN
  Administered 2021-02-23: 50 mg via INTRAVENOUS

## 2021-02-23 MED ORDER — EPHEDRINE SULFATE 50 MG/ML IJ SOLN
INTRAMUSCULAR | Status: DC | PRN
  Administered 2021-02-23: 20 mg via INTRAVENOUS
  Administered 2021-02-23: 10 mg via INTRAVENOUS
  Administered 2021-02-23 (×2): 20 mg via INTRAVENOUS

## 2021-02-23 MED ORDER — FENTANYL CITRATE (PF) 100 MCG/2ML IJ SOLN
INTRAMUSCULAR | Status: DC | PRN
  Administered 2021-02-23: 100 ug via INTRAVENOUS

## 2021-02-23 MED ORDER — GABAPENTIN 300 MG PO CAPS
ORAL_CAPSULE | ORAL | Status: AC
  Filled 2021-02-23: qty 1

## 2021-02-23 MED ORDER — SCOPOLAMINE 1 MG/3DAYS TD PT72
MEDICATED_PATCH | TRANSDERMAL | Status: AC
  Filled 2021-02-23: qty 1

## 2021-02-23 MED ORDER — ONDANSETRON HCL 4 MG/2ML IJ SOLN
INTRAMUSCULAR | Status: AC
  Filled 2021-02-23: qty 2

## 2021-02-23 MED ORDER — SODIUM CHLORIDE 0.9 % IV SOLN: INTRAVENOUS | Status: DC | PRN

## 2021-02-23 MED ORDER — ACETAMINOPHEN 500 MG PO TABS
1000.0000 mg | ORAL_TABLET | ORAL | Status: AC
  Administered 2021-02-23: 1000 mg via ORAL

## 2021-02-23 MED ORDER — CEFAZOLIN SODIUM-DEXTROSE 2-4 GM/100ML-% IV SOLN
2.0000 g | INTRAVENOUS | Status: AC
  Administered 2021-02-23: 2 g via INTRAVENOUS

## 2021-02-23 MED ORDER — LIDOCAINE 2% (20 MG/ML) 5 ML SYRINGE
INTRAMUSCULAR | Status: AC
  Filled 2021-02-23: qty 5

## 2021-02-23 MED ORDER — BUPIVACAINE-EPINEPHRINE 0.25% -1:200000 IJ SOLN
INTRAMUSCULAR | Status: DC | PRN
  Administered 2021-02-23: 20 mL

## 2021-02-23 MED ORDER — PROPOFOL 10 MG/ML IV BOLUS
INTRAVENOUS | Status: AC
  Filled 2021-02-23: qty 20

## 2021-02-23 MED ORDER — FENTANYL CITRATE (PF) 100 MCG/2ML IJ SOLN
INTRAMUSCULAR | Status: AC
  Filled 2021-02-23: qty 2

## 2021-02-23 MED ORDER — ALBUMIN HUMAN 5 % IV SOLN
INTRAVENOUS | Status: AC
  Filled 2021-02-23: qty 500

## 2021-02-23 MED ORDER — MIDAZOLAM HCL 2 MG/2ML IJ SOLN
INTRAMUSCULAR | Status: AC
  Filled 2021-02-23: qty 2

## 2021-02-23 MED ORDER — DEXAMETHASONE SODIUM PHOSPHATE 10 MG/ML IJ SOLN
INTRAMUSCULAR | Status: AC
  Filled 2021-02-23: qty 1

## 2021-02-23 MED ORDER — CELECOXIB 200 MG PO CAPS
200.0000 mg | ORAL_CAPSULE | ORAL | Status: AC
  Administered 2021-02-23: 200 mg via ORAL

## 2021-02-23 MED ORDER — CELECOXIB 200 MG PO CAPS
ORAL_CAPSULE | ORAL | Status: AC
  Filled 2021-02-23: qty 1

## 2021-02-23 MED ORDER — ALBUMIN HUMAN 5 % IV SOLN: INTRAVENOUS | Status: DC | PRN

## 2021-02-23 MED ORDER — DEXAMETHASONE SODIUM PHOSPHATE 4 MG/ML IJ SOLN
INTRAMUSCULAR | Status: DC | PRN
  Administered 2021-02-23: 10 mg via INTRAVENOUS

## 2021-02-23 MED ORDER — SODIUM CHLORIDE 0.9 % IV SOLN
INTRAVENOUS | Status: DC | PRN
  Administered 2021-02-23 (×5): 200 ug via INTRAVENOUS

## 2021-02-23 MED ORDER — SODIUM CHLORIDE 0.9 % IV SOLN
INTRAVENOUS | Status: DC | PRN
  Administered 2021-02-23: 40 ug/min via INTRAVENOUS

## 2021-02-23 MED ORDER — SCOPOLAMINE 1 MG/3DAYS TD PT72
1.0000 | MEDICATED_PATCH | TRANSDERMAL | Status: DC
  Administered 2021-02-23: 1.5 mg via TRANSDERMAL

## 2021-02-23 MED ORDER — ROCURONIUM BROMIDE 100 MG/10ML IV SOLN
INTRAVENOUS | Status: DC | PRN
  Administered 2021-02-23: 70 mg via INTRAVENOUS

## 2021-02-23 MED ORDER — ACETAMINOPHEN 500 MG PO TABS
ORAL_TABLET | ORAL | Status: AC
  Filled 2021-02-23: qty 2

## 2021-02-23 SURGICAL SUPPLY — 72 items
ADH SKN CLS APL DERMABOND .7 (GAUZE/BANDAGES/DRESSINGS) ×2
APL PRP STRL LF DISP 70% ISPRP (MISCELLANEOUS) ×2
BAG DECANTER FOR FLEXI CONT (MISCELLANEOUS) ×2 IMPLANT
BINDER BREAST LRG (GAUZE/BANDAGES/DRESSINGS) IMPLANT
BINDER BREAST MEDIUM (GAUZE/BANDAGES/DRESSINGS) ×2 IMPLANT
BINDER BREAST XLRG (GAUZE/BANDAGES/DRESSINGS) IMPLANT
BINDER BREAST XXLRG (GAUZE/BANDAGES/DRESSINGS) IMPLANT
BLADE SURG 10 STRL SS (BLADE) ×2 IMPLANT
BLADE SURG 15 STRL LF DISP TIS (BLADE) IMPLANT
BLADE SURG 15 STRL SS (BLADE)
BNDG GAUZE ELAST 4 BULKY (GAUZE/BANDAGES/DRESSINGS) ×4 IMPLANT
CANISTER SUCT 1200ML W/VALVE (MISCELLANEOUS) ×2 IMPLANT
CHLORAPREP W/TINT 26 (MISCELLANEOUS) ×4 IMPLANT
COVER BACK TABLE 60X90IN (DRAPES) ×2 IMPLANT
COVER MAYO STAND STRL (DRAPES) ×2 IMPLANT
COVER WAND RF STERILE (DRAPES) IMPLANT
DECANTER SPIKE VIAL GLASS SM (MISCELLANEOUS) ×2 IMPLANT
DERMABOND ADVANCED (GAUZE/BANDAGES/DRESSINGS) ×2
DERMABOND ADVANCED .7 DNX12 (GAUZE/BANDAGES/DRESSINGS) ×2 IMPLANT
DRAIN CHANNEL 15F RND FF W/TCR (WOUND CARE) IMPLANT
DRAPE TOP ARMCOVERS (MISCELLANEOUS) ×2 IMPLANT
DRAPE U-SHAPE 76X120 STRL (DRAPES) ×2 IMPLANT
DRAPE UTILITY XL STRL (DRAPES) ×2 IMPLANT
DRSG PAD ABDOMINAL 8X10 ST (GAUZE/BANDAGES/DRESSINGS) ×4 IMPLANT
ELECT BLADE 4.0 EZ CLEAN MEGAD (MISCELLANEOUS) ×2
ELECT COATED BLADE 2.86 ST (ELECTRODE) IMPLANT
ELECT REM PT RETURN 9FT ADLT (ELECTROSURGICAL) ×2
ELECTRODE BLDE 4.0 EZ CLN MEGD (MISCELLANEOUS) ×1 IMPLANT
ELECTRODE REM PT RTRN 9FT ADLT (ELECTROSURGICAL) ×1 IMPLANT
EVACUATOR SILICONE 100CC (DRAIN) IMPLANT
GLOVE SURG HYDRASOFT LTX SZ5.5 (GLOVE) ×4 IMPLANT
GOWN STRL REUS W/ TWL LRG LVL3 (GOWN DISPOSABLE) ×2 IMPLANT
GOWN STRL REUS W/TWL LRG LVL3 (GOWN DISPOSABLE) ×4
IMPL BREAST P3.2XRND LO + 265 (Breast) ×1 IMPLANT
IMPL BRST P3.2XRND LO + 265CC (Breast) ×1 IMPLANT
IMPLANT BREAST GEL 265CC (Breast) ×2 IMPLANT
IV NS 1000ML (IV SOLUTION)
IV NS 1000ML BAXH (IV SOLUTION) IMPLANT
IV NS 500ML (IV SOLUTION)
IV NS 500ML BAXH (IV SOLUTION) IMPLANT
KIT FILL SYSTEM UNIVERSAL (SET/KITS/TRAYS/PACK) IMPLANT
MARKER SKIN DUAL TIP RULER LAB (MISCELLANEOUS) IMPLANT
NEEDLE FILTER BLUNT 18X 1/2SAF (NEEDLE)
NEEDLE FILTER BLUNT 18X1 1/2 (NEEDLE) IMPLANT
NEEDLE HYPO 25X1 1.5 SAFETY (NEEDLE) ×2 IMPLANT
PACK BASIN DAY SURGERY FS (CUSTOM PROCEDURE TRAY) ×2 IMPLANT
PENCIL SMOKE EVACUATOR (MISCELLANEOUS) ×2 IMPLANT
PIN SAFETY STERILE (MISCELLANEOUS) IMPLANT
SHEET MEDIUM DRAPE 40X70 STRL (DRAPES) ×4 IMPLANT
SIZER BREAST REUSE 250CC (SIZER) ×2
SIZER BREAST REUSE 265CC (SIZER) ×2
SIZER BRST REUSE 265CC (SIZER) ×1 IMPLANT
SIZER BRST REUSE P3.1XLO 250CC (SIZER) ×1 IMPLANT
SLEEVE SCD COMPRESS KNEE MED (STOCKING) ×2 IMPLANT
SPONGE LAP 18X18 RF (DISPOSABLE) ×4 IMPLANT
STAPLER VISISTAT 35W (STAPLE) ×2 IMPLANT
SUT ETHILON 2 0 FS 18 (SUTURE) IMPLANT
SUT MNCRL AB 4-0 PS2 18 (SUTURE) ×2 IMPLANT
SUT PDS AB 2-0 CT2 27 (SUTURE) ×2 IMPLANT
SUT SILK 2 0 SH (SUTURE) IMPLANT
SUT VIC AB 3-0 PS1 18 (SUTURE)
SUT VIC AB 3-0 PS1 18XBRD (SUTURE) IMPLANT
SUT VIC AB 3-0 SH 27 (SUTURE) ×2
SUT VIC AB 3-0 SH 27X BRD (SUTURE) ×1 IMPLANT
SUT VICRYL 4-0 PS2 18IN ABS (SUTURE) ×2 IMPLANT
SYR 20ML LL LF (SYRINGE) IMPLANT
SYR BULB IRRIG 60ML STRL (SYRINGE) IMPLANT
SYR CONTROL 10ML LL (SYRINGE) ×2 IMPLANT
TOWEL GREEN STERILE FF (TOWEL DISPOSABLE) ×4 IMPLANT
TUBE CONNECTING 20X1/4 (TUBING) ×4 IMPLANT
UNDERPAD 30X36 HEAVY ABSORB (UNDERPADS AND DIAPERS) ×2 IMPLANT
YANKAUER SUCT BULB TIP NO VENT (SUCTIONS) ×2 IMPLANT

## 2021-02-23 NOTE — Anesthesia Postprocedure Evaluation (Signed)
Anesthesia Post Note  Patient: Heather [name]  Procedure(s) Performed: REMOVAL OF TISSUE EXPANDER AND PLACEMENT OF SILICONE IMPLANT (Left Chest)     Patient location during evaluation: PACU Anesthesia Type: General Level of consciousness: sedated Pain management: pain level controlled Vital Signs Assessment: post-procedure vital signs reviewed and stable Respiratory status: spontaneous breathing and respiratory function stable Cardiovascular status: stable Postop Assessment: no apparent nausea or vomiting Anesthetic complications: no   No complications documented.  Last Vitals:  Vitals:   02/23/21 1430 02/23/21 1500  BP: 105/61 116/74  Pulse: 72 79  Resp: 16 16  Temp:  36.4 C  SpO2: 99% 99%    Last Pain:  Vitals:   02/23/21 1500  TempSrc:   PainSc: 0-No pain                 Joziah Dollins DANIEL

## 2021-02-23 NOTE — Op Note (Signed)
Operative Note   DATE OF OPERATION: 5.24.22  LOCATION: [facility]-outpatient  SURGICAL DIVISION: Plastic Surgery  PREOPERATIVE DIAGNOSES:  1. History breast cancer 2. Acquired absence breast 3. History therapeutic radiation  POSTOPERATIVE DIAGNOSES:  same  PROCEDURE:  Removal left chest tissue expander and placement silicone implant  SURGEON: Irene Limbo MD MBA  ASSISTANT: none  ANESTHESIA:  General.   EBL: 50 ml  COMPLICATIONS: None immediate.   INDICATIONS FOR PROCEDURE:  The patient, Heather [name], is a 60 y.o. female born on Dec 28, 1960, is here for staged breast reconstruction following left nipple sparing mastectomy with prepectoral tissue expander acellular dermis reconstruction.   FINDINGS: Complete incorporation ADM noted. Natrelle Soft Touch Low Profile 265 ml implant placed, REF [id number]  DESCRIPTION OF PROCEDURE:  The patient's operative site was marked with the patient in the preoperative area. The patient was taken to the operating room. SCDs were placed and IV antibiotics were given. The patient's operative site was prepped and draped in a sterile fashion. A time out was performed and all information was confirmed to be correct.  Incision made in left inframammary fold scar. Incision carried through superficial fascia and acellular dermis. Expander removed. Capsulotomies performed medially and at inframammary fold. There was asymmetry inframammary fold with left higher. Sub Scarpa dissection completed caudal to incision to desired inframammary fold. Fold stabilized with interrupted 2-0 PDS suture from superficial fascia to chest wall. Sizer placed and patient brought to upright sitting position. A low profile 265 ml implant selected. Patient returned to supine position. Cavity irrigated with saline solution containing Ancef, gentamicin and Betadine. Implant placed within cavity, and implant orientation ensured. Closure completed with 3-0 vicryl in  capsule and superficial fascia, 4-0 vicryl in dermis, and 4-0 monocryl skin closure. Dermabond applied. Dry dressing and breast binder applied.   The patient was allowed to wake from anesthesia, extubated and taken to the recovery room in satisfactory condition.   SPECIMENS: none  DRAINS: none

## 2021-02-23 NOTE — Anesthesia Preprocedure Evaluation (Addendum)
Anesthesia Evaluation  Patient identified by MRN, date of birth, ID band Patient awake    Reviewed: Allergy & Precautions, NPO status , Patient's Chart, lab work & pertinent test results  Airway Mallampati: II  TM Distance: >3 FB Neck ROM: Full    Dental no notable dental hx. (+) Dental Advisory Given   Pulmonary neg pulmonary ROS, former smoker,    Pulmonary exam normal        Cardiovascular hypertension, Pt. on medications Normal cardiovascular exam  IMPRESSIONS    1. The left ventricle has hyperdynamic systolic function, with an  ejection fraction of >65%. The cavity size was normal. Left ventricular  diastolic parameters were normal. No evidence of left ventricular regional  wall motion abnormalities.  2. The right ventricle has normal systolic function. The cavity was  normal. There is no increase in right ventricular wall thickness.  3. The aortic root and ascending aorta are normal in size and structure.  4. The interatrial septum was not well visualized.    Neuro/Psych negative neurological ROS  negative psych ROS   GI/Hepatic negative GI ROS, Neg liver ROS,   Endo/Other  negative endocrine ROS  Renal/GU negative Renal ROS  negative genitourinary   Musculoskeletal negative musculoskeletal ROS (+)   Abdominal   Peds negative pediatric ROS (+)  Hematology negative hematology ROS (+)   Anesthesia Other Findings   Reproductive/Obstetrics negative OB ROS                            Anesthesia Physical  Anesthesia Plan  ASA: II  Anesthesia Plan: General   Post-op Pain Management:    Induction: Intravenous  PONV Risk Score and Plan: 4 or greater and Ondansetron, Dexamethasone, Treatment may vary due to age or medical condition, Midazolam and Scopolamine patch - Pre-op  Airway Management Planned: LMA  Additional Equipment:   Intra-op Plan:   Post-operative Plan:  Extubation in OR  Informed Consent: I have reviewed the patients History and Physical, chart, labs and discussed the procedure including the risks, benefits and alternatives for the proposed anesthesia with the patient or authorized representative who has indicated his/her understanding and acceptance.     Dental advisory given  Plan Discussed with: Anesthesiologist and CRNA  Anesthesia Plan Comments:        Anesthesia Quick Evaluation

## 2021-02-23 NOTE — Transfer of Care (Signed)
Immediate Anesthesia Transfer of Care Note  Patient: Heather [name]  Procedure(s) Performed: REMOVAL OF TISSUE EXPANDER AND PLACEMENT OF SILICONE IMPLANT (Left Chest)  Patient Location: PACU  Anesthesia Type:General  Level of Consciousness: awake, alert  and oriented  Airway & Oxygen Therapy: Patient Spontanous Breathing and Patient connected to face mask oxygen  Post-op Assessment: Report given to RN and Post -op Vital signs reviewed and stable  Post vital signs: Reviewed and stable  Last Vitals:  Vitals Value Taken Time  BP    Temp    Pulse 74 02/23/21 1414  Resp    SpO2 100 % 02/23/21 1414  Vitals shown include unvalidated device data.  Last Pain:  Vitals:   02/23/21 1152  TempSrc: Oral  PainSc: 0-No pain      Patients Stated Pain Goal: 4 (09/81/19 1478)  Complications: No complications documented.

## 2021-02-23 NOTE — Anesthesia Procedure Notes (Signed)
Procedure Name: Intubation Performed by: Verita Lamb, CRNA Pre-anesthesia Checklist: Patient identified, Emergency Drugs available, Suction available and Patient being monitored Patient Re-evaluated:Patient Re-evaluated prior to induction Oxygen Delivery Method: Circle system utilized Preoxygenation: Pre-oxygenation with 100% oxygen Induction Type: IV induction Ventilation: Mask ventilation without difficulty Laryngoscope Size: Miller and 2 Grade View: Grade III Tube type: Oral Tube size: 7.0 mm Number of attempts: 2 Airway Equipment and Method: Stylet and Oral airway Placement Confirmation: ETT inserted through vocal cords under direct vision,  positive ETCO2,  breath sounds checked- equal and bilateral and CO2 detector Tube secured with: Tape Dental Injury: Teeth and Oropharynx as per pre-operative assessment  Difficulty Due To: Difficulty was unanticipated Comments: Pt was an easy mask ventilation but her glottic opening was surprisingly deep.  I used a mac 3 blade and had a Grade III view; with cricoid pressure and head lift I could visualize the base of the glottic opening but I could not direct the tube into the trachea due to an anterior larynx.  Dr. Tobias Alexander intubated with a miller 2 blade.  BBS, +ETCO2

## 2021-02-23 NOTE — Discharge Instructions (Signed)
No tylenol until after 6:15pm today. No ibuprofen/motrin until after 8:15pm today.  Post Anesthesia Home Care Instructions  Activity: Get plenty of rest for the remainder of the day. A responsible individual must stay with you for 24 hours following the procedure.  For the next 24 hours, DO NOT: -Drive a car -Paediatric nurse -Drink alcoholic beverages -Take any medication unless instructed by your physician -Make any legal decisions or sign important papers.  Meals: Start with liquid foods such as gelatin or soup. Progress to regular foods as tolerated. Avoid greasy, spicy, heavy foods. If nausea and/or vomiting occur, drink only clear liquids until the nausea and/or vomiting subsides. Call your physician if vomiting continues.  Special Instructions/Symptoms: Your throat may feel dry or sore from the anesthesia or the breathing tube placed in your throat during surgery. If this causes discomfort, gargle with warm salt water. The discomfort should disappear within 24 hours.  If you had a scopolamine patch placed behind your ear for the management of post- operative nausea and/or vomiting:  1. The medication in the patch is effective for 72 hours, after which it should be removed.  Wrap patch in a tissue and discard in the trash. Wash hands thoroughly with soap and water. 2. You may remove the patch earlier than 72 hours if you experience unpleasant side effects which may include dry mouth, dizziness or visual disturbances. 3. Avoid touching the patch. Wash your hands with soap and water after contact with the patch.

## 2021-02-23 NOTE — Interval H&P Note (Signed)
History and Physical Interval Note:  02/23/2021 11:58 AM  Heather [name]  has presented today for surgery, with the diagnosis of history breast cancer, acquired absence breasts, history therapeutic radiation.  The various methods of treatment have been discussed with the patient and family. After consideration of risks, benefits and other options for treatment, the patient has consented to  Procedure(s): REMOVAL OF TISSUE EXPANDER AND PLACEMENT OF SILICONE IMPLANT (Left) as a surgical intervention.  The patient's history has been reviewed, patient examined, no change in status, stable for surgery.  I have reviewed the patient's chart and labs.  Questions were answered to the patient's satisfaction.     Arnoldo Hooker Fidencia Mccloud

## 2021-02-24 ENCOUNTER — Encounter (HOSPITAL_BASED_OUTPATIENT_CLINIC_OR_DEPARTMENT_OTHER): Payer: Self-pay | Admitting: Plastic Surgery

## 2021-02-24 NOTE — Addendum Note (Signed)
Addendum  created 02/24/21 0936 by Vishruth Seoane, Ernesta Amble, CRNA   Charge Capture section accepted

## 2021-03-02 DIAGNOSIS — Z923 Personal history of irradiation: Secondary | ICD-10-CM

## 2021-03-02 HISTORY — DX: Personal history of irradiation: Z92.3

## 2021-03-09 ENCOUNTER — Other Ambulatory Visit: Payer: Self-pay | Admitting: Osteopathic Medicine

## 2021-03-09 DIAGNOSIS — I10 Essential (primary) hypertension: Secondary | ICD-10-CM

## 2021-04-02 ENCOUNTER — Other Ambulatory Visit: Payer: Self-pay | Admitting: Adult Health

## 2021-04-02 DIAGNOSIS — Z1231 Encounter for screening mammogram for malignant neoplasm of breast: Secondary | ICD-10-CM

## 2021-05-23 ENCOUNTER — Other Ambulatory Visit: Payer: Self-pay | Admitting: Osteopathic Medicine

## 2021-05-23 DIAGNOSIS — I10 Essential (primary) hypertension: Secondary | ICD-10-CM

## 2021-05-27 ENCOUNTER — Ambulatory Visit
Admission: RE | Admit: 2021-05-27 | Discharge: 2021-05-27 | Disposition: A | Discharge status: Home / Self Care | Payer: BC Managed Care – PPO | Source: Ambulatory Visit | Attending: Adult Health | Admitting: Adult Health

## 2021-05-27 ENCOUNTER — Other Ambulatory Visit: Payer: Self-pay

## 2021-05-27 DIAGNOSIS — Z1231 Encounter for screening mammogram for malignant neoplasm of breast: Secondary | ICD-10-CM

## 2021-06-09 ENCOUNTER — Other Ambulatory Visit: Payer: Self-pay | Admitting: Osteopathic Medicine

## 2021-06-09 DIAGNOSIS — I10 Essential (primary) hypertension: Secondary | ICD-10-CM

## 2021-06-25 ENCOUNTER — Other Ambulatory Visit: Payer: Self-pay | Admitting: Medical-Surgical

## 2021-06-25 DIAGNOSIS — I10 Essential (primary) hypertension: Secondary | ICD-10-CM

## 2021-07-04 ENCOUNTER — Other Ambulatory Visit: Payer: Self-pay | Admitting: Medical-Surgical

## 2021-07-04 DIAGNOSIS — I10 Essential (primary) hypertension: Secondary | ICD-10-CM

## 2021-07-09 ENCOUNTER — Ambulatory Visit: Payer: BC Managed Care – PPO | Admitting: Medical-Surgical

## 2021-07-19 ENCOUNTER — Other Ambulatory Visit: Payer: Self-pay | Admitting: Medical-Surgical

## 2021-07-19 ENCOUNTER — Other Ambulatory Visit: Payer: Self-pay

## 2021-07-19 ENCOUNTER — Encounter: Payer: Self-pay | Admitting: Medical-Surgical

## 2021-07-19 ENCOUNTER — Ambulatory Visit: Payer: BC Managed Care – PPO | Admitting: Medical-Surgical

## 2021-07-19 VITALS — BP 126/77 | HR 99 | Resp 20 | Ht 66.0 in | Wt 117.0 lb

## 2021-07-19 DIAGNOSIS — Z1211 Encounter for screening for malignant neoplasm of colon: Secondary | ICD-10-CM

## 2021-07-19 DIAGNOSIS — I10 Essential (primary) hypertension: Secondary | ICD-10-CM

## 2021-07-19 DIAGNOSIS — R7309 Other abnormal glucose: Secondary | ICD-10-CM | POA: Diagnosis not present

## 2021-07-19 DIAGNOSIS — E559 Vitamin D deficiency, unspecified: Secondary | ICD-10-CM | POA: Diagnosis not present

## 2021-07-19 DIAGNOSIS — Z23 Encounter for immunization: Secondary | ICD-10-CM

## 2021-07-19 MED ORDER — LISINOPRIL-HYDROCHLOROTHIAZIDE 20-12.5 MG PO TABS: 1.0000 | ORAL_TABLET | Freq: Every day | ORAL | 3 refills | Status: DC

## 2021-07-19 MED ORDER — LISINOPRIL-HYDROCHLOROTHIAZIDE 10-12.5 MG PO TABS: ORAL_TABLET | ORAL | 1 refills | Status: DC

## 2021-07-19 NOTE — Progress Notes (Signed)
  HPI with pertinent ROS:   CC: Transfer of care, chronic disease follow-up  HPI: Pleasant 60 year old female presenting for transfer of care to a new PCP and for HTN follow up. She checks her BP at home with readings in the 381O-175Z systolic most of the time. Adds salt to her food. Occasional activity- walking. Denies CP, SOB, palpitations, lower extremity edema, dizziness, headaches, or vision changes.   I reviewed the past medical history, family history, social history, surgical history, and allergies today and no changes were needed.  Please see the problem list section below in epic for further details.   Physical exam:   General: Well Developed, well nourished, and in no acute distress.  Neuro: Alert and oriented x3.  HEENT: Normocephalic, atraumatic.  Skin: Warm and dry. Cardiac: Regular rate and rhythm, no murmurs rubs or gallops, no lower extremity edema.  Respiratory: Clear to auscultation bilaterally. Not using accessory muscles, speaking in full sentences.  Impression and Recommendations:    1. Essential hypertension Checking labs. Home BP and initial BP here above goal. Changing to Lisinopril-HCTZ 20-12.5mg  daily. Monitor BP at home with goal of 130/80 or less. Recommend low sodium diet.  - CBC with Differential/Platelet - COMPLETE METABOLIC PANEL WITH GFR - Lipid panel  2. Elevated hemoglobin A1c Checking A1c.  - Hemoglobin A1c  3. Vitamin D deficiency Checking Vitamin D.  - VITAMIN D 25 Hydroxy (Vit-D Deficiency, Fractures)  4. Flu vaccine need Flu vaccine given today.  - Flu Vaccine QUAD 58mo+IM (Fluarix, Fluzone & Alfiuria Quad PF)  5. Colon cancer screening Cologuard ordered.  - Cologuard  Return in about 2 weeks (around 08/02/2021) for nurse visit for BP check. ___________________________________________ Clearnce Sorrel, DNP, APRN, FNP-BC Primary Care and [place]

## 2021-07-20 ENCOUNTER — Encounter: Payer: Self-pay | Admitting: Hematology and Oncology

## 2021-07-20 LAB — COMPLETE METABOLIC PANEL WITH GFR
AG Ratio: 2.2 (calc) (ref 1.0–2.5)
ALT: 10 U/L (ref 6–29)
AST: 29 U/L (ref 10–35)
Albumin: 4.8 g/dL (ref 3.6–5.1)
Alkaline phosphatase (APISO): 78 U/L (ref 37–153)
BUN: 13 mg/dL (ref 7–25)
CO2: 31 mmol/L (ref 20–32)
Calcium: 10.4 mg/dL (ref 8.6–10.4)
Chloride: 101 mmol/L (ref 98–110)
Creat: 0.85 mg/dL (ref 0.50–1.05)
Globulin: 2.2 g/dL (calc) (ref 1.9–3.7)
Glucose, Bld: 95 mg/dL (ref 65–99)
Potassium: 4.5 mmol/L (ref 3.5–5.3)
Sodium: 140 mmol/L (ref 135–146)
Total Bilirubin: 0.6 mg/dL (ref 0.2–1.2)
Total Protein: 7 g/dL (ref 6.1–8.1)
eGFR: 78 mL/min/{1.73_m2} (ref >=60)

## 2021-07-20 LAB — CBC WITH DIFFERENTIAL/PLATELET
Absolute Monocytes: 451 cells/uL (ref 200–950)
Basophils Absolute: 43 cells/uL (ref 0–200)
Basophils Relative: 0.5 %
Eosinophils Absolute: 17 cells/uL (ref 15–500)
Eosinophils Relative: 0.2 %
HCT: 42.5 % (ref 35.0–45.0)
Hemoglobin: 13.7 g/dL (ref 11.7–15.5)
Lymphs Abs: 1581 cells/uL (ref 850–3900)
MCH: 27.6 pg (ref 27.0–33.0)
MCHC: 32.2 g/dL (ref 32.0–36.0)
MCV: 85.5 fL (ref 80.0–100.0)
MPV: 9.8 fL (ref 7.5–12.5)
Monocytes Relative: 5.3 %
Neutro Abs: 6409 cells/uL (ref 1500–7800)
Neutrophils Relative %: 75.4 %
Platelets: 304 10*3/uL (ref 140–400)
RBC: 4.97 10*6/uL (ref 3.80–5.10)
RDW: 12.6 % (ref 11.0–15.0)
Total Lymphocyte: 18.6 %
WBC: 8.5 10*3/uL (ref 3.8–10.8)

## 2021-07-20 LAB — LIPID PANEL
Cholesterol: 224 mg/dL — ABNORMAL HIGH (ref <200)
HDL: 54 mg/dL (ref >=50)
LDL Cholesterol (Calc): 151 mg/dL (calc) — ABNORMAL HIGH
Non-HDL Cholesterol (Calc): 170 mg/dL (calc) — ABNORMAL HIGH (ref <130)
Total CHOL/HDL Ratio: 4.1 (calc) (ref <5.0)
Triglycerides: 85 mg/dL (ref <150)

## 2021-07-20 LAB — HEMOGLOBIN A1C
Hgb A1c MFr Bld: 5.5 % of total Hgb (ref <5.7)
Mean Plasma Glucose: 111 mg/dL
eAG (mmol/L): 6.2 mmol/L

## 2021-07-20 LAB — VITAMIN D 25 HYDROXY (VIT D DEFICIENCY, FRACTURES): Vit D, 25-Hydroxy: 26 ng/mL — ABNORMAL LOW (ref 30–100)

## 2021-07-20 MED ORDER — ATORVASTATIN CALCIUM 20 MG PO TABS: 20.0000 mg | ORAL_TABLET | Freq: Every day | ORAL | 3 refills | Status: DC

## 2021-07-20 MED ORDER — VITAMIN D3 25 MCG (1000 UT) PO CAPS: 1000.0000 [IU] | ORAL_CAPSULE | Freq: Every day | ORAL | 1 refills | Status: DC

## 2021-07-20 NOTE — Addendum Note (Signed)
Addended bySamuel Bouche on: 07/20/2021 02:47 PM   Modules accepted: Orders

## 2021-07-21 ENCOUNTER — Other Ambulatory Visit: Payer: Self-pay

## 2021-07-21 DIAGNOSIS — E785 Hyperlipidemia, unspecified: Secondary | ICD-10-CM

## 2021-07-21 DIAGNOSIS — Z79899 Other long term (current) drug therapy: Secondary | ICD-10-CM

## 2021-08-04 ENCOUNTER — Ambulatory Visit: Payer: BC Managed Care – PPO

## 2021-12-23 NOTE — Assessment & Plan Note (Addendum)
11/13/2018:?Left mastectomy: Grade 2 IDC, 1.6 cm, with intermediate grade DCIS, margins negative, lymphovascular invasion present, 1/7 lymph nodes positive, MammaPrint high risk, ER 100%, PR 30%, HER-2 negative, Ki-67 30% T1CN1A stage Ia ?? ?Treatment plan: ?1.??Adjuvant chemotherapy with dose since he remains on Cytoxan x4 followed by Taxol weekly x10?completed 04/22/2019  ?2.??Followed by adjuvant radiation therapy?06/06/2019-07/19/2019 ?3.??Followed by adjuvant antiestrogen therapy ?------------------------------------------------------------------------------------------------------------------ ?Treatment plan: Adjuvant antiestrogen therapy with anastrozole 1 mg p.o. daily started November 2020 ?? ?Anastrozole Toxicities: Denies any major hot flashes or myalgias. ?? ?Breast Cancer Surveillance: ?1. Mammograms 05/28/2021: Benign, breast density category D ?2/ Breast Exam: 12/24/21: Benign ?3. 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 ?? ?Return to clinic in 1 year for follow-up ?

## 2021-12-23 NOTE — Progress Notes (Signed)
? ?Patient Care Team: ?Samuel Bouche, NP as PCP - General (Nurse Practitioner) ?Rolm Bookbinder, MD as Consulting Physician (General Surgery) ?Nicholas Lose, MD as Consulting Physician (Hematology and Oncology) ?Eppie Gibson, MD as Attending Physician (Radiation Oncology) ? ?DIAGNOSIS:  ?Encounter Diagnosis  ?Name Primary?  ? Malignant neoplasm of upper-outer quadrant of left breast in female, estrogen receptor positive ([place])   ? ? ?SUMMARY OF ONCOLOGIC HISTORY: ?Oncology History  ?Malignant neoplasm of upper-outer quadrant of left breast in female, estrogen receptor positive ([place])  ?09/12/2018 Initial Diagnosis  ? Palpable left breast mass at 2:30 position by ultrasound measured 1.6 cm, calcifications spanning 5.9 cm, 3 abnormal lymph nodes, multiple masses and calcifications biopsy of the mass revealed grade 2 IDC plus DCIS with lymphovascular invasion, ER 100%, PR 60%, Ki-67 30%, HER-2 negative IHC 1+, pathology of calcifications is pending, biopsy of lymph nodes is discordant benign, T1cN0 stage Ia ?  ?09/19/2018 Cancer Staging  ? Staging form: Breast, AJCC 8th Edition ?- Clinical stage from 09/19/2018: Stage IA (cT1c(m), cN0, cM0, G2, ER+, PR+, HER2-) - Signed by Nicholas Lose, MD on 09/19/2018 ? ?  ?09/2018 - 09/2025 Anti-estrogen oral therapy  ? Letrozole 2.5 mg and discontinued in 12/2018. Anastrozole 1 mg started 07/2019.  ?  ?11/13/2018 Surgery  ? Left mastectomy Donne Hazel) [phone]): Grade 2 IDC, 1.6 cm, with intermediate grade DCIS, margins negative, lymphovascular invasion present, 1/7 lymph nodes positive, MammaPrint high risk, ER 100%, PR 30%, HER-2 negative, Ki-67 30% T1CN1A stage Ia ?  ?11/13/2018 Cancer Staging  ? Staging form: Breast, AJCC 8th Edition ?- Pathologic stage from 11/13/2018: Stage IA (pT1c, pN1a, cM0, G2, ER+, PR+, HER2-) - Signed by Gardenia Phlegm, NP on 11/28/2018 ? ?  ?11/13/2018 Oncotype testing  ? MammaPrint results reveal a high risk luminal type-B with a 94.6%  predicted benefit of treatment at 5 years with both chemotherapy and hormonal therapy.  ? ?  ?12/17/2018 - 04/22/2019 Chemotherapy  ? Adjuvant chemo with AC Taxol ?  ?06/06/2019 - 07/23/2019 Radiation Therapy  ? The patient initially received a dose of 50.4 Gy in 28 fractions to the breast and 50.4 Gy in 28 fractions to the left supraclavicular region using whole-breast tangent fields. This was delivered using a 3-D conformal technique. The pt received a boost delivering an additional 10 Gy in 5 fractions using a electron boost with 96mV electrons. The total dose was 110.8 Gy. ? ?  ?08/2019 -  Anti-estrogen oral therapy  ? Anastrozole ?  ? ? ?CHIEF COMPLIANT: Follow-up of left breast cancer on anastrozole  ?  ? ?INTERVAL HISTORY: Heather VKellye Mizneris a 61y.o. with above-mentioned history of left breast cancer treated with mastectomy, adjuvant chemotherapy, radiation, and who is currently on antiestrogen therapy with anastrozole. Mammogram on 01/17/20 showed no evidence of malignancy bilaterally. She presents to the clinic today for follow-up. She is tolerating the anastrozole. Denies pain in breast.    ? ? ?ALLERGIES:  has No Known Allergies. ? ?MEDICATIONS:  ?Current Outpatient Medications  ?Medication Sig Dispense Refill  ? anastrozole (ARIMIDEX) 1 MG tablet Take 1 tablet (1 mg total) by mouth daily. 90 tablet 3  ? atorvastatin (LIPITOR) 20 MG tablet Take 1 tablet (20 mg total) by mouth daily. 90 tablet 3  ? Cholecalciferol (VITAMIN D3) 25 MCG (1000 UT) CAPS Take 1 capsule (1,000 Units total) by mouth daily. 90 capsule 1  ? lisinopril-hydrochlorothiazide (ZESTORETIC) 10-12.5 MG tablet TAKE 1 TABLET BY MOUTH DAILY 90 tablet 1  ? ?No  current facility-administered medications for this visit.  ? ?Facility-Administered Medications Ordered in Other Visits  ?Medication Dose Route Frequency Provider Last Rate Last Admin  ? sodium chloride flush (NS) 0.9 % injection 10 mL  10 mL Intravenous PRN Nicholas Lose, MD   10 mL at  12/17/18 0907  ? ? ?PHYSICAL EXAMINATION: ?ECOG PERFORMANCE STATUS: 0 - Asymptomatic ? ?Vitals:  ? 12/24/21 0821  ?BP: 130/74  ?Pulse: 94  ?Resp: 17  ?Temp: 97.8 ?F (36.6 ?C)  ?SpO2: 100%  ? ?Filed Weights  ? 12/24/21 0821  ?Weight: 119 lb 1.6 oz (54 kg)  ? ? ?BREAST: No palpable lumps or masses in the right breast or axilla.  Left breast has been reconstructed and no palpable lumps or nodules (exam performed in the presence of a chaperone) ? ?LABORATORY DATA:  ?I have reviewed the data as listed ? ?  Latest Ref Rng & Units 07/19/2021  ? 12:00 AM 02/19/2021  ?  9:00 AM 08/22/2019  ? 12:44 PM  ?CMP  ?Glucose 65 - 99 mg/dL 95   93   107    ?BUN 7 - 25 mg/dL _0 ?Creatinine 0.50 - 1.05 mg/dL 0.85   0.89   0.91    ?Sodium 135 - 146 mmol/L 140   140   140    ?Potassium 3.5 - 5.3 mmol/L 4.5   4.3   4.0    ?Chloride 98 - 110 mmol/L 101   106   104    ?CO2 20 - 32 mmol/L _1 ?Calcium 8.6 - 10.4 mg/dL 10.4   9.6   9.5    ?Total Protein 6.1 - 8.1 g/dL 7.0      ?Total Bilirubin 0.2 - 1.2 mg/dL 0.6      ?AST 10 - 35 U/L 29      ?ALT 6 - 29 U/L 10      ? ? ?Lab Results  ?Component Value Date  ? WBC 8.5 07/19/2021  ? HGB 13.7 07/19/2021  ? HCT 42.5 07/19/2021  ? MCV 85.5 07/19/2021  ? PLT 304 07/19/2021  ? NEUTROABS 6,409 07/19/2021  ? ? ?ASSESSMENT & PLAN:  ?Malignant neoplasm of upper-outer quadrant of left breast in female, estrogen receptor positive ([place]) ?11/13/2018: Left mastectomy: Grade 2 IDC, 1.6 cm, with intermediate grade DCIS, margins negative, lymphovascular invasion present, 1/7 lymph nodes positive, MammaPrint high risk, ER 100%, PR 30%, HER-2 negative, Ki-67 30% T1CN1A stage Ia ?  ?Treatment plan: ?1.  Adjuvant chemotherapy with dose since he remains on Cytoxan x4 followed by Taxol weekly x10 completed 04/22/2019  ?2.  Followed by adjuvant radiation therapy 06/06/2019-07/19/2019 ?3.  Followed by adjuvant antiestrogen  therapy ?------------------------------------------------------------------------------------------------------------------ ?Treatment plan: Adjuvant antiestrogen therapy with anastrozole 1 mg p.o. daily started November 2020 ?  ?Anastrozole Toxicities: Denies any major hot flashes or myalgias. ?  ?Breast Cancer Surveillance: ?1. Mammograms 05/28/2021: Benign, breast density category D ?2/ Breast Exam: 12/24/21: Benign ?3. 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 ?  ?Return to clinic in 1 year for follow-up ? ? ? ?No orders of the defined types were placed in this encounter. ? ?The patient has a good understanding of the overall plan. she agrees with it. she will call with any problems that may develop before the next visit  here. ?Total time spent: 30 mins including face to face time and time spent for planning, charting and co-ordination of care ? ? Harriette Ohara, MD ?12/24/21 ? ? ? I Gardiner Coins am scribing for Dr. Lindi Adie ? ?I have reviewed the above documentation for accuracy and completeness, and I agree with the above. ?  ?

## 2021-12-24 ENCOUNTER — Inpatient Hospital Stay: Payer: BC Managed Care – PPO | Attending: Hematology and Oncology | Admitting: Hematology and Oncology

## 2021-12-24 ENCOUNTER — Other Ambulatory Visit: Payer: Self-pay

## 2021-12-24 DIAGNOSIS — C50412 Malignant neoplasm of upper-outer quadrant of left female breast: Secondary | ICD-10-CM | POA: Diagnosis present

## 2021-12-24 DIAGNOSIS — Z170 Estrogen receptor positive status [ER+]: Secondary | ICD-10-CM | POA: Insufficient documentation

## 2021-12-24 DIAGNOSIS — Z79811 Long term (current) use of aromatase inhibitors: Secondary | ICD-10-CM | POA: Diagnosis not present

## 2021-12-24 DIAGNOSIS — Z79899 Other long term (current) drug therapy: Secondary | ICD-10-CM | POA: Diagnosis not present

## 2021-12-24 MED ORDER — ANASTROZOLE 1 MG PO TABS: 1.0000 mg | ORAL_TABLET | Freq: Every day | ORAL | 3 refills | Status: DC

## 2022-01-24 ENCOUNTER — Other Ambulatory Visit: Payer: Self-pay | Admitting: Medical-Surgical

## 2022-01-24 DIAGNOSIS — I10 Essential (primary) hypertension: Secondary | ICD-10-CM

## 2022-04-14 ENCOUNTER — Other Ambulatory Visit: Payer: Self-pay | Admitting: Medical-Surgical

## 2022-04-14 DIAGNOSIS — Z1231 Encounter for screening mammogram for malignant neoplasm of breast: Secondary | ICD-10-CM

## 2022-04-18 ENCOUNTER — Other Ambulatory Visit: Payer: Self-pay | Admitting: Medical-Surgical

## 2022-04-18 DIAGNOSIS — I10 Essential (primary) hypertension: Secondary | ICD-10-CM

## 2022-04-25 ENCOUNTER — Other Ambulatory Visit: Payer: Self-pay

## 2022-05-24 ENCOUNTER — Other Ambulatory Visit: Payer: Self-pay | Admitting: Medical-Surgical

## 2022-05-24 DIAGNOSIS — I10 Essential (primary) hypertension: Secondary | ICD-10-CM

## 2022-05-26 NOTE — Telephone Encounter (Signed)
Last office visit 07/19/2021  Last filled 01/24/2022  Upcoming appointment 07/18/2022

## 2022-05-30 ENCOUNTER — Other Ambulatory Visit: Payer: Self-pay | Admitting: Medical-Surgical

## 2022-05-30 ENCOUNTER — Ambulatory Visit
Admission: RE | Admit: 2022-05-30 | Discharge: 2022-05-30 | Disposition: A | Discharge status: Home / Self Care | Payer: BC Managed Care – PPO | Source: Ambulatory Visit | Attending: Medical-Surgical | Admitting: Medical-Surgical

## 2022-05-30 DIAGNOSIS — Z1231 Encounter for screening mammogram for malignant neoplasm of breast: Secondary | ICD-10-CM

## 2022-05-30 HISTORY — DX: Malignant neoplasm of unspecified site of unspecified female breast: C50.919

## 2022-06-18 ENCOUNTER — Other Ambulatory Visit: Payer: Self-pay | Admitting: Medical-Surgical

## 2022-06-18 DIAGNOSIS — I10 Essential (primary) hypertension: Secondary | ICD-10-CM

## 2022-07-17 NOTE — Progress Notes (Unsigned)
   Complete physical exam  Patient: Heather [name]   DOB: November 23, 1960   61 y.o. Female  MRN: [id number]  Subjective:    No chief complaint on file.   Heather [name] is a 61 y.o. female who presents today for a complete physical exam. She reports consuming a {diet types:17450} diet. {types:19826} She generally feels {DESC; WELL/FAIRLY WELL/POORLY:18703}. She reports sleeping {DESC; WELL/FAIRLY WELL/POORLY:18703}. She {does/does not:200015} have additional problems to discuss today.    Most recent fall risk assessment:     No data to display           Most recent depression screenings:    07/19/2021   11:20 AM 12/04/2018    8:28 AM  PHQ 2/9 Scores  PHQ - 2 Score 0 0    {VISON DENTAL STD PSA (Optional):27386}  {History (Optional):23778}  Patient Care Team: Samuel Bouche, NP as PCP - General (Nurse Practitioner) Rolm Bookbinder, MD as Consulting Physician (General Surgery) Nicholas Lose, MD as Consulting Physician (Hematology and Oncology) Eppie Gibson, MD as Attending Physician (Radiation Oncology)   Outpatient Medications Prior to Visit  Medication Sig   Cholecalciferol ([employer] D3) 25 MCG (1000 UT) capsule Take 1 capsule (1,000 Units total) by mouth daily. NEEDS APPOINTMENT FOR FURTHER REFILLS   anastrozole (ARIMIDEX) 1 MG tablet Take 1 tablet (1 mg total) by mouth daily.   atorvastatin (LIPITOR) 20 MG tablet Take 1 tablet (20 mg total) by mouth daily.   lisinopril-hydrochlorothiazide (ZESTORETIC) 10-12.5 MG tablet TAKE 1 TABLET BY MOUTH EVERY DAY.   Facility-Administered Medications Prior to Visit  Medication Dose Route Frequency Provider   sodium chloride flush (NS) 0.9 % injection 10 mL  10 mL Intravenous PRN Nicholas Lose, MD    ROS        Objective:     There were no vitals taken for this visit. {Vitals History (Optional):23777}  Physical Exam   No results found for any visits on 07/18/22. {Show previous labs (optional):23779}     Assessment & Plan:    Routine Health Maintenance and Physical Exam  Immunization History  Administered Date(s) Administered   Influenza,inj,Quad PF,6+ Mos 09/10/2019, 07/19/2021   Influenza-Unspecified 07/28/2017   Moderna SARS-COV2 Booster Vaccination 09/04/2020   Moderna Sars-Covid-2 Vaccination 11/14/2019, 12/17/2019   Tdap 04/13/2016    Health Maintenance  Topic Date Due   Zoster Vaccines- Shingrix (1 of 2) Never done   COLONOSCOPY (Pts 45-62yr Insurance coverage will need to be confirmed)  Never done   COVID-19 Vaccine (3 - Moderna risk series) 10/02/2020   PAP SMEAR-Modifier  04/13/2021   INFLUENZA VACCINE  05/03/2022   MAMMOGRAM  05/30/2024   TETANUS/TDAP  04/13/2026   Hepatitis C Screening  Completed   HIV Screening  Completed   HPV VACCINES  Aged Out    Discussed health benefits of physical activity, and encouraged her to engage in regular exercise appropriate for her age and condition.  Problem List Items Addressed This Visit       Cardiovascular and Mediastinum   Essential hypertension - Primary     Other   Annual physical exam   Elevated hemoglobin A1c   Other Visit Diagnoses     Cervical cancer screening       Colon cancer screening       Need for shingles vaccine       Need for influenza vaccination          No follow-ups on file.     JSamuel Bouche NP

## 2022-07-18 ENCOUNTER — Ambulatory Visit (INDEPENDENT_AMBULATORY_CARE_PROVIDER_SITE_OTHER): Payer: BC Managed Care – PPO | Admitting: Medical-Surgical

## 2022-07-18 ENCOUNTER — Encounter: Payer: Self-pay | Admitting: Medical-Surgical

## 2022-07-18 ENCOUNTER — Other Ambulatory Visit (HOSPITAL_COMMUNITY)
Admission: RE | Admit: 2022-07-18 | Discharge: 2022-07-18 | Disposition: A | Discharge status: Home / Self Care | Payer: BC Managed Care – PPO | Source: Ambulatory Visit | Attending: Medical-Surgical | Admitting: Medical-Surgical

## 2022-07-18 VITALS — BP 114/61 | HR 115 | Resp 20 | Ht 66.0 in | Wt 117.1 lb

## 2022-07-18 DIAGNOSIS — Z1211 Encounter for screening for malignant neoplasm of colon: Secondary | ICD-10-CM

## 2022-07-18 DIAGNOSIS — I10 Essential (primary) hypertension: Secondary | ICD-10-CM

## 2022-07-18 DIAGNOSIS — Z23 Encounter for immunization: Secondary | ICD-10-CM

## 2022-07-18 DIAGNOSIS — Z124 Encounter for screening for malignant neoplasm of cervix: Secondary | ICD-10-CM | POA: Diagnosis present

## 2022-07-18 DIAGNOSIS — G620 Drug-induced polyneuropathy: Secondary | ICD-10-CM | POA: Diagnosis not present

## 2022-07-18 DIAGNOSIS — R000 Tachycardia, unspecified: Secondary | ICD-10-CM | POA: Diagnosis not present

## 2022-07-18 DIAGNOSIS — E559 Vitamin D deficiency, unspecified: Secondary | ICD-10-CM

## 2022-07-18 DIAGNOSIS — R7309 Other abnormal glucose: Secondary | ICD-10-CM

## 2022-07-18 DIAGNOSIS — Z0000 Encounter for general adult medical examination without abnormal findings: Secondary | ICD-10-CM | POA: Diagnosis not present

## 2022-07-18 DIAGNOSIS — T451X5A Adverse effect of antineoplastic and immunosuppressive drugs, initial encounter: Secondary | ICD-10-CM

## 2022-07-19 ENCOUNTER — Other Ambulatory Visit: Payer: Self-pay

## 2022-07-19 ENCOUNTER — Encounter: Payer: Self-pay | Admitting: Hematology and Oncology

## 2022-07-19 ENCOUNTER — Other Ambulatory Visit: Payer: Self-pay | Admitting: Medical-Surgical

## 2022-07-19 DIAGNOSIS — I10 Essential (primary) hypertension: Secondary | ICD-10-CM

## 2022-07-19 LAB — VITAMIN D 25 HYDROXY (VIT D DEFICIENCY, FRACTURES): Vit D, 25-Hydroxy: 31 ng/mL (ref 30–100)

## 2022-07-19 LAB — COMPLETE METABOLIC PANEL WITH GFR
AG Ratio: 2.8 (calc) — ABNORMAL HIGH (ref 1.0–2.5)
ALT: 16 U/L (ref 6–29)
AST: 28 U/L (ref 10–35)
Albumin: 5 g/dL (ref 3.6–5.1)
Alkaline phosphatase (APISO): 95 U/L (ref 37–153)
BUN: 19 mg/dL (ref 7–25)
CO2: 29 mmol/L (ref 20–32)
Calcium: 10.6 mg/dL — ABNORMAL HIGH (ref 8.6–10.4)
Chloride: 101 mmol/L (ref 98–110)
Creat: 0.76 mg/dL (ref 0.50–1.05)
Globulin: 1.8 g/dL (calc) — ABNORMAL LOW (ref 1.9–3.7)
Glucose, Bld: 111 mg/dL — ABNORMAL HIGH (ref 65–99)
Potassium: 4.1 mmol/L (ref 3.5–5.3)
Sodium: 139 mmol/L (ref 135–146)
Total Bilirubin: 0.6 mg/dL (ref 0.2–1.2)
Total Protein: 6.8 g/dL (ref 6.1–8.1)
eGFR: 89 mL/min/{1.73_m2} (ref >=60)

## 2022-07-19 LAB — CBC WITH DIFFERENTIAL/PLATELET
Absolute Monocytes: 454 cells/uL (ref 200–950)
Basophils Absolute: 38 cells/uL (ref 0–200)
Basophils Relative: 0.6 %
Eosinophils Absolute: 32 cells/uL (ref 15–500)
Eosinophils Relative: 0.5 %
HCT: 44.1 % (ref 35.0–45.0)
Hemoglobin: 14 g/dL (ref 11.7–15.5)
Lymphs Abs: 1619 cells/uL (ref 850–3900)
MCH: 26.7 pg — ABNORMAL LOW (ref 27.0–33.0)
MCHC: 31.7 g/dL — ABNORMAL LOW (ref 32.0–36.0)
MCV: 84.2 fL (ref 80.0–100.0)
MPV: 9.6 fL (ref 7.5–12.5)
Monocytes Relative: 7.1 %
Neutro Abs: 4256 cells/uL (ref 1500–7800)
Neutrophils Relative %: 66.5 %
Platelets: 300 10*3/uL (ref 140–400)
RBC: 5.24 10*6/uL — ABNORMAL HIGH (ref 3.80–5.10)
RDW: 12.3 % (ref 11.0–15.0)
Total Lymphocyte: 25.3 %
WBC: 6.4 10*3/uL (ref 3.8–10.8)

## 2022-07-19 LAB — LIPID PANEL
Cholesterol: 137 mg/dL (ref <200)
HDL: 48 mg/dL — ABNORMAL LOW (ref >=50)
LDL Cholesterol (Calc): 75 mg/dL (calc)
Non-HDL Cholesterol (Calc): 89 mg/dL (calc) (ref <130)
Total CHOL/HDL Ratio: 2.9 (calc) (ref <5.0)
Triglycerides: 68 mg/dL (ref <150)

## 2022-07-19 LAB — HEMOGLOBIN A1C
Hgb A1c MFr Bld: 6.1 % of total Hgb — ABNORMAL HIGH (ref <5.7)
Mean Plasma Glucose: 128 mg/dL
eAG (mmol/L): 7.1 mmol/L

## 2022-07-20 LAB — CYTOLOGY - PAP
Comment: NEGATIVE
Diagnosis: NEGATIVE
High risk HPV: NEGATIVE

## 2022-07-28 ENCOUNTER — Other Ambulatory Visit: Payer: Self-pay | Admitting: Medical-Surgical

## 2022-12-13 ENCOUNTER — Telehealth: Payer: Self-pay | Admitting: Medical-Surgical

## 2022-12-13 NOTE — Telephone Encounter (Signed)
Per Lindi Adie being out of office reschedule patient

## 2022-12-27 ENCOUNTER — Ambulatory Visit: Payer: BC Managed Care – PPO | Admitting: Hematology and Oncology

## 2023-01-11 NOTE — Progress Notes (Signed)
Patient Care Team: Christen Butter, NP as PCP - General (Nurse Practitioner) Emelia Loron, MD as Consulting Physician (General Surgery) Serena Croissant, MD as Consulting Physician (Hematology and Oncology) Lonie Peak, MD as Attending Physician (Radiation Oncology)  DIAGNOSIS: No diagnosis found.  SUMMARY OF ONCOLOGIC HISTORY: Oncology History  Malignant neoplasm of upper-outer quadrant of left breast in female, estrogen receptor positive  09/12/2018 Initial Diagnosis   Palpable left breast mass at 2:30 position by ultrasound measured 1.6 cm, calcifications spanning 5.9 cm, 3 abnormal lymph nodes, multiple masses and calcifications biopsy of the mass revealed grade 2 IDC plus DCIS with lymphovascular invasion, ER 100%, PR 60%, Ki-67 30%, HER-2 negative IHC 1+, pathology of calcifications is pending, biopsy of lymph nodes is discordant benign, T1cN0 stage Ia   09/19/2018 Cancer Staging   Staging form: Breast, AJCC 8th Edition - Clinical stage from 09/19/2018: Stage IA (cT1c(m), cN0, cM0, G2, ER+, PR+, HER2-) - Signed by Serena Croissant, MD on 09/19/2018   09/2018 - 09/2025 Anti-estrogen oral therapy   Letrozole 2.5 mg and discontinued in 12/2018. Anastrozole 1 mg started 07/2019.    11/13/2018 Surgery   Left mastectomy Dwain Sarna) [phone]): Grade 2 IDC, 1.6 cm, with intermediate grade DCIS, margins negative, lymphovascular invasion present, 1/7 lymph nodes positive, MammaPrint high risk, ER 100%, PR 30%, HER-2 negative, Ki-67 30% T1CN1A stage Ia   11/13/2018 Cancer Staging   Staging form: Breast, AJCC 8th Edition - Pathologic stage from 11/13/2018: Stage IA (pT1c, pN1a, cM0, G2, ER+, PR+, HER2-) - Signed by Loa Socks, NP on 11/28/2018   11/13/2018 Oncotype testing   MammaPrint results reveal a high risk luminal type-B with a 94.6% predicted benefit of treatment at 5 years with both chemotherapy and hormonal therapy.     12/17/2018 - 04/22/2019 Chemotherapy   Adjuvant  chemo with [facility] Taxol   06/06/2019 - 07/23/2019 Radiation Therapy   The patient initially received a dose of 50.4 Gy in 28 fractions to the breast and 50.4 Gy in 28 fractions to the left supraclavicular region using whole-breast tangent fields. This was delivered using a 3-D conformal technique. The pt received a boost delivering an additional 10 Gy in 5 fractions using a electron boost with electrons. The total dose was 110.8 Gy.    08/2019 -  Anti-estrogen oral therapy   Anastrozole     CHIEF COMPLIANT: Follow-up of left breast cancer on anastrozole     INTERVAL HISTORY: Heather [name] is a  62 y.o. with above-mentioned history of left breast cancer treated with mastectomy, adjuvant chemotherapy, radiation, and who is currently on antiestrogen therapy with anastrozole.    ALLERGIES:  has No Known Allergies.  MEDICATIONS:  Current Outpatient Medications  Medication Sig Dispense Refill   anastrozole (ARIMIDEX) 1 MG tablet Take 1 tablet (1 mg total) by mouth daily. 90 tablet 3   atorvastatin (LIPITOR) 20 MG tablet TAKE 1 TABLET BY MOUTH EVERY DAY 90 tablet 3   lisinopril-hydrochlorothiazide (ZESTORETIC) 10-12.5 MG tablet TAKE 1 TABLET BY MOUTH EVERY DAY. 90 tablet 1   No current facility-administered medications for this visit.   Facility-Administered Medications Ordered in Other Visits  Medication Dose Route Frequency Provider Last Rate Last Admin   sodium chloride flush (NS) 0.9 % injection 10 mL  10 mL Intravenous PRN Serena Croissant, MD   10 mL at 12/17/18 0907    PHYSICAL EXAMINATION: ECOG PERFORMANCE STATUS: {CHL ONC ECOG PS:[id number]}  There were no vitals filed for this visit. There were no vitals  filed for this visit.  BREAST:*** No palpable masses or nodules in either right or left breasts. No palpable axillary supraclavicular or infraclavicular adenopathy no breast tenderness or nipple discharge. (exam performed in the presence of a chaperone)  LABORATORY  DATA:  I have reviewed the data as listed    Latest Ref Rng & Units 07/18/2022   12:00 AM 07/19/2021   12:00 AM 02/19/2021    9:00 AM  CMP  Glucose 65 - 99 mg/dL 621  95  93   BUN 7 - 25 mg/dL 19  13  12    Creatinine 0.50 - 1.05 mg/dL 3.08  6.57  8.46   Sodium 135 - 146 mmol/L 139  140  140   Potassium 3.5 - 5.3 mmol/L 4.1  4.5  4.3   Chloride 98 - 110 mmol/L 101  101  106   CO2 20 - 32 mmol/L 29  31  28    Calcium 8.6 - 10.4 mg/dL 96.2  95.2  9.6   Total Protein 6.1 - 8.1 g/dL 6.8  7.0    Total Bilirubin 0.2 - 1.2 mg/dL 0.6  0.6    AST 10 - 35 U/L 28  29    ALT 6 - 29 U/L 16  10      Lab Results  Component Value Date   WBC 6.4 07/18/2022   HGB 14.0 07/18/2022   HCT 44.1 07/18/2022   MCV 84.2 07/18/2022   PLT 300 07/18/2022   NEUTROABS 4,256 07/18/2022    ASSESSMENT & PLAN:  No problem-specific Assessment & Plan notes found for this encounter.    No orders of the defined types were placed in this encounter.  The patient has a good understanding of the overall plan. she agrees with it. she will call with any problems that may develop before the next visit here. Total time spent: 30 mins including face to face time and time spent for planning, charting and co-ordination of care   Sherlyn Lick, CMA 01/11/23    I Janan Ridge am acting as a Neurosurgeon for [employer]  ***

## 2023-01-12 ENCOUNTER — Inpatient Hospital Stay: Payer: BC Managed Care – PPO | Attending: Hematology and Oncology | Admitting: Hematology and Oncology

## 2023-01-12 VITALS — BP 149/72 | HR 97 | Temp 97.2°F | Resp 18 | Ht 66.0 in | Wt 113.2 lb

## 2023-01-12 DIAGNOSIS — C50412 Malignant neoplasm of upper-outer quadrant of left female breast: Secondary | ICD-10-CM

## 2023-01-12 DIAGNOSIS — Z79811 Long term (current) use of aromatase inhibitors: Secondary | ICD-10-CM | POA: Insufficient documentation

## 2023-01-12 DIAGNOSIS — Z170 Estrogen receptor positive status [ER+]: Secondary | ICD-10-CM | POA: Diagnosis not present

## 2023-01-12 DIAGNOSIS — Z780 Asymptomatic menopausal state: Secondary | ICD-10-CM | POA: Diagnosis not present

## 2023-01-12 DIAGNOSIS — Z79899 Other long term (current) drug therapy: Secondary | ICD-10-CM | POA: Diagnosis not present

## 2023-01-12 MED ORDER — ANASTROZOLE 1 MG PO TABS: 1.0000 mg | ORAL_TABLET | Freq: Every day | ORAL | 3 refills | Status: DC

## 2023-01-12 NOTE — Assessment & Plan Note (Addendum)
11/13/2018: Left mastectomy: Grade 2 IDC, 1.6 cm, with intermediate grade DCIS, margins negative, lymphovascular invasion present, 1/7 lymph nodes positive, MammaPrint high risk, ER 100%, PR 30%, HER-2 negative, Ki-67 30% T1CN1A stage Ia   Treatment plan: 1.  Adjuvant chemotherapy with dose since he remains on Cytoxan x4 followed by Taxol weekly x10 completed 04/22/2019  2.  Followed by adjuvant radiation therapy 06/06/2019-07/19/2019 3.  Followed by adjuvant antiestrogen therapy ------------------------------------------------------------------------------------------------------------------ Treatment plan: Adjuvant antiestrogen therapy with anastrozole 1 mg p.o. daily started November 2020, I recommended that Heather [name] continue it for 7 years.   Anastrozole Toxicities: Denies any major hot flashes or myalgias.   Breast Cancer Surveillance: 1. Mammograms 05/31/2022: Benign, breast density category D 2/ Breast Exam: 01/12/2023: Benign 3. Bone Density 01/17/20: T score -1.1, I ordered a new bone density test.  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   Return to clinic in 1 year for follow-up

## 2023-01-15 ENCOUNTER — Other Ambulatory Visit: Payer: Self-pay

## 2023-01-17 ENCOUNTER — Ambulatory Visit: Payer: BC Managed Care – PPO | Admitting: Medical-Surgical

## 2023-01-23 ENCOUNTER — Other Ambulatory Visit: Payer: Self-pay | Admitting: Medical-Surgical

## 2023-01-23 DIAGNOSIS — I10 Essential (primary) hypertension: Secondary | ICD-10-CM

## 2023-01-25 ENCOUNTER — Ambulatory Visit: Payer: BC Managed Care – PPO | Admitting: Medical-Surgical

## 2023-01-25 ENCOUNTER — Encounter: Payer: Self-pay | Admitting: Medical-Surgical

## 2023-01-25 VITALS — BP 123/69 | HR 90 | Resp 20 | Ht 66.0 in | Wt 111.9 lb

## 2023-01-25 DIAGNOSIS — R7309 Other abnormal glucose: Secondary | ICD-10-CM

## 2023-01-25 DIAGNOSIS — I10 Essential (primary) hypertension: Secondary | ICD-10-CM | POA: Diagnosis not present

## 2023-01-25 DIAGNOSIS — E559 Vitamin D deficiency, unspecified: Secondary | ICD-10-CM

## 2023-01-25 NOTE — Progress Notes (Signed)
        Established patient visit  History, exam, impression, and plan:  1. Essential hypertension Very pleasant 62 year old female presenting today with a history of hypertension currently well-controlled on lisinopril-hydrochlorothiazide 10-12.5 mg daily.  Following a low-sodium diet and staying physically active.  Denies CP, SOB, LE edema, HA, vision change, palpitations, dizziness, and unusual headaches.  HRR, S1/S2 normal.  No peripheral edema.  Lungs CTA, respirations even and unlabored.  Blood pressure at goal today.  Continue lisinopril-hydrochlorothiazide as prescribed.  2. Elevated hemoglobin A1c History of elevated hemoglobin A1c not currently on any medications.  Rechecking today. - Hemoglobin A1c  3. Hypercalcemia Last calcium level 10.6 checked approximately 6 months ago.  She does have a calcium/vitamin D supplement that she takes on occasion but not daily.  Rechecking calcium today.  4. Vitamin D deficiency As noted above, has a supplement that includes calcium and vitamin D that she takes on occasion.  Rechecking vitamin D today. - VITAMIN D 25 Hydroxy (Vit-D Deficiency, Fractures)  Procedures performed this visit: None.  Return in about 6 months (around 07/27/2023) for prediabetes/HLD/HTN follow up.  __________________________________ Thayer Ohm, DNP, APRN, FNP-BC Primary Care and Sports Medicine [facility] [place]

## 2023-01-26 ENCOUNTER — Encounter: Payer: Self-pay | Admitting: Hematology and Oncology

## 2023-01-26 LAB — COMPLETE METABOLIC PANEL WITH GFR
AG Ratio: 2.4 (calc) (ref 1.0–2.5)
ALT: 12 U/L (ref 6–29)
AST: 24 U/L (ref 10–35)
Albumin: 4.6 g/dL (ref 3.6–5.1)
Alkaline phosphatase (APISO): 82 U/L (ref 37–153)
BUN: 17 mg/dL (ref 7–25)
CO2: 31 mmol/L (ref 20–32)
Calcium: 10.8 mg/dL — ABNORMAL HIGH (ref 8.6–10.4)
Chloride: 101 mmol/L (ref 98–110)
Creat: 0.89 mg/dL (ref 0.50–1.05)
Globulin: 1.9 g/dL (calc) (ref 1.9–3.7)
Glucose, Bld: 111 mg/dL — ABNORMAL HIGH (ref 65–99)
Potassium: 4.5 mmol/L (ref 3.5–5.3)
Sodium: 141 mmol/L (ref 135–146)
Total Bilirubin: 0.7 mg/dL (ref 0.2–1.2)
Total Protein: 6.5 g/dL (ref 6.1–8.1)
eGFR: 73 mL/min/{1.73_m2} (ref >=60)

## 2023-01-26 LAB — HEMOGLOBIN A1C
Hgb A1c MFr Bld: 6 % of total Hgb — ABNORMAL HIGH (ref <5.7)
Mean Plasma Glucose: 126 mg/dL
eAG (mmol/L): 7 mmol/L

## 2023-01-26 LAB — VITAMIN D 25 HYDROXY (VIT D DEFICIENCY, FRACTURES): Vit D, 25-Hydroxy: 30 ng/mL (ref 30–100)

## 2023-02-15 ENCOUNTER — Other Ambulatory Visit: Payer: Self-pay | Admitting: Hematology and Oncology

## 2023-05-17 ENCOUNTER — Other Ambulatory Visit: Payer: Self-pay | Admitting: Medical-Surgical

## 2023-05-17 DIAGNOSIS — Z0000 Encounter for general adult medical examination without abnormal findings: Secondary | ICD-10-CM

## 2023-06-01 ENCOUNTER — Ambulatory Visit
Admission: RE | Admit: 2023-06-01 | Discharge: 2023-06-01 | Disposition: A | Discharge status: Home / Self Care | Payer: BC Managed Care – PPO | Source: Ambulatory Visit | Attending: Medical-Surgical | Admitting: Medical-Surgical

## 2023-06-01 DIAGNOSIS — Z0000 Encounter for general adult medical examination without abnormal findings: Secondary | ICD-10-CM

## 2023-07-28 ENCOUNTER — Ambulatory Visit: Payer: BC Managed Care – PPO | Admitting: Medical-Surgical

## 2023-07-30 ENCOUNTER — Other Ambulatory Visit: Payer: Self-pay | Admitting: Medical-Surgical

## 2023-07-30 DIAGNOSIS — I10 Essential (primary) hypertension: Secondary | ICD-10-CM

## 2023-08-17 ENCOUNTER — Ambulatory Visit: Payer: BC Managed Care – PPO | Admitting: Medical-Surgical

## 2023-09-07 ENCOUNTER — Ambulatory Visit: Payer: BC Managed Care – PPO | Admitting: Medical-Surgical

## 2023-09-07 ENCOUNTER — Encounter: Payer: Self-pay | Admitting: Medical-Surgical

## 2023-09-07 VITALS — BP 120/71 | HR 89 | Resp 20 | Ht 66.0 in | Wt 114.7 lb

## 2023-09-07 DIAGNOSIS — E559 Vitamin D deficiency, unspecified: Secondary | ICD-10-CM

## 2023-09-07 DIAGNOSIS — Z23 Encounter for immunization: Secondary | ICD-10-CM

## 2023-09-07 DIAGNOSIS — R7309 Other abnormal glucose: Secondary | ICD-10-CM | POA: Diagnosis not present

## 2023-09-07 DIAGNOSIS — Z1211 Encounter for screening for malignant neoplasm of colon: Secondary | ICD-10-CM

## 2023-09-07 DIAGNOSIS — I10 Essential (primary) hypertension: Secondary | ICD-10-CM

## 2023-09-07 DIAGNOSIS — L299 Pruritus, unspecified: Secondary | ICD-10-CM | POA: Diagnosis not present

## 2023-09-07 LAB — POCT GLYCOSYLATED HEMOGLOBIN (HGB A1C)
HbA1c, POC (prediabetic range): 5.9 % (ref 5.7–6.4)
Hemoglobin A1C: 5.9 % — AB (ref 4.0–5.6)

## 2023-09-07 MED ORDER — CETIRIZINE HCL 10 MG PO TABS: 10.0000 mg | ORAL_TABLET | Freq: Every day | ORAL | 11 refills | Status: DC

## 2023-09-07 MED ORDER — LISINOPRIL-HYDROCHLOROTHIAZIDE 10-12.5 MG PO TABS
ORAL_TABLET | ORAL | 3 refills | Status: DC
Start: 2023-09-07 — End: 2024-05-07

## 2023-09-07 MED ORDER — ATORVASTATIN CALCIUM 20 MG PO TABS: 20.0000 mg | ORAL_TABLET | Freq: Every day | ORAL | 3 refills | Status: DC

## 2023-09-07 MED ORDER — LISINOPRIL-HYDROCHLOROTHIAZIDE 10-12.5 MG PO TABS
ORAL_TABLET | ORAL | 0 refills | Status: DC
Start: 2023-09-07 — End: 2023-09-07

## 2023-09-07 MED ORDER — ATORVASTATIN CALCIUM 20 MG PO TABS: 20.0000 mg | ORAL_TABLET | Freq: Every day | ORAL | 0 refills | Status: DC

## 2023-09-07 NOTE — Progress Notes (Signed)
        Established patient visit  History, exam, impression, and plan:  1. Essential hypertension Pleasant 62 year old female presenting today with a history of essential hypertension that is currently managed with lisinopril-hydrochlorothiazide 10-12.5 mg once daily.  Not monitoring blood pressures at home.  Follows a low-sodium heart healthy diet.  Staying physically active as much as tolerated.  Denies concerning symptoms today.  Cardiopulmonary exam normal.  Checking labs as below.  Blood pressure at goal.  Continue lisinopril-HCTZ as prescribed. - CBC with Differential/Platelet - CMP14+EGFR - Lipid panel - lisinopril-hydrochlorothiazide (ZESTORETIC) 10-12.5 MG tablet; TAKE 1 TABLET BY MOUTH EVERY DAY.  Dispense: 90 tablet; Refill: 3  2. Pruritus Notes that she is had periods of itching off and on for the last 3 or 4 months.  The itching can be generalized but often affects her neck, shoulders, and arms.  Denies any new medications, foods, materials, cosmetics, and detergents.  Does note that she started a new body wash that has peppermint at about the time that her itching started.  She has switched to [facility] body wash in the last week or 2 and noted an improvement in the frequency of the itching but she is still having these episodes.  On exam, no identifiable rash or erythema to the area.  She has been using over-the-counter cortisone which provides relief.  Unclear etiology.  Consider contact dermatitis versus atopic dermatitis.  Continue to use hypoallergenic body wash.  Consider switching to hypoallergenic laundry detergent and softener.  Checking labs as below for any metabolic cause.  Recommend adding Zyrtec 10 mg daily.  Okay to use cortisone cream twice daily for up to 14 days but will need to take a 2-week break to avoid discoloration and skin thinning. - TSH - Sed Rate (ESR) - C-reactive protein  3. Elevated hemoglobin A1c History of elevated hemoglobin A1c that was 6.0%  approximately 6 months ago.  Has been working hard to keep this in check without adding more medications.  Recheck today at 5.9%, still prediabetic but moving in the right direction. - POCT HgB A1C  4. Hypercalcemia Checking CMP today.  5. Vitamin D deficiency Not currently taking vitamin D supplementation.  Up-to-date on vitamin D yearly checks.  Last 2 have been normal so deferring today.  6. Colon cancer screening Discussed recommendations for colon cancer screening.  We did review Cologuard in detail.  After discussion she decided to go ahead with a colonoscopy since she has never had 1.  Referral to GI placed. - Ambulatory referral to Gastroenterology  7. Need for influenza vaccination Flu vaccine given in office today. - Flu vaccine trivalent PF, 6mos and older(Flulaval,Afluria,Fluarix,Fluzone)  Procedures performed this visit: None.  Return in about 6 months (around 03/07/2024) for preDM/HTN/HLD follow up.  __________________________________ Thayer Ohm, DNP, APRN, FNP-BC Primary Care and Sports Medicine [facility] [place]

## 2023-09-07 NOTE — Patient Instructions (Signed)
Pruritus Pruritus is an itchy feeling on the skin. One of the most common causes is dry skin, but many different things can cause itching. Most cases of itching do not require medical attention. Sometimes itchy skin can turn into a rash or a secondary infection. Follow these instructions at home: Skin care  Do not use scented soaps, detergents, perfumes, and cosmetic products. Instead, use gentle, unscented versions of these items. Apply moisturizing creams to your skin frequently, at least twice daily. Apply immediately after bathing while skin is still wet. Take medicines or apply medicated creams only as told by your health care provider. This may include: Corticosteroid cream or topical calcineurin inhibitor. Anti-itch lotions containing urea, camphor, or menthol. Oral antihistamines. Do not take hot showers or baths, which can make itching worse. A short, cool shower may help with itching as long as you apply moisturizing lotion after the shower. Apply a cool, wet cloth (cool compress) to the affected areas. You may take lukewarm baths with one of the following: Epsom salts. You can get these at your local pharmacy or grocery store. Follow the instructions on the packaging. Baking soda. Pour a small amount into the bath as told by your health care provider. Colloidal oatmeal. You can get this at your local pharmacy or grocery store. Follow the instructions on the packaging. Do not scratch your skin. General instructions Avoid wearing tight clothes. Keep a journal to help find out what is causing your itching. Write down: What you eat and drink. What cosmetic products you use. What soaps or detergents you use. What you wear, including jewelry. Use a humidifier. This keeps the air moist, which helps to prevent dry skin. Be aware of any changes in your itchiness. Tell your health care provider about any changes. Contact a health care provider if: The itching does not go away after  several days. You notice redness, warmth, or drainage on the skin where you have scratched. You are unusually thirsty or urinating more than normal. Your skin tingles or feels numb. Your skin or the white parts of your eyes turn yellow (jaundice). You feel weak. You have any of the following: Night sweats. Tiredness (fatigue). Weight loss. Abdominal pain. Summary Pruritus is an itchy feeling on the skin. One of the most common causes is dry skin, but many different conditions and factors can cause itching. Apply moisturizing creams to your skin frequently, at least twice daily. Apply immediately after bathing while skin is still wet. Take medicines or apply medicated creams only as told by your health care provider. Do not take hot showers or baths. Do not use scented soaps, detergents, perfumes, or cosmetic products. Keep a journal to help find out what is causing your itching. This information is not intended to replace advice given to you by your health care provider. Make sure you discuss any questions you have with your health care provider. Document Revised: 10/27/2021 Document Reviewed: 10/27/2021 Elsevier Patient Education  2024 [employer].

## 2023-09-08 ENCOUNTER — Encounter: Payer: Self-pay | Admitting: Medical-Surgical

## 2023-09-08 LAB — CBC WITH DIFFERENTIAL/PLATELET
Basophils Absolute: 0 10*3/uL (ref 0.0–0.2)
Basos: 1 %
EOS (ABSOLUTE): 0.1 10*3/uL (ref 0.0–0.4)
Eos: 1 %
Hematocrit: 43.3 % (ref 34.0–46.6)
Hemoglobin: 13.1 g/dL (ref 11.1–15.9)
Immature Grans (Abs): 0 10*3/uL (ref 0.0–0.1)
Immature Granulocytes: 0 %
Lymphocytes Absolute: 1.4 10*3/uL (ref 0.7–3.1)
Lymphs: 22 %
MCH: 26.5 pg — ABNORMAL LOW (ref 26.6–33.0)
MCHC: 30.3 g/dL — ABNORMAL LOW (ref 31.5–35.7)
MCV: 88 fL (ref 79–97)
Monocytes Absolute: 0.5 10*3/uL (ref 0.1–0.9)
Monocytes: 7 %
Neutrophils Absolute: 4.5 10*3/uL (ref 1.4–7.0)
Neutrophils: 69 %
Platelets: 307 10*3/uL (ref 150–450)
RBC: 4.95 x10E6/uL (ref 3.77–5.28)
RDW: 12.9 % (ref 11.7–15.4)
WBC: 6.5 10*3/uL (ref 3.4–10.8)

## 2023-09-08 LAB — LIPID PANEL
Chol/HDL Ratio: 2.5 {ratio} (ref 0.0–4.4)
Cholesterol, Total: 128 mg/dL (ref 100–199)
HDL: 52 mg/dL (ref >39)
LDL Chol Calc (NIH): 65 mg/dL (ref 0–99)
Triglycerides: 45 mg/dL (ref 0–149)
VLDL Cholesterol Cal: 11 mg/dL (ref 5–40)

## 2023-09-08 LAB — CMP14+EGFR
ALT: 16 [IU]/L (ref 0–32)
AST: 31 [IU]/L (ref 0–40)
Albumin: 4.5 g/dL (ref 3.9–4.9)
Alkaline Phosphatase: 94 [IU]/L (ref 44–121)
BUN/Creatinine Ratio: 19 (ref 12–28)
BUN: 15 mg/dL (ref 8–27)
Bilirubin Total: 0.4 mg/dL (ref 0.0–1.2)
CO2: 26 mmol/L (ref 20–29)
Calcium: 10.2 mg/dL (ref 8.7–10.3)
Chloride: 100 mmol/L (ref 96–106)
Creatinine, Ser: 0.78 mg/dL (ref 0.57–1.00)
Globulin, Total: 1.7 g/dL (ref 1.5–4.5)
Glucose: 87 mg/dL (ref 70–99)
Potassium: 4.3 mmol/L (ref 3.5–5.2)
Sodium: 139 mmol/L (ref 134–144)
Total Protein: 6.2 g/dL (ref 6.0–8.5)
eGFR: 85 mL/min/{1.73_m2} (ref >59)

## 2023-09-08 LAB — SEDIMENTATION RATE: Sed Rate: 6 mm/h (ref 0–40)

## 2023-09-08 LAB — C-REACTIVE PROTEIN: CRP: <1 mg/L (ref 0–10)

## 2023-09-08 LAB — TSH: TSH: 0.828 u[IU]/mL (ref 0.450–4.500)

## 2024-01-15 ENCOUNTER — Inpatient Hospital Stay: Payer: BC Managed Care – PPO | Attending: Hematology and Oncology | Admitting: Hematology and Oncology

## 2024-01-15 VITALS — BP 162/83 | HR 103 | Temp 98.1°F | Resp 18 | Ht 66.0 in | Wt 119.0 lb

## 2024-01-15 DIAGNOSIS — Z170 Estrogen receptor positive status [ER+]: Secondary | ICD-10-CM | POA: Diagnosis not present

## 2024-01-15 DIAGNOSIS — C50412 Malignant neoplasm of upper-outer quadrant of left female breast: Secondary | ICD-10-CM

## 2024-01-15 DIAGNOSIS — Z923 Personal history of irradiation: Secondary | ICD-10-CM | POA: Diagnosis not present

## 2024-01-15 DIAGNOSIS — Z79811 Long term (current) use of aromatase inhibitors: Secondary | ICD-10-CM | POA: Insufficient documentation

## 2024-01-15 DIAGNOSIS — Z9221 Personal history of antineoplastic chemotherapy: Secondary | ICD-10-CM | POA: Diagnosis not present

## 2024-01-15 DIAGNOSIS — Z9012 Acquired absence of left breast and nipple: Secondary | ICD-10-CM | POA: Diagnosis not present

## 2024-01-15 MED ORDER — ANASTROZOLE 1 MG PO TABS: 1.0000 mg | ORAL_TABLET | Freq: Every day | ORAL | 3 refills | Status: AC

## 2024-01-15 NOTE — Progress Notes (Signed)
 Patient Care Team: Christen Butter, NP as PCP - General (Nurse Practitioner) Emelia Loron, MD as Consulting Physician (General Surgery) Serena Croissant, MD as Consulting Physician (Hematology and Oncology) Lonie Peak, MD as Attending Physician (Radiation Oncology)  DIAGNOSIS:  Encounter Diagnosis  Name Primary?   Malignant neoplasm of upper-outer quadrant of left breast in female, estrogen receptor positive (HCC) Yes    SUMMARY OF ONCOLOGIC HISTORY: Oncology History  Malignant neoplasm of upper-outer quadrant of left breast in female, estrogen receptor positive (HCC)  09/12/2018 Initial Diagnosis   Palpable left breast mass at 2:30 position by ultrasound measured 1.6 cm, calcifications spanning 5.9 cm, 3 abnormal lymph nodes, multiple masses and calcifications biopsy of the mass revealed grade 2 IDC plus DCIS with lymphovascular invasion, ER 100%, PR 60%, Ki-67 30%, HER-2 negative IHC 1+, pathology of calcifications is pending, biopsy of lymph nodes is discordant benign, T1cN0 stage Ia   09/19/2018 Cancer Staging   Staging form: Breast, AJCC 8th Edition - Clinical stage from 09/19/2018: Stage IA (cT1c(m), cN0, cM0, G2, ER+, PR+, HER2-) - Signed by Serena Croissant, MD on 09/19/2018   09/2018 - 09/2025 Anti-estrogen oral therapy   Letrozole 2.5 mg and discontinued in 12/2018. Anastrozole 1 mg started 07/2019.    11/13/2018 Surgery   Left mastectomy Dwain Sarna) [phone]): Grade 2 IDC, 1.6 cm, with intermediate grade DCIS, margins negative, lymphovascular invasion present, 1/7 lymph nodes positive, MammaPrint high risk, ER 100%, PR 30%, HER-2 negative, Ki-67 30% T1CN1A stage Ia   11/13/2018 Cancer Staging   Staging form: Breast, AJCC 8th Edition - Pathologic stage from 11/13/2018: Stage IA (pT1c, pN1a, cM0, G2, ER+, PR+, HER2-) - Signed by Loa Socks, NP on 11/28/2018   11/13/2018 Oncotype testing   MammaPrint results reveal a high risk luminal type-B with a 94.6%  predicted benefit of treatment at 5 years with both chemotherapy and hormonal therapy.     12/17/2018 - 04/22/2019 Chemotherapy   Adjuvant chemo with [facility] Taxol   06/06/2019 - 07/23/2019 Radiation Therapy   The patient initially received a dose of 50.4 Gy in 28 fractions to the breast and 50.4 Gy in 28 fractions to the left supraclavicular region using whole-breast tangent fields. This was delivered using a 3-D conformal technique. The pt received a boost delivering an additional 10 Gy in 5 fractions using a electron boost with electrons. The total dose was 110.8 Gy.    08/2019 -  Anti-estrogen oral therapy   Anastrozole     CHIEF COMPLIANT: Surveillance of breast cancer  HISTORY OF PRESENT ILLNESS:   History of Present Illness The patient, with a history of a condition related to bone density and breast cancer risk, presents with concerns about her inability to gain weight despite previous discussions about diet. She reports feeling well overall, with good energy levels and the ability to function normally. She is currently working part-time at [employer], which she finds fulfilling and not overly stressful. The patient is also considering a plastic surgery procedure on her other breast, following a previous procedure on one breast. She expresses a preference for a different surgeon for this potential procedure.     ALLERGIES:  has no known allergies.  MEDICATIONS:  Current Outpatient Medications  Medication Sig Dispense Refill   anastrozole (ARIMIDEX) 1 MG tablet Take 1 tablet (1 mg total) by mouth daily. 90 tablet 3   atorvastatin (LIPITOR) 20 MG tablet Take 1 tablet (20 mg total) by mouth daily. 90 tablet 3   lisinopril-hydrochlorothiazide (ZESTORETIC)  10-12.5 MG tablet TAKE 1 TABLET BY MOUTH EVERY DAY. 90 tablet 3   No current facility-administered medications for this visit.   Facility-Administered Medications Ordered in Other Visits  Medication Dose Route Frequency  Provider Last Rate Last Admin   sodium chloride flush (NS) 0.9 % injection 10 mL  10 mL Intravenous PRN Serena Croissant, MD   10 mL at 12/17/18 0907    PHYSICAL EXAMINATION: ECOG PERFORMANCE STATUS: 1 - Symptomatic but completely ambulatory  Vitals:   01/15/24 0826 01/15/24 0827  BP: (!) 150/69 (!) 162/83  Pulse: (!) 103   Resp: 18   Temp: 98.1 F (36.7 C)   SpO2: 100%    Filed Weights   01/15/24 0826  Weight: 119 lb (54 kg)    Physical Exam No palpable lumps or nodules in bilateral chest wall or axilla.  Left breast reconstructed  (exam performed in the presence of a chaperone)  LABORATORY DATA:  I have reviewed the data as listed    Latest Ref Rng & Units 09/07/2023   10:30 AM 01/25/2023    8:49 AM 07/18/2022   12:00 AM  CMP  Glucose 70 - 99 mg/dL 87  865  784   BUN 8 - 27 mg/dL 15  17  19    Creatinine 0.57 - 1.00 mg/dL 6.96  2.95  2.84   Sodium 134 - 144 mmol/L 139  141  139   Potassium 3.5 - 5.2 mmol/L 4.3  4.5  4.1   Chloride 96 - 106 mmol/L 100  101  101   CO2 20 - 29 mmol/L 26  31  29    Calcium 8.7 - 10.3 mg/dL 13.2  44.0  10.2   Total Protein 6.0 - 8.5 g/dL 6.2  6.5  6.8   Total Bilirubin 0.0 - 1.2 mg/dL 0.4  0.7  0.6   Alkaline Phos 44 - 121 IU/L 94     AST 0 - 40 IU/L 31  24  28    ALT 0 - 32 IU/L 16  12  16      Lab Results  Component Value Date   WBC 6.5 09/07/2023   HGB 13.1 09/07/2023   HCT 43.3 09/07/2023   MCV 88 09/07/2023   PLT 307 09/07/2023   NEUTROABS 4.5 09/07/2023    ASSESSMENT & PLAN:  Malignant neoplasm of upper-outer quadrant of left breast in female, estrogen receptor positive (HCC) 11/13/2018: Left mastectomy: Grade 2 IDC, 1.6 cm, with intermediate grade DCIS, margins negative, lymphovascular invasion present, 1/7 lymph nodes positive, MammaPrint high risk, ER 100%, PR 30%, HER-2 negative, Ki-67 30% T1CN1A stage Ia   Treatment plan: 1.  Adjuvant chemotherapy with dose since he remains on Cytoxan x4 followed by Taxol weekly x10  completed 04/22/2019  2.  Followed by adjuvant radiation therapy 06/06/2019-07/19/2019 3.  Followed by adjuvant antiestrogen therapy ------------------------------------------------------------------------------------------------------------------ Treatment plan: Adjuvant antiestrogen therapy with anastrozole 1 mg p.o. daily started November 2020, I recommended that she continue it for 7 years.   Anastrozole Toxicities: Denies any major hot flashes or myalgias.   Breast Cancer Surveillance: 1. Mammograms 06/01/2023: Benign, breast density category D 2/ Breast Exam: 01/15/2024 benign 3. Bone Density 01/17/20: T score -1.1, I ordered a new bone density test.   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  She went back to doing 3 days a week part-time at the [employer] office.   Return to clinic in 1 year for follow-up ------------------------------------- Assessment and Plan Assessment & Plan  Breast reconstruction consideration Considering breast reconstruction for the contralateral breast. Prefers consulting a different surgeon. Comfort with the decision and surgeon is crucial. - If interested, arrange consultation with Dr. Valeta Gaudier for breast reconstruction discussion.  Hypertension Experiences elevated blood pressure readings in the clinic, likely due to anxiety. Advised to monitor blood pressure at home under relaxed conditions. - Monitor blood pressure at home under relaxed conditions and report any concerns.      No orders of the defined types were placed in this encounter.  The patient has a good understanding of the overall plan. she agrees with it. she will call with any problems that may develop before the next visit here. Total time spent: 30 mins including face to face time and time spent for planning, charting and co-ordination of care   Viinay K Sharlisa Hollifield, MD 01/15/24

## 2024-01-15 NOTE — Assessment & Plan Note (Signed)
 11/13/2018: Left mastectomy: Grade 2 IDC, 1.6 cm, with intermediate grade DCIS, margins negative, lymphovascular invasion present, 1/7 lymph nodes positive, MammaPrint high risk, ER 100%, PR 30%, HER-2 negative, Ki-67 30% T1CN1A stage Ia   Treatment plan: 1.  Adjuvant chemotherapy with dose since he remains on Cytoxan x4 followed by Taxol weekly x10 completed 04/22/2019  2.  Followed by adjuvant radiation therapy 06/06/2019-07/19/2019 3.  Followed by adjuvant antiestrogen therapy ------------------------------------------------------------------------------------------------------------------ Treatment plan: Adjuvant antiestrogen therapy with anastrozole 1 mg p.o. daily started November 2020, I recommended that she continue it for 7 years.   Anastrozole Toxicities: Denies any major hot flashes or myalgias.   Breast Cancer Surveillance: 1. Mammograms 06/01/2023: Benign, breast density category D 2/ Breast Exam: 01/15/2024 benign 3. Bone Density 01/17/20: T score -1.1, I ordered a new bone density test.   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   Return to clinic in 1 year for follow-up

## 2024-02-08 ENCOUNTER — Encounter: Payer: Self-pay | Admitting: Medical-Surgical

## 2024-02-08 ENCOUNTER — Ambulatory Visit

## 2024-02-08 ENCOUNTER — Ambulatory Visit (INDEPENDENT_AMBULATORY_CARE_PROVIDER_SITE_OTHER): Admitting: Medical-Surgical

## 2024-02-08 VITALS — BP 138/75 | HR 113 | Resp 20 | Ht 66.0 in | Wt 105.2 lb

## 2024-02-08 DIAGNOSIS — K5900 Constipation, unspecified: Secondary | ICD-10-CM | POA: Diagnosis not present

## 2024-02-08 DIAGNOSIS — R109 Unspecified abdominal pain: Secondary | ICD-10-CM | POA: Diagnosis not present

## 2024-02-08 DIAGNOSIS — M62838 Other muscle spasm: Secondary | ICD-10-CM

## 2024-02-08 MED ORDER — DOCUSATE SODIUM 100 MG PO CAPS: 100.0000 mg | ORAL_CAPSULE | Freq: Two times a day (BID) | ORAL | 3 refills | Status: DC | PRN

## 2024-02-08 MED ORDER — CYCLOBENZAPRINE HCL 10 MG PO TABS
5.0000 mg | ORAL_TABLET | Freq: Three times a day (TID) | ORAL | 0 refills | Status: DC | PRN
Start: 2024-02-08 — End: 2024-03-28

## 2024-02-08 MED ORDER — MELOXICAM 15 MG PO TABS: 15.0000 mg | ORAL_TABLET | Freq: Every day | ORAL | 0 refills | Status: DC

## 2024-02-08 NOTE — Patient Instructions (Signed)

## 2024-02-08 NOTE — Progress Notes (Signed)
        Established patient visit  History, exam, impression, and plan:  1. Constipation, unspecified constipation type (Primary) Pleasant 63 year old female presenting today with complaint of constipation over the last week.  She normally has a bowel movement daily after having her coffee in the morning however over the last week, she has gone up to 3 days at a time without having a bowel movement.  Notes that she took Senokot yesterday and was able to have a bowel movement today.  Her stool is dark brown but otherwise normal.  Endorses borborygmi, intermittent bloating, excessive flatulence, and poor appetite.  Eating smaller portions and feels fuller faster than usual.  Does have an occasional unexplained cough as well as burning in the chest consistent with heartburn.  Denies melena, hematochezia, nausea, vomiting, fever, and chills.  Abdominal exam is reassuring with no tenderness to palpation or distention.  Abdomen is soft with bowel sounds positive x 4 quadrants.  Discussed the likely cause of constipation being inadequate hydration.  There have been no changes in her diet, activity level, or medications.  Recommend Colace 100 mg twice daily as needed.  Alternatively she can use MiraLAX 17 g daily.  Information provided on dietary recommendations to manage constipation including increased fiber.  We will get a KUB today to rule out residual stool burden. - DG Abd 1 View; Future  2. Muscle spasm Has an area medial to the right scapula along the thoracic spine that is very painful.  This happens mostly at night when she is lying down but she does have some pain intermittently throughout the day.  Not sure if this was related to her GI symptoms or if it was something different.  On exam, she does have an area of the right thoracic paraspinal muscles that feels tense and hard.  Tenderness to palpation in this area noted.  Feel this is consistent with a muscle spasm or muscle strain.  Exercises provided  for thoracic spinal concerns.  Recommend meloxicam 15 mg daily for 2 weeks then daily as needed.  Adding Flexeril to help with pain at night.  Okay to use topical analgesics if desired.  If no improvement in symptoms after 4-6 weeks of conservative measures, return for further evaluation.  Procedures performed this visit: None.  Return for back pain/constipation follow in 4-6 weeks IF no improvement.  __________________________________ Heather Snook, DNP, APRN, FNP-BC Primary Care and Sports Medicine [facility] [place]

## 2024-02-15 ENCOUNTER — Ambulatory Visit: Payer: Self-pay | Admitting: Medical-Surgical

## 2024-03-06 ENCOUNTER — Other Ambulatory Visit: Payer: Self-pay | Admitting: Medical-Surgical

## 2024-03-28 ENCOUNTER — Ambulatory Visit: Payer: Self-pay | Admitting: Medical-Surgical

## 2024-03-28 ENCOUNTER — Ambulatory Visit (INDEPENDENT_AMBULATORY_CARE_PROVIDER_SITE_OTHER): Payer: BC Managed Care – PPO | Admitting: Medical-Surgical

## 2024-03-28 ENCOUNTER — Ambulatory Visit

## 2024-03-28 ENCOUNTER — Encounter: Payer: Self-pay | Admitting: Medical-Surgical

## 2024-03-28 VITALS — BP 137/73 | HR 114 | Resp 20 | Ht 66.0 in | Wt 103.0 lb

## 2024-03-28 DIAGNOSIS — R051 Acute cough: Secondary | ICD-10-CM

## 2024-03-28 DIAGNOSIS — I10 Essential (primary) hypertension: Secondary | ICD-10-CM

## 2024-03-28 DIAGNOSIS — R059 Cough, unspecified: Secondary | ICD-10-CM

## 2024-03-28 DIAGNOSIS — B9689 Other specified bacterial agents as the cause of diseases classified elsewhere: Secondary | ICD-10-CM

## 2024-03-28 DIAGNOSIS — R7309 Other abnormal glucose: Secondary | ICD-10-CM | POA: Diagnosis not present

## 2024-03-28 DIAGNOSIS — J208 Acute bronchitis due to other specified organisms: Secondary | ICD-10-CM

## 2024-03-28 LAB — POCT GLYCOSYLATED HEMOGLOBIN (HGB A1C)
HbA1c, POC (controlled diabetic range): 5.9 % (ref 0.0–7.0)
Hemoglobin A1C: 5.9 % — AB (ref 4.0–5.6)

## 2024-03-28 MED ORDER — AZITHROMYCIN 250 MG PO TABS: ORAL_TABLET | ORAL | 0 refills | Status: AC

## 2024-03-28 MED ORDER — AMOXICILLIN-POT CLAVULANATE 875-125 MG PO TABS: 1.0000 | ORAL_TABLET | Freq: Two times a day (BID) | ORAL | 0 refills | Status: DC

## 2024-03-28 MED ORDER — HYDROCODONE BIT-HOMATROP MBR 5-1.5 MG/5ML PO SOLN: 5.0000 mL | Freq: Three times a day (TID) | ORAL | 0 refills | Status: DC | PRN

## 2024-03-28 MED ORDER — METHYLPREDNISOLONE 4 MG PO TBPK: ORAL_TABLET | ORAL | 0 refills | Status: DC

## 2024-03-28 MED ORDER — BENZONATATE 200 MG PO CAPS: 200.0000 mg | ORAL_CAPSULE | Freq: Three times a day (TID) | ORAL | 0 refills | Status: DC | PRN

## 2024-03-28 NOTE — Progress Notes (Signed)
        Established patient visit  History, exam, impression, and plan:  1. Elevated hemoglobin A1c (Primary) Very pleasant 63 year old female presenting today with a history of prediabetes.  Her last hemoglobin A1c was 5.9% approximately 6 months ago.  She is not currently on medications for management of this and works to maintain control with dietary modifications.  Denies polyuria, polyphagia, and polydipsia.  Recheck of hemoglobin A1c at 5.9% today showing stability.  Recommend continuing dietary modifications. - POCT HgB A1C  2. Essential hypertension She does have a history of essential hypertension that is currently managed with lisinopril -hydrochlorothiazide  10-12.5 mg once daily.  Tolerates medication well without side effects.  Denies any concerning symptoms.  Cardiopulmonary exam is normal today.  Labs were checked in December and patient would like to defer today.  We will check them at her next follow-up visit.  Continue lisinopril -HCTZ as prescribed.  3. Acute cough 4.  Acute bacterial bronchitis Reports that she has had 2 weeks of a nonproductive persistent cough.  No other associated symptoms and no recent sick contacts.  No history of asthma or COPD.  She is a non-smoker.  Denies shortness of breath but admits that her chest hurts in the upper area when she coughs.  Pain not reproducible on palpation.  On auscultation, upper lobes bilaterally with diminished lung sounds and scattered wheezing.  Lower lobes clear.  Slightly tachycardic but O2 sats are good.  Plan for chest x-ray today.  After 2 weeks of symptoms, we will likely start a Z-Pak with a Medrol Dosepak.  Sending in Tessalon Perles and Hycodan for cough management.  Discussed medications that are safe to use over-the-counter in the setting of hypertension such as Coricidin or HBP NyQuil/DayQuil. - DG Chest 2 View; Future  Procedures performed this visit: None.  Return in about 6 months (around 09/27/2024) for chronic  disease follow up.  __________________________________ Zada FREDRIK Palin, DNP, APRN, FNP-BC Primary Care and Sports Medicine [facility] [place]

## 2024-03-29 ENCOUNTER — Telehealth: Payer: Self-pay

## 2024-03-29 NOTE — Telephone Encounter (Signed)
 Copied from CRM [id number]. Topic: Clinical - Medication Question >> Mar 29, 2024 10:11 AM Graeme ORN wrote: Reason for CRM: Patient called. Also left message for provider in MyChart. Wants to know if she should take all the medication that was sent in yesterday or just what was sent after the results. Would like a call back. Thank You

## 2024-04-16 ENCOUNTER — Encounter: Payer: Self-pay | Admitting: Medical-Surgical

## 2024-04-18 ENCOUNTER — Ambulatory Visit

## 2024-04-18 ENCOUNTER — Encounter: Payer: Self-pay | Admitting: Medical-Surgical

## 2024-04-18 ENCOUNTER — Ambulatory Visit (INDEPENDENT_AMBULATORY_CARE_PROVIDER_SITE_OTHER): Admitting: Medical-Surgical

## 2024-04-18 VITALS — BP 106/67 | HR 119 | Temp 99.3°F | Resp 20 | Ht 66.0 in | Wt 96.1 lb

## 2024-04-18 DIAGNOSIS — J180 Bronchopneumonia, unspecified organism: Secondary | ICD-10-CM

## 2024-04-18 MED ORDER — BENZONATATE 100 MG PO CAPS: 100.0000 mg | ORAL_CAPSULE | Freq: Three times a day (TID) | ORAL | 0 refills | Status: DC | PRN

## 2024-04-18 MED ORDER — HYDROCODONE BIT-HOMATROP MBR 5-1.5 MG/5ML PO SOLN
5.0000 mL | Freq: Three times a day (TID) | ORAL | 0 refills | Status: AC | PRN
Start: 2024-04-18 — End: ?

## 2024-04-18 MED ORDER — BENZONATATE 100 MG PO CAPS
100.0000 mg | ORAL_CAPSULE | Freq: Three times a day (TID) | ORAL | 0 refills | Status: AC | PRN
Start: 2024-04-18 — End: 2024-04-25

## 2024-04-18 NOTE — Progress Notes (Signed)
        Established patient visit  Discussed the use of AI scribe software for clinical note transcription with the patient, who gave verbal consent to proceed.  History of Present Illness   Heather [name] is a 63 year old female who presents with persistent cough and gastrointestinal side effects from medication.  Cough and chest pain - Persistent cough transitioned from productive to dry and irritated - Cough associated with chest pain, especially with coughing - Cough disrupts sleep - No shortness of breath or pain on deep breathing - Cough syrup before bed provides some relief  Constitutional symptoms - Feverish sensation with temp of 99.3 today - Elevated heart rate  Gastrointestinal symptoms - Gastrointestinal side effects from Augmentin , including upset stomach, loss of appetite, and loose bowel movements - Weight loss during course of Augmentin  - Gastrointestinal symptoms improved after discontinuing Augmentin   Recent treatments - Completed courses of azithromycin  and Medrol  dose pack for lung infection - Augmentin  discontinued after seven days due to gastrointestinal side effects      Physical Exam Vitals reviewed.  Constitutional:      General: She is not in acute distress.    Appearance: Normal appearance. She is underweight. She is not ill-appearing or diaphoretic.  HENT:     Head: Normocephalic and atraumatic.  Cardiovascular:     Rate and Rhythm: Normal rate and regular rhythm.     Pulses: Normal pulses.     Heart sounds: Normal heart sounds. No murmur heard.    No friction rub. No gallop.  Pulmonary:     Effort: Pulmonary effort is normal. No respiratory distress.     Breath sounds: Normal breath sounds. No wheezing.     Comments: Dry cough, good effort. Skin:    General: Skin is warm and dry.  Neurological:     Mental Status: She is alert and oriented to person, place, and time.  Psychiatric:        Mood and Affect: Mood normal.         Behavior: Behavior normal. Behavior is cooperative.        Thought Content: Thought content normal.        Judgment: Judgment normal.    Assessment and Plan    Bronchopneumonia Symptoms concerning for possible worsening of bronchopneumonia vs post viral cough. Completed azithromycin  and prednisone, discontinued Augmentin  due to GI side effects with 6 doses left out of 20. Concern for pneumonia progression as azithromycin  may be insufficient. - Complete chest x-ray to assess progression. - Start lower dose of Tessalon  perls TID prn. - Refilled Hycodan for nocturnal cough.     Return if symptoms worsen or fail to improve.  __________________________________ Zada FREDRIK Palin, DNP, APRN, FNP-BC Primary Care and Sports Medicine [facility] [place]

## 2024-04-19 ENCOUNTER — Ambulatory Visit: Payer: Self-pay | Admitting: Medical-Surgical

## 2024-04-19 DIAGNOSIS — R9389 Abnormal findings on diagnostic imaging of other specified body structures: Secondary | ICD-10-CM

## 2024-04-19 LAB — CBC WITH DIFFERENTIAL/PLATELET
Basophils Absolute: 0.1 x10E3/uL (ref 0.0–0.2)
Basos: 1 %
EOS (ABSOLUTE): 0.1 x10E3/uL (ref 0.0–0.4)
Eos: 1 %
Hematocrit: 39.3 % (ref 34.0–46.6)
Hemoglobin: 12.4 g/dL (ref 11.1–15.9)
Immature Grans (Abs): 0 x10E3/uL (ref 0.0–0.1)
Immature Granulocytes: 0 %
Lymphocytes Absolute: 1 x10E3/uL (ref 0.7–3.1)
Lymphs: 12 %
MCH: 26.4 pg — ABNORMAL LOW (ref 26.6–33.0)
MCHC: 31.6 g/dL (ref 31.5–35.7)
MCV: 84 fL (ref 79–97)
Monocytes Absolute: 0.7 x10E3/uL (ref 0.1–0.9)
Monocytes: 7 %
Neutrophils Absolute: 7.2 x10E3/uL — ABNORMAL HIGH (ref 1.4–7.0)
Neutrophils: 79 %
Platelets: 333 x10E3/uL (ref 150–450)
RBC: 4.69 x10E6/uL (ref 3.77–5.28)
RDW: 13.4 % (ref 11.7–15.4)
WBC: 9 x10E3/uL (ref 3.4–10.8)

## 2024-04-29 ENCOUNTER — Other Ambulatory Visit: Payer: Self-pay | Admitting: Medical-Surgical

## 2024-04-29 DIAGNOSIS — Z1231 Encounter for screening mammogram for malignant neoplasm of breast: Secondary | ICD-10-CM

## 2024-05-01 ENCOUNTER — Ambulatory Visit (INDEPENDENT_AMBULATORY_CARE_PROVIDER_SITE_OTHER)

## 2024-05-01 ENCOUNTER — Telehealth: Payer: Self-pay

## 2024-05-01 ENCOUNTER — Ambulatory Visit: Payer: Self-pay | Admitting: Medical-Surgical

## 2024-05-01 DIAGNOSIS — R9389 Abnormal findings on diagnostic imaging of other specified body structures: Secondary | ICD-10-CM

## 2024-05-01 MED ORDER — IOHEXOL 300 MG/ML  SOLN
100.0000 mL | Freq: Once | INTRAMUSCULAR | Status: AC | PRN
  Administered 2024-05-01: 75 mL via INTRAVENOUS

## 2024-05-01 NOTE — Telephone Encounter (Signed)
 [facility] radiology calling with critical results forwared to joy and and Dr. alvan

## 2024-05-02 ENCOUNTER — Inpatient Hospital Stay: Attending: Hematology and Oncology | Admitting: Hematology and Oncology

## 2024-05-02 ENCOUNTER — Telehealth: Payer: Self-pay | Admitting: Pulmonary Disease

## 2024-05-02 DIAGNOSIS — R918 Other nonspecific abnormal finding of lung field: Secondary | ICD-10-CM | POA: Diagnosis not present

## 2024-05-02 DIAGNOSIS — Z170 Estrogen receptor positive status [ER+]: Secondary | ICD-10-CM | POA: Diagnosis not present

## 2024-05-02 DIAGNOSIS — R590 Localized enlarged lymph nodes: Secondary | ICD-10-CM

## 2024-05-02 DIAGNOSIS — C50412 Malignant neoplasm of upper-outer quadrant of left female breast: Secondary | ICD-10-CM

## 2024-05-02 MED ORDER — HYDROCOD POLI-CHLORPHE POLI ER 10-8 MG/5ML PO SUER: 5.0000 mL | Freq: Two times a day (BID) | ORAL | 0 refills | Status: DC | PRN

## 2024-05-02 NOTE — Assessment & Plan Note (Signed)
 11/13/2018: Left mastectomy: Grade 2 IDC, 1.6 cm, with intermediate grade DCIS, margins negative, lymphovascular invasion present, 1/7 lymph nodes positive, MammaPrint high risk, ER 100%, PR 30%, HER-2 negative, Ki-67 30% T1CN1A stage Ia   Treatment plan: 1.  Adjuvant chemotherapy with dose since he remains on Cytoxan  x4 followed by Taxol  weekly x10 completed 04/22/2019  2.  Followed by adjuvant radiation therapy 06/06/2019-07/19/2019 3.  Followed by adjuvant antiestrogen therapy with anastrozole  since 2020 ---------------------------------------------------------------------------- Intractable cough since July 2025: CT chest: 05/01/2024: Right hilar mass 2.7 cm, enlarged mediastinal lymph nodes, consolidations arising from the hilum left upper lobe, right upper lobe and right middle lobe groundglass interstitial thickening, irregular pulmonary nodules throughout left lung, bone lesions T3-T4 and T5  Recommendation: Consult pulmonary for bronchoscopy and biopsy PET CT scan  Once we have biopsy results discuss treatment plan.

## 2024-05-02 NOTE — Telephone Encounter (Signed)
 Referral from oncology for mass, pleural effusion and lymphadenopathy. Please get appointment ASAP with next available provider within 1 -2 weeks - Either Drs. Byrum,Hattar, Alghanim

## 2024-05-02 NOTE — Progress Notes (Signed)
 HEMATOLOGY-ONCOLOGY TELEPHONE VISIT PROGRESS NOTE  I connected with our patient on 05/02/24 at  1:30 PM EDT by telephone and verified that I am speaking with the correct person using two identifiers.  I discussed the limitations, risks, security and privacy concerns of performing an evaluation and management service by telephone and the availability of in person appointments.  I also discussed with the patient that there may be a patient responsible charge related to this service. The patient expressed understanding and agreed to proceed.   History of Present Illness: Telephone follow up on recent findings of metastatic disease  History of Present Illness Ms Heather [name] has been having resp symptoms for about a month and was treated for infection but when her symptoms did not respond, she underwent chest xrays and subsequently a CT scan which revealed hilar and mediastinal LN and sclerotic lesions in spine.     Oncology History  Malignant neoplasm of upper-outer quadrant of left breast in female, estrogen receptor positive (HCC)  09/12/2018 Initial Diagnosis   Palpable left breast mass at 2:30 position by ultrasound measured 1.6 cm, calcifications spanning 5.9 cm, 3 abnormal lymph nodes, multiple masses and calcifications biopsy of the mass revealed grade 2 IDC plus DCIS with lymphovascular invasion, ER 100%, PR 60%, Ki-67 30%, HER-2 negative IHC 1+, pathology of calcifications is pending, biopsy of lymph nodes is discordant benign, T1cN0 stage Ia   09/19/2018 Cancer Staging   Staging form: Breast, AJCC 8th Edition - Clinical stage from 09/19/2018: Stage IA (cT1c(m), cN0, cM0, G2, ER+, PR+, HER2-) - Signed by Odean Potts, MD on 09/19/2018   09/2018 - 09/2025 Anti-estrogen oral therapy   Letrozole  2.5 mg and discontinued in 12/2018. Anastrozole  1 mg started 07/2019.    11/13/2018 Surgery   Left mastectomy [facility]) [phone]): Grade 2 IDC, 1.6 cm, with intermediate grade DCIS, margins  negative, lymphovascular invasion present, 1/7 lymph nodes positive, MammaPrint high risk, ER 100%, PR 30%, HER-2 negative, Ki-67 30% T1CN1A stage Ia   11/13/2018 Cancer Staging   Staging form: Breast, AJCC 8th Edition - Pathologic stage from 11/13/2018: Stage IA (pT1c, pN1a, cM0, G2, ER+, PR+, HER2-) - Signed by Crawford Morna Pickle, NP on 11/28/2018   11/13/2018 Oncotype testing   MammaPrint results reveal a high risk luminal type-B with a 94.6% predicted benefit of treatment at 5 years with both chemotherapy and hormonal therapy.     12/17/2018 - 04/22/2019 Chemotherapy   Adjuvant chemo with AC Taxol    06/06/2019 - 07/23/2019 Radiation Therapy   The patient initially received a dose of 50.4 Gy in 28 fractions to the breast and 50.4 Gy in 28 fractions to the left supraclavicular region using whole-breast tangent fields. This was delivered using a 3-D conformal technique. The pt received a boost delivering an additional 10 Gy in 5 fractions using a electron boost with electrons. The total dose was 110.8 Gy.    08/2019 -  Anti-estrogen oral therapy   Anastrozole      REVIEW OF SYSTEMS:   Constitutional: Denies fevers, chills or abnormal weight loss All other systems were reviewed with the patient and are negative. Observations/Objective:     Assessment Plan:  Malignant neoplasm of upper-outer quadrant of left breast in female, estrogen receptor positive (HCC) 11/13/2018: Left mastectomy: Grade 2 IDC, 1.6 cm, with intermediate grade DCIS, margins negative, lymphovascular invasion present, 1/7 lymph nodes positive, MammaPrint high risk, ER 100%, PR 30%, HER-2 negative, Ki-67 30% T1CN1A stage Ia   Treatment plan: 1.  Adjuvant chemotherapy with  dose since he remains on Cytoxan  x4 followed by Taxol  weekly x10 completed 04/22/2019  2.  Followed by adjuvant radiation therapy 06/06/2019-07/19/2019 3.  Followed by adjuvant antiestrogen therapy with anastrozole  since  2020 ---------------------------------------------------------------------------- Intractable cough since July 2025: CT chest: 05/01/2024: Right hilar mass 2.7 cm, enlarged mediastinal lymph nodes, consolidations arising from the hilum left upper lobe, right upper lobe and right middle lobe groundglass interstitial thickening, irregular pulmonary nodules throughout left lung, bone lesions T3-T4 and T5  Recommendation: Consult pulmonary for bronchoscopy and biopsy PET CT scan  Once we have biopsy results discuss treatment plan.  I discussed the assessment and treatment plan with the patient. The patient was provided an opportunity to ask questions and all were answered. The patient agreed with the plan and demonstrated an understanding of the instructions. The patient was advised to call back or seek an in-person evaluation if the symptoms worsen or if the condition fails to improve as anticipated.   I provided 20 minutes of non-face-to-face time during this encounter.  This includes time for charting and coordination of care   Naomi MARLA Chad, MD

## 2024-05-02 NOTE — Telephone Encounter (Signed)
 Pt has been scheduled.

## 2024-05-04 ENCOUNTER — Other Ambulatory Visit: Payer: Self-pay | Admitting: Medical-Surgical

## 2024-05-04 DIAGNOSIS — I10 Essential (primary) hypertension: Secondary | ICD-10-CM

## 2024-05-09 ENCOUNTER — Ambulatory Visit (HOSPITAL_BASED_OUTPATIENT_CLINIC_OR_DEPARTMENT_OTHER)

## 2024-05-09 ENCOUNTER — Encounter: Payer: Self-pay | Admitting: Pulmonary Disease

## 2024-05-09 ENCOUNTER — Ambulatory Visit (HOSPITAL_BASED_OUTPATIENT_CLINIC_OR_DEPARTMENT_OTHER): Admitting: Pulmonary Disease

## 2024-05-09 ENCOUNTER — Telehealth: Payer: Self-pay | Admitting: Pulmonary Disease

## 2024-05-09 ENCOUNTER — Encounter (HOSPITAL_BASED_OUTPATIENT_CLINIC_OR_DEPARTMENT_OTHER): Payer: Self-pay | Admitting: Pulmonary Disease

## 2024-05-09 ENCOUNTER — Other Ambulatory Visit: Payer: Self-pay | Admitting: Pulmonary Disease

## 2024-05-09 VITALS — BP 120/78 | HR 123 | Ht 66.0 in | Wt 91.0 lb

## 2024-05-09 DIAGNOSIS — J90 Pleural effusion, not elsewhere classified: Secondary | ICD-10-CM

## 2024-05-09 DIAGNOSIS — R590 Localized enlarged lymph nodes: Secondary | ICD-10-CM

## 2024-05-09 DIAGNOSIS — R918 Other nonspecific abnormal finding of lung field: Secondary | ICD-10-CM

## 2024-05-09 DIAGNOSIS — Z853 Personal history of malignant neoplasm of breast: Secondary | ICD-10-CM | POA: Diagnosis not present

## 2024-05-09 DIAGNOSIS — Z87891 Personal history of nicotine dependence: Secondary | ICD-10-CM

## 2024-05-09 LAB — BODY FLUID CELL COUNT WITH DIFFERENTIAL
Eos, Fluid: 4 %
Lymphs, Fluid: 72 %
Monocyte-Macrophage-Serous Fluid: 9 % — ABNORMAL LOW (ref 50–90)
Neutrophil Count, Fluid: 15 % (ref 0–25)
Total Nucleated Cell Count, Fluid: 1904 uL — ABNORMAL HIGH (ref 0–1000)

## 2024-05-09 LAB — LACTATE DEHYDROGENASE, PLEURAL OR PERITONEAL FLUID: LD, Fluid: 413 U/L — ABNORMAL HIGH (ref 3–23)

## 2024-05-09 NOTE — Patient Instructions (Addendum)
 We have done your thoracentesis, drainage of pleural fluid, today. I expect to get some results from that by next week.   I will schedule you for an EBUS in two weeks. If the fluid yields result, we will cancel it.  Can use the cough syrup in the meantime to address your cough.

## 2024-05-09 NOTE — Telephone Encounter (Signed)
 Please schedule the following:  Provider performing procedure: Paula Southerly, MD Diagnosis: RML mass and mediastinal adenopathy Which side if for nodule / mass? Bilateral Procedure: EBUS Has patient been spoken to by Provider and given informed consent? Yes Anesthesia: General Do you need Fluro? No Duration of procedure: 1.5 hours Date: 05/23/2024 Alternate Date: 05/16/2024 Time: AM if 8/21/ PM if 8/14 Location: [facility] Does patient have OSA? No DM? No Or Latex allergy? No Medication Restriction/ Anticoagulate/Antiplatelet: No Pre-op Labs Ordered:determined by Anesthesia Imaging request: PET/CT already scheduled for 8/14

## 2024-05-09 NOTE — Progress Notes (Signed)
 New Patient Pulmonology Office Visit   Subjective:  Patient ID: Heather [name], female    DOB: 1961-06-24  MRN: [id number]  Referred by: [name], NP  CC:  Chief Complaint  Patient presents with   Establish Care    HPI [name] is a 63 y.o. female with PMH significant for HTN, HLD, L breast cancer s/p mastectomy who presents for initial consultation in the setting of Lung mass.  Over a month ago started with lots of coughing. PCP ordered a CXR. She was given antitiotics and bronchitis medication but did not work. She eventually ordered CT chest that showed lung mass with mediastinal and hilar adneopathy. She referred to pulm. Sometimes shortness of breath. Lost 20 lbs in a month. Low appetite. Fatigue. No fevers, chills, or nights. Syrup helped a little bit. No hemoptysis.  Oncologic Background: 11/13/2018: Left mastectomy: Grade 2 IDC, 1.6 cm, with intermediate grade DCIS, margins negative, lymphovascular invasion present, 1/7 lymph nodes positive, MammaPrint high risk, ER 100%, PR 30%, HER-2 negative, Ki-67 30% T1CN1A stage Ia Treatment history: 1.  Adjuvant chemotherapy with dose since he remains on Cytoxan  x4 followed by Taxol  weekly x10 completed 04/22/2019  2.  Followed by adjuvant radiation therapy 06/06/2019-07/19/2019 3.  Followed by adjuvant antiestrogen therapy with anastrozole  since 2020  Symptoms associated with Lung cancer:   - Sx of Central Tumors:  [X]  Dyspnea  [X]  Cough  [ ]  Hemoptysis  [ ]  Wheezing  [ ]  Post-obstructive pneumonia   - Sx of Peripheral Tumors:  [ ]  Cough  [ ]  Dyspnea  [ ]  Pain   - Sx of locoregional spread:  [ ]  Superior vena cava obstruction - facial flushing/redness, upper extremity edema, chest pain, cyanosis  [ ]  Sx of recurrent laryngeal nerve palsy - hoarseness  [ ]  Sx of phrenic nerve palsy - elevated hemidiaphragm and dyspnea  [ ]  Brachial nerve root compression - Horner's syndrome miosis, ptosis, anhidrosis  [  ] Esophageal compression - dysphagia, nausea, vomiting Constitutional symptoms:  [ ]  Weight loss  [ ]  Changes in appetite  [ ]  B symptoms - fevers, chills, or night sweats Sx of metastasis:  [ ]  CNS - headaches, AMS, neck stiffness, seizures, nausea/vomiting, poor balance  [ ]  MSK - bone pain, limb weakness or sensory loss   Conditions associated with Lung cancer & important to identify prior to bronch:  [ ]  Hx of COPD  [ ]  Hx of CVD  [ ]  Hx of OSA   Hx of Anesthesia reactions: Breast surgery in 2020, no problems with anethesia [ ]  Prior surgeries and anesthesia reactions  [ ]  Hx of difficult intubation  [ ]  Family Hx of malignant hyperthermia   PMH:  - Breast cancer s/p mastectomy  Medications: reviewed in chart, no blood thinners  Allergies: none  SH:  [X]  Tobacco Smoking - 15 - 20 years x 1/2 pack = 10 PY [ ]  Second hand smoking  [X]  Indoor emission from household combustion  [ ]  Outdoor air pollution   Occupational exposures: retired, 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 [ ]  Asbestos - Holiday representative workers, Social research officer, government, Therapist, sports, railroad, Orthoptist radiation - uranium mining  [ ]  Vinyl chloride - pulp & paper workers  [ ]  Arsenic - smelting of ores, pesticide application, Technical sales engineer  [ ]  Beryllium - Archivist workers, Geophysical data processor, Pensions consultant workers, jewelers  [ ]  chromium - Financial trader, welding, tanning industries  [ ]  Nickel -  battery makers, Naval architect, electroplaters, glass wokers, Health and safety inspector, metal workers & welders, Physiological scientist specific exposures:  [ ]  Agent orange - [place] War  [ ]  Depleted Uranium - Gulf War  [ ]  Asbestos - Cabin crew due to shipyards  [ ]  Mustard gas - WWI   FH:  [ ]  Relatives with hx of lung cancer  [ ]  Relatives with other types of cancer    ASA grade:  [ ]  ASA I - normal healthy patient  [ ]  ASA II - a patient with mild systemic disease  [X]  ASA III - a patient with severe  systemic disease  [ ]  ASA IV - a patient with severe systemic disease that is a constant threat to Life   Karnofsky Performance Status: [ ]  100 Normal; no complaints;  [ ]  90 Able to carry on normal activity, minor signs,Tor symptoms of disease  [X]  80 Normal activity with effort; some sign or symptoms of disease  [ ]  70 Cares for self; unable to carry on normal activity or do active work  [ ]  60 Requires occasional assistance, but is able to care for most personal needs  [ ]  50 Requires considerable assistance and frequent medical care  [ ]  40 Disabled, requires special care and assistance  [ ]  30 Severely disabled; hospitalization is indicated, although death not imminent  [ ]  20 Very sick; hospitalization necessary; requires active support treatment  [ ]  10 Moribund; fatal processes progressing rapidly  [ ]  0 Dead   ECOG PERFORMANCE STATUS: [ ]  0 Fully Active, able to carry on all pre-disease performance without restriction  [X]  1 Restricted in physical strenuous activity but ambulatory and able to carry out work of a light or sedentary nature e.g. light house work office work  [ ]  2 Ambulatory and capable of all self-care but unable to carry our work activities. Up and about more than 50% of walking hours  [ ]  3 Capable of only limited self-care, confined to bed or chair more than 50% of waking hours  [ ]  4 Completely disable, Cannot carry on any self-care. Totally confined to bed or chair  [ ]  5 Dead  ROS  Allergies: Patient has no known allergies.  Current Outpatient Medications:    anastrozole  (ARIMIDEX ) 1 MG tablet, Take 1 tablet (1 mg total) by mouth daily., Disp: 90 tablet, Rfl: 3   atorvastatin  (LIPITOR) 20 MG tablet, TAKE 1 TABLET BY MOUTH EVERY DAY, Disp: 90 tablet, Rfl: 3   chlorpheniramine-HYDROcodone  (TUSSIONEX) 10-8 MG/5ML, Take 5 mLs by mouth every 12 (twelve) hours as needed for cough., Disp: 115 mL, Rfl: 0   HYDROcodone  bit-homatropine (HYCODAN) 5-1.5 MG/5ML syrup,  Take 5 mLs by mouth every 8 (eight) hours as needed for cough., Disp: 120 mL, Rfl: 0   lisinopril -hydrochlorothiazide  (ZESTORETIC ) 10-12.5 MG tablet, TAKE 1 TABLET BY MOUTH EVERY DAY, Disp: 90 tablet, Rfl: 3 No current facility-administered medications for this visit.  Facility-Administered Medications Ordered in Other Visits:    sodium chloride  flush (NS) 0.9 % injection 10 mL, 10 mL, Intravenous, PRN, Gudena, Vinay, MD, 10 mL at 12/17/18 0907 Past Medical History:  Diagnosis Date   Breast cancer [facility])    Breast cancer, left (HCC) 11/13/2018   Cancer (HCC)    Breast Cancer 09/2018   Family history of breast cancer    History of therapeutic radiation 03/02/2021   Hypertension    Malignant neoplasm of upper-outer quadrant of left breast in female, estrogen receptor positive (  HCC) 09/17/2018   Following w/ Dr Ebbie at [facility]    Port-A-Cath in place 01/14/2019   Past Surgical History:  Procedure Laterality Date   BREAST RECONSTRUCTION WITH PLACEMENT OF TISSUE EXPANDER AND ALLODERM Left 11/13/2018   Procedure: LEFT BREAST RECONSTRUCTION WITH PLACEMENT OF TISSUE EXPANDER AND ALLODERM;  Surgeon: Arelia Filippo, MD;  Location: [facility] OR;  Service: Plastics;  Laterality: Left;   CESAREAN SECTION     PORT-A-CATH REMOVAL Right 08/26/2019   Procedure: REMOVAL PORT-A-CATH;  Surgeon: Ebbie Cough, MD;  Location: [facility];  Service: General;  Laterality: Right;   PORTACATH PLACEMENT Right 12/11/2018   Procedure: INSERTION PORT-A-CATH WITH ULTRASOUND;  Surgeon: Ebbie Cough, MD;  Location: [facility];  Service: General;  Laterality: Right;   REMOVAL OF TISSUE EXPANDER AND PLACEMENT OF IMPLANT Left 02/23/2021   Procedure: REMOVAL OF TISSUE EXPANDER AND PLACEMENT OF SILICONE IMPLANT;  Surgeon: Arelia Filippo, MD;  Location: [facility];  Service: Plastics;  Laterality: Left;   TUBAL LIGATION     Family History  Problem  Relation Age of Onset   Hypertension Mother    Diabetes Mother    Hypertension Sister    Breast cancer Maternal Aunt 91   Breast cancer Cousin 60       mat first cousin   Heart attack Father 62   Social History   Socioeconomic History   Marital status: Married    Spouse name: Not on file   Number of children: Not on file   Years of education: Not on file   Highest education level: Master's degree (e.g., MA, MS, MEng, MEd, MSW, MBA)  Occupational History   Not on file  Tobacco Use   Smoking status: Former    Current packs/day: 0.00    Types: Cigarettes    Quit date: 08/03/2018    Years since quitting: 5.7   Smokeless tobacco: Never   Tobacco comments:    off and on for about 10-15 years.   Vaping Use   Vaping status: Never Used  Substance and Sexual Activity   Alcohol use: Yes    Comment: rarely   Drug use: No   Sexual activity: Not on file  Other Topics Concern   Not on file  Social History Narrative   Not on file   Social Drivers of Health   Financial Resource Strain: Low Risk  (01/24/2023)   Overall Financial Resource Strain (CARDIA)    Difficulty of Paying Living Expenses: Not hard at all  Food Insecurity: No Food Insecurity (01/24/2023)   Hunger Vital Sign    Worried About Running Out of Food in the Last Year: Never true    Ran Out of Food in the Last Year: Never true  Transportation Needs: No Transportation Needs (01/24/2023)   PRAPARE - Administrator, Civil Service (Medical): No    Lack of Transportation (Non-Medical): No  Physical Activity: Insufficiently Active (01/24/2023)   Exercise Vital Sign    Days of Exercise per Week: 2 days    Minutes of Exercise per Session: 20 min  Stress: No Stress Concern Present (01/24/2023)   [employer] of Occupational Health - Occupational Stress Questionnaire    Feeling of Stress : Only a little  Social Connections: Socially Integrated (01/24/2023)   Social Connection and Isolation Panel    Frequency of  Communication with Friends and Family: More than three times a week    Frequency of Social Gatherings with Friends and  Family: Once a week    Attends Religious Services: More than 4 times per year    Active Member of Clubs or Organizations: Yes    Attends Banker Meetings: 1 to 4 times per year    Marital Status: Married  Catering manager Violence: Not At Risk (05/14/2019)   Humiliation, Afraid, Rape, and Kick questionnaire    Fear of Current or Ex-Partner: No    Emotionally Abused: No    Physically Abused: No    Sexually Abused: No       Objective:  BP 120/78 (BP Location: Left Arm, Patient Position: Sitting)   Pulse (!) 123   Ht 5' 6 (1.676 m)   Wt (S) 91 lb (41.3 kg)   SpO2 97%   BMI 14.69 kg/m  SpO2 Readings from Last 3 Encounters:  05/09/24 97%  04/18/24 98%  03/28/24 100%    Physical Exam General: chronically ill appearing, thin, cachectic Eyes: PERRL, no scleral icterus ENMT: oropharynx clear, good dentition, no oral lesions, mallampati score I Skin: warm, intact, no rashes Neck: JVD flat CV: RRR, no MRG, nl S1 and S2, no peripheral edema Resp: expiratory wheezing, POCUS ultrasound shows a small anechoic pleural effusion involving the right lung Abdom: Normoactive bowel sounds, soft, nontender, nondistended, no hepatosplenomegaly Neuro: Awake alert oriented to person place time and situation  Diagnostic Review:  Last CBC Lab Results  Component Value Date   WBC 9.0 04/18/2024   HGB 12.4 04/18/2024   HCT 39.3 04/18/2024   MCV 84 04/18/2024   MCH 26.4 (L) 04/18/2024   RDW 13.4 04/18/2024   PLT 333 04/18/2024   Last metabolic panel Lab Results  Component Value Date   GLUCOSE 87 09/07/2023   NA 139 09/07/2023   K 4.3 09/07/2023   CL 100 09/07/2023   CO2 26 09/07/2023   BUN 15 09/07/2023   CREATININE 0.78 09/07/2023   EGFR 85 09/07/2023   CALCIUM  10.2 09/07/2023   PROT 6.2 09/07/2023   ALBUMIN  4.5 09/07/2023   LABGLOB 1.7 09/07/2023    BILITOT 0.4 09/07/2023   ALKPHOS 94 09/07/2023   AST 31 09/07/2023   ALT 16 09/07/2023   ANIONGAP 6 02/19/2021    I reviewed her imaging independently and patient's CT chest 04/2024 shows significantly enlarged paratracheal, subcarinal, and R hilar adenopathy with mass like lesion in the RML concerning for primary lung neoplasm as well as right sided pleural effusion.    Assessment & Plan:   Assessment & Plan Pleural effusion The patient has right sided pleural effusion. I suspect this is a malignant effusion. I performed thoracentesis after consent was obtained from patient and periprocedural time out was performed. 350 cc of serosanguinous fluid was drained. Pleural fluid cell count, chemistries, microbiology, and cytology was ordered. If cytology yields a malignant etiology, there will be no need for linear EBUS to evaluate mediastinal adenopathy.If negative, then we will proceed with linear EBUS for tissue diagnosis. The patient is in agreement with the plan. Lung mass Patient has R hilar lung mass and mediastinal + hilar adenopathy. The patient will need linear EBUS for pathologic evaluation if pleural effusion studies did not yield a malignant etiology. Ddx: recurrent breast cancer versus primary lung neoplasm. Her medical oncologist already ordered a PET/CT which is scheduled on 8/14.  Mediastinal adenopathy Plan as above Hx of breast cancer Continue Anastrazole  Orders Placed This Encounter  Procedures   Procedural/ Surgical Case Request: ENDOBRONCHIAL ULTRASOUND (EBUS)   AFB culture with smear  Fungus Culture with Smear   Body fluid culture   DG Chest 2 View   Protein, body fluid (other)   Lactate dehydrogenase, fluid - peritoneal or pleural   Body fluid cell count with differential   Pathologist smear review   I spent 70 minutes reviewing patient's chart including prior consultant notes, imaging, and PFTs as well as face-to-face with the patient, over half in discussion of  the diagnosis and the importance of compliance with the treatment plan.  Return in about 1 month (around 06/09/2024).   Aminata Buffalo, MD

## 2024-05-09 NOTE — Progress Notes (Signed)
 Thoracentesis  Procedure Note  Heather [name]  [id number]  Sep 02, 1961  Date:05/09/24  Time:1:58 PM   Provider Performing:Nikolaj Geraghty CATHERINE   Procedure: Thoracentesis with imaging guidance ([id number])  Indication(s) Pleural Effusion  Consent Risks of the procedure as well as the alternatives and risks of each were explained to the patient and/or caregiver.  Consent for the procedure was obtained and is signed in the bedside chart  Anesthesia Topical only with 1% lidocaine     Time Out Verified patient identification, verified procedure, site/side was marked, verified correct patient position, special equipment/implants available, medications/allergies/relevant history reviewed, required imaging and test results available.   Sterile Technique Maximal sterile technique including full sterile barrier drape, hand hygiene, sterile gown, sterile gloves, mask, hair covering, sterile ultrasound probe cover (if used).  Procedure Description Ultrasound was used to identify appropriate pleural anatomy for placement and overlying skin marked.  Area of drainage cleaned and draped in sterile fashion. Lidocaine  was used to anesthetize the skin and subcutaneous tissue.  350 cc's of serosanguinous appearing fluid was drained from the right pleural space. Catheter then removed and bandaid applied to site.   Complications/Tolerance None; patient tolerated the procedure well. Chest X-ray is ordered to confirm no post-procedural complication.   EBL Minimal   Specimen(s) Pleural fluid                      PAULA CATHERINE, MD

## 2024-05-10 ENCOUNTER — Other Ambulatory Visit (HOSPITAL_COMMUNITY)
Admission: RE | Admit: 2024-05-10 | Discharge: 2024-05-10 | Disposition: A | Discharge status: Home / Self Care | Source: Ambulatory Visit | Attending: Pulmonary Disease | Admitting: Pulmonary Disease

## 2024-05-10 ENCOUNTER — Ambulatory Visit (HOSPITAL_BASED_OUTPATIENT_CLINIC_OR_DEPARTMENT_OTHER): Payer: Self-pay | Admitting: Pulmonary Disease

## 2024-05-10 DIAGNOSIS — J90 Pleural effusion, not elsewhere classified: Secondary | ICD-10-CM | POA: Insufficient documentation

## 2024-05-10 LAB — ACID FAST SMEAR (AFB, MYCOBACTERIA): Acid Fast Smear: NEGATIVE

## 2024-05-10 LAB — PATHOLOGIST SMEAR REVIEW

## 2024-05-10 NOTE — Addendum Note (Signed)
 Addended by: Lety Cullens on: 05/10/2024 08:47 AM   Modules accepted: Orders

## 2024-05-10 NOTE — Assessment & Plan Note (Signed)
 Patient has R hilar lung mass and mediastinal + hilar adenopathy. The patient will need linear EBUS for pathologic evaluation if pleural effusion studies did not yield a malignant etiology. Ddx: recurrent breast cancer versus primary lung neoplasm. Her medical oncologist already ordered a PET/CT which is scheduled on 8/14.

## 2024-05-10 NOTE — Assessment & Plan Note (Signed)
 Plan as above.

## 2024-05-10 NOTE — Telephone Encounter (Signed)
 I called the patient and informed her of result and that Dr. Joyceann office would reach out to her next week for an appointment given new diagnosis of stage IV lung cancer. I discussed this with Dr. Coit. She does not need EBUS since we already made the diagnosis.

## 2024-05-10 NOTE — Telephone Encounter (Signed)
 This was scheduled by [place].  Forwarding to [employer] to authorize.

## 2024-05-12 LAB — SPECIMEN STATUS REPORT

## 2024-05-12 LAB — PROTEIN, BODY FLUID (OTHER): Protein, Fluid: 3.6 g/dL

## 2024-05-12 LAB — BODY FLUID CULTURE W GRAM STAIN
Culture: NO GROWTH
Gram Stain: NONE SEEN

## 2024-05-13 ENCOUNTER — Telehealth: Payer: Self-pay | Admitting: Pulmonary Disease

## 2024-05-13 ENCOUNTER — Telehealth: Payer: Self-pay | Admitting: Hematology and Oncology

## 2024-05-13 ENCOUNTER — Telehealth: Payer: Self-pay | Admitting: Internal Medicine

## 2024-05-13 LAB — CYTOLOGY - NON PAP

## 2024-05-13 NOTE — Telephone Encounter (Signed)
 Copied from CRM [id number]. Topic: General - Other >> May 13, 2024 10:32 AM Heather [name] wrote: Reason for CRM: Patient was in on August 7 and Dr A told her he was referring her to someone else.  She has not heard anything from anyone and would like to know who she is to set up an appt to see. Please give patient a call @ [phone]

## 2024-05-13 NOTE — Telephone Encounter (Signed)
 I called the Heather [name] and inform her that the pleural fluid cytology did come back as non-small cell lung cancer TTF-1 positive. We have requested an appointment for the patient to see Dr. Sherrod. She has an appointment this Thursday for a PET CT scan.

## 2024-05-13 NOTE — Telephone Encounter (Signed)
 Scheduled appointments per staff message. Talked with the patient and she is aware of the made appointments.

## 2024-05-15 NOTE — Telephone Encounter (Signed)
 I called and spoke to pt. Pt was concerned about who she was suppose to see next next. I informed her that she was scheduled to see Dr Catherine on 05-27-24 who is the same doctor she saw last time. Pt states she is having a PET scan done tomorrow and she will inform the office if she she still wants to keep the upcoming appointment. NFN

## 2024-05-16 ENCOUNTER — Ambulatory Visit (HOSPITAL_COMMUNITY)
Admission: RE | Admit: 2024-05-16 | Discharge: 2024-05-16 | Disposition: A | Discharge status: Home / Self Care | Source: Ambulatory Visit | Attending: Hematology and Oncology | Admitting: Hematology and Oncology

## 2024-05-16 DIAGNOSIS — Z170 Estrogen receptor positive status [ER+]: Secondary | ICD-10-CM | POA: Insufficient documentation

## 2024-05-16 DIAGNOSIS — R590 Localized enlarged lymph nodes: Secondary | ICD-10-CM | POA: Insufficient documentation

## 2024-05-16 DIAGNOSIS — R918 Other nonspecific abnormal finding of lung field: Secondary | ICD-10-CM | POA: Insufficient documentation

## 2024-05-16 DIAGNOSIS — C50412 Malignant neoplasm of upper-outer quadrant of left female breast: Secondary | ICD-10-CM | POA: Diagnosis present

## 2024-05-16 LAB — GLUCOSE, CAPILLARY: Glucose-Capillary: 126 mg/dL — ABNORMAL HIGH (ref 70–99)

## 2024-05-16 MED ORDER — FLUDEOXYGLUCOSE F - 18 (FDG) INJECTION
4.5500 | Freq: Once | INTRAVENOUS | Status: AC
  Administered 2024-05-16: 4.99 via INTRAVENOUS

## 2024-05-20 ENCOUNTER — Telehealth: Payer: Self-pay

## 2024-05-20 NOTE — Telephone Encounter (Signed)
 Spoke with Rosaline, nurse navigator from [facility], regarding the patient. Rosaline reported that the patient was admitted to the hospital for pneumonia, has a T4 fracture, was evaluated by orthopedics, and now has a back brace. The patient is expected to be discharged today.  Rosaline stated that Dr. Isabell recommends the patient keep the upcoming appointment with Dr. Sherrod on 05/22/24 and proceed with a referral to radiation oncology.  Informed Rosaline that this information will be relayed to Dr. Sherrod. Thanked her for the update.

## 2024-05-21 ENCOUNTER — Telehealth: Payer: Self-pay | Admitting: Medical Oncology

## 2024-05-21 ENCOUNTER — Encounter (HOSPITAL_COMMUNITY): Payer: Self-pay

## 2024-05-21 NOTE — Telephone Encounter (Signed)
 Rosaline RN , Nurse navigator,  at [facility]  said pt is transferring her care to [facility] and has an appt with Dr Chyrl Jerry 08/26 and she will be referred to XRT at [facility]. Pt appts cancelled.

## 2024-05-22 ENCOUNTER — Inpatient Hospital Stay

## 2024-05-22 ENCOUNTER — Inpatient Hospital Stay: Admitting: Internal Medicine

## 2024-05-23 ENCOUNTER — Ambulatory Visit (HOSPITAL_COMMUNITY): Admission: RE | Admit: 2024-05-23 | Source: Home / Self Care | Admitting: Pulmonary Disease

## 2024-05-23 ENCOUNTER — Encounter (HOSPITAL_COMMUNITY): Admission: RE | Payer: Self-pay | Source: Home / Self Care

## 2024-05-23 DIAGNOSIS — R918 Other nonspecific abnormal finding of lung field: Secondary | ICD-10-CM | POA: Insufficient documentation

## 2024-05-23 DIAGNOSIS — R590 Localized enlarged lymph nodes: Secondary | ICD-10-CM | POA: Insufficient documentation

## 2024-05-23 SURGERY — ENDOBRONCHIAL ULTRASOUND (EBUS)
Anesthesia: General | Laterality: Bilateral

## 2024-05-27 ENCOUNTER — Encounter (HOSPITAL_COMMUNITY): Payer: Self-pay | Admitting: Internal Medicine

## 2024-05-27 ENCOUNTER — Ambulatory Visit (HOSPITAL_BASED_OUTPATIENT_CLINIC_OR_DEPARTMENT_OTHER): Admitting: Pulmonary Disease

## 2024-05-28 NOTE — Progress Notes (Addendum)
 Oncology New Patient Consultation - [facility]  Patient: Heather [name] Date: 05/28/2024 MRN: [id number]   History of Present Illness:  Heather [name] is a 63 y.o. 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 She initially presented with 20lb weight loss over last month and failure to thrive. A CT chest was obtained which showed a right lung and left lung masses with mediastinal and hilar adenopathy as well as a right pleural effusion. She had a thoracentesis on 05/09/24 with right pleural fluid consistent cytology consistent with metastatic NSCLC, adenocarcinoma of lung primary positive for TTF-1, Napsin A, cytokeratin 7 and negative for p40 and cytokeratin 5/6. A CT cervical, thoracic and lumbar spine was obtained and revealed T4 osseous disease and a pathologic T4 fracture. An MR spine was obtained revealing gross disease involving T2-T5. Most notable was at T4 with a pathologic fracture and severe heigh loss with associated soft tissue lesion that indents the ventral cord and contributes to mild to moderate canal stenosis and moderate severe T4-T5 foraminal stenosis. She is established with radiation oncology department and is currently undergoing radiotherapy to the thoracic spine. Foundation one NGS testing showed BRAF V600E mutation from 05/24/24.   She is unaccompanied today. She denies fevers, chills, persistent dizziness, headaches, acute vision changes, confusion lymphadenopathy, chest pain, abdominal pain, bowel/bladder function changes, or lower extremity edema. She endorses poor appetite, dysgeusia, cough without hemoptysis, some mild exertional dyspnea and about 20lbs unintentional weight loss in the last 2 months. She also endorses some mid back pain. She denies any history of autoimmune disease. She lives in [place] with  husband and daughter. 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 She remains active but is somewhat limited in her activity compared to 2 months ago.  History of intermittent smoking 20 years, never ppd, maybe pack per 3 days, quit in 2019.    Oncology History Overview Note  Diagnosis: Stage IIA pT1pN1acM0 IDC of Left Breast, G2 ER/PR (+) and HER2 (-) (2020) Stage IVB cT4N3M1c  NSCLC of Right Lung  Medical Oncologist: Dr. Lysbeth  Radiation Oncologist: Dr. Winnifred    Malignant neoplasm of upper-outer quadrant of left breast in female, estrogen receptor positive (HCC)  09/17/2018 Initial Diagnosis   Malignant neoplasm of upper-outer quadrant of left breast in female, estrogen receptor positive (HCC)   11/13/2018 Surgery   Left nipple sparing mastectom y with left axillary sentinel lymph node biopsy: invasive ductal carcinoma with intermediate DCIS. Margins <25mm anterior and posterior, LVSI (+). 1/7 lymph nodes invovled with disease. Grade 2. ER/PR (+) and Her 2(-)   06/06/2019 - 07/23/2019 Radiation   EBRT to Left Breast 50.4 Gy in 28 Fractions    06/2019 -  Hormone Therapy   Anastrozole     Metastatic non-small cell lung cancer    (CMD)  04/18/2024 Imaging   CXR: Hazy airspace opacity in the right most prominent in RUL above the minor fissure.   CT CHEST W: Mass vs centrally necrotic lymph node in the right hilum measuring 2.7cm. Pathologically enlarged mediastinal lymph nodes which are centrally cystic. Subcarinal lymph node 2.1 cm. Right hilar lymph node which is centrally necrotic measuring 1.9 cm. Irregular consolidations arising from the hilum in LUL. RUL and RML ground glass nodular thickening reflecting possible neoplastic disease  05/09/2024 Initial Diagnosis   Metastatic non-small cell lung cancer     Imaging   CT Thoracic: multifocal lytic osseous lesions. T4 pathologic fracture and tumor extension into ventral and left aspect  of the spinal canal resulting in effacement of CSF surrounding the cord. Soft tissue tumor associated with destruction of the left T4 pedicle and facet extends into the left T3-T4 and T4-T5 neural foramen. Lytic lesions involving T2, T3 and T5 also noted  CT Cervical: Osseous metastatic disease noted. Multiple small lucent lesions in the vertebrae of cervical spine  CT Lumbar: No evidence of metastatic disease    Imaging   MR THORACIC SPINE: Multifocal upper thoracic most notable at T4 with a pathologic fracture and severe height loss with associated soft tissue lesion that indents the ventral cord and contributes to mild moderate canal stenosis and moderate to severe left T4-T5 foraminal stenosis    05/09/2024 Biopsy   Thoracentesis: Right Pleural Fluid: Non Small Cell Carcinoma    05/29/2024 -  Radiation   RADIATION Treatment Details (Noted on 05/22/2024) Site: Thoracic spine Technique: 3D CRT Goal: Definitive Planned Treatment Start Date: 05/29/2024 3D CRT: Thoracic spine   Treatment Period Technique Fraction Dose Fractions Total Dose  Course 1 05/27/2024-05/27/2024  (days elapsed: 0)        Upper Thoracic Spine 05/27/2024-05/27/2024 3-D 300 / 300 cGy 1 / 10 300 / 3,000 cGy        ROS: A pertinent, complete ROS was completed in clinic and was otherwise negative.  The patient's Allergies, Medications, Medical, Surgical, and Social history were revived and updated in the electronic chart, as recorded below.  Allergies: Patient has no known allergies.   Medications: Current Outpatient Medications  Medication Instructions  . acetaminophen  (TYLENOL ) 500 mg, Every 6 hours PRN  . anastrozole  (ARIMIDEX ) 1 mg, Daily  . atorvastatin  (LIPITOR) 20 mg, Daily  . binimetinib (MEKTOVI) 30 mg, oral, 2 times daily  . Braftovi 225 mg, oral, Every 24 hours  . droNABinol (MARINOL) 2.5 mg, oral, 2 times daily before meals  . HYDROcodone -chlorpheniramine (TUSSIONEX PENNKINETIC) 10-8 mg/5 mL ER  suspension 5 mL, oral, Every 12 hours PRN  . lisinopriL -hydroCHLOROthiazide  (PRINZIDE ) 10-12.5 mg per tablet 1 tablet, Daily  . ondansetron  (ZOFRAN -ODT) 4 mg, oral, Every 8 hours PRN  . polyethylene glycol (GLYCOLAX) 17 g, oral, Daily PRN  . traMADoL (ULTRAM) 50 mg, oral, Daily     Past Medical History: Past Medical History: 03/23/2016: Essential hypertension 03/02/2021: History of therapeutic radiation 09/17/2018: Malignant neoplasm of upper-outer quadrant of left breast  in female, estrogen receptor positive    (CMD)     Comment:  Last Assessment & Plan:  09/12/2018:Palpable left breast              mass at 2:30 position by ultrasound measured 1.6 cm,               calcifications spanning 5.9 cm, 3 abnormal lymph nodes,               multiple masses and calcifications biopsy of the mass               revealed grade 2 IDC plus DCIS with lymphovascular               invasion, ER 100%, PR 60%, Ki-67 30%, HER-2 negative IHC               1+, pathology of calcifications is pending, biopsy of  lymph nodes is dis   Past Surgical History: Past Surgical History: No date: CESAREAN SECTION, UNSPECIFIED     Comment:  Procedure: CESAREAN SECTION 11/2000: INSERTION CENTRAL VENOUS ACCESS DEVICE W/ SUBCUTANEOUS PORT     Comment:  Procedure: INSERTION CENTRAL VENOUS ACCESS DEVICE W/               SUBCUTANEOUS PORT 02/23/2021: OTHER SURGICAL HISTORY     Comment:  Procedure: OTHER SURGICAL HISTORY (removal left chest TE              and placement silicone implant) 11/13/2018: OTHER SURGICAL HISTORY     Comment:  Procedure: OTHER SURGICAL HISTORY (left NSM, SLN, TE/ADM              reconstruction) No date: TUBAL LIGATION     Comment:  Procedure: TUBAL LIGATION   Social History: Social History   Socioeconomic History  . Marital status: Married    Spouse name: Not on file  . Number of children: Not on file  . Years of education: Not on file  . Highest education level: Not on file   Occupational History  . Not on file  Tobacco Use  . Smoking status: Former    Current packs/day: 0.00    Types: Cigarettes    Quit date: 09/16/2018    Years since quitting: 5.7  . Smokeless tobacco: Never  Vaping Use  . Vaping status: Never Used  Substance and Sexual Activity  . Alcohol use: Not Currently  . Drug use: Never  . Sexual activity: Not on file  Other Topics Concern  . Not on file  Social History Narrative  . Not on file   Social Drivers of Health   Food Insecurity: Low Risk  (05/18/2024)   Food vital sign   . Within the past 12 months, you worried that your food would run out before you got money to buy more: Never true   . Within the past 12 months, the food you bought just didn't last and you didn't have money to get more: Never true  Transportation Needs: No Transportation Needs (05/18/2024)   Transportation   . In the past 12 months, has lack of reliable transportation kept you from medical appointments, meetings, work or from getting things needed for daily living? : No  Safety: Low Risk  (05/18/2024)   Safety   . How often does anyone, including family and friends, physically hurt you?: Never   . How often does anyone, including family and friends, insult or talk down to you?: Never   . How often does anyone, including family and friends, threaten you with harm?: Never   . How often does anyone, including family and friends, scream or curse at you?: Never  Living Situation: Low Risk  (05/18/2024)   Living Situation   . What is your living situation today?: I have a steady place to live   . Think about the place you live. Do you have problems with any of the following? Choose all that apply:: None/None on this list    Family History: Cancer-related family history is not on file.    Physical Exam: BP 126/61 (BP Location: Left arm, Patient Position: Sitting)   Pulse 107   Temp 97.2 F (36.2 C) (Temporal)   Resp 16   Ht 1.638 m (5' 4.5)   Wt 39.6 kg  (87 lb 3.2 oz)   SpO2 99%   BMI 14.74 kg/m  Gen: AOx3, NAD, malnourished  HEENT: PERR,  NCAT Chest: non-labored, CTAB, no wheezes/crackles CV: RRR, no m/g/r Abd: soft, NT/ND, no HSM Ext: no deformity, no c/c/e Skin: no rashes/abrasions, no lesions on exposed skin Hem: no bruising, purpura, petechiae Lymph: no obvious cervical, supraclavicular adenopathy Neuro: Grossly intact, no focal deficits  Labs:  Recent Results (from the past 100 hours)  Rad Onc Msq Treatment Summary   Collection Time: 05/27/24 11:40 AM  Result Value Ref Range   Treatment Site Upper Thoracic Spine    Course Number 1    Prescribed Fractional Dose 300 cGray   Prescribed Total Dose 3,000 cGray   Actual Fractions Delivered 1    Prescription Pattern Comment vsim and tx    Actual Session Delivered Dose 300 cGray   Actual Total Dose 300 cGray   Prescribed Technique 3-D    Elapsed Days 0    Start Date 05-27-2024    Last Date 05-27-2024    Prescribed Number of Fractions 10      Pathology: Reviewed.   Radiologic Data: I have personally reviewed the images in [facility] system.  Assessment and Plan:  Heather [name] is a 63 y.o. 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  We reviewed our recommendations for targeted therapy given BRAF V600E mutation. We reviewed common side effects and consent was signed 05/28/24. Will start with dose reduction given poor appetite and malnutrition at this time.   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   No orders of the defined types were placed in this encounter.  Patient seen and discussed with attending physician, Dr. Lysbeth.    Earnie HERO. Plante M.D.  Hematology/Oncology Fellow PGY-5 #: [id number] Electronically signed by:  Earnie Jesus King, MD 05/29/2024 1:00 PM   I saw and evaluated the patient. I reviewed the trainee's note and agree. I performed the service or was physically present during the critical or key portions of the service furnished by the trainee; and I managed the patient.  Elsie Reyes Lysbeth, MD 05/29/2024

## 2024-05-29 NOTE — Radiation Treatment Management (Signed)
 ------------------------------------------------------------------------------- Attestation signed by Ozell Francis Gallus, MD at 05/29/2024 12:09 PM ATTENDING ATTESTATION: Patient is seen today for an on-treatment status check. Interval history, exam, impression and plan per my resident. In addition, I briefly interviewed and examined the patient and reviewed the impression and plan, with which I concur.   -------------------------------------------------------------------------------  Weekly OTV Note: 05/29/2024  Oncology History Overview Note  Diagnosis: Stage IIA pT1pN1acM0 IDC of Left Breast, G2 ER/PR (+) and HER2 (-) (2020) Stage IVB cT4N3M1c  NSCLC of Right Lung  Medical Oncologist: Dr. Lysbeth  Radiation Oncologist: Dr. Gallus    Malignant neoplasm of upper-outer quadrant of left breast in female, estrogen receptor positive (HCC)  09/17/2018 Initial Diagnosis   Malignant neoplasm of upper-outer quadrant of left breast in female, estrogen receptor positive (HCC)   11/13/2018 Surgery   Left nipple sparing mastectom y with left axillary sentinel lymph node biopsy: invasive ductal carcinoma with intermediate DCIS. Margins <23mm anterior and posterior, LVSI (+). 1/7 lymph nodes invovled with disease. Grade 2. ER/PR (+) and Her 2(-)   06/06/2019 - 07/23/2019 Radiation   EBRT to Left Breast 50.4 Gy in 28 Fractions    06/2019 -  Hormone Therapy   Anastrozole     Metastatic non-small cell lung cancer    (CMD)  04/18/2024 Imaging   CXR: Hazy airspace opacity in the right most prominent in RUL above the minor fissure.   CT CHEST W: Mass vs centrally necrotic lymph node in the right hilum measuring 2.7cm. Pathologically enlarged mediastinal lymph nodes which are centrally cystic. Subcarinal lymph node 2.1 cm. Right hilar lymph node which is centrally necrotic measuring 1.9 cm. Irregular consolidations arising from the hilum in LUL. RUL and RML ground glass nodular thickening reflecting possible  neoplastic disease   05/09/2024 Initial Diagnosis   Metastatic non-small cell lung cancer     Imaging   CT Thoracic: multifocal lytic osseous lesions. T4 pathologic fracture and tumor extension into ventral and left aspect of the spinal canal resulting in effacement of CSF surrounding the cord. Soft tissue tumor associated with destruction of the left T4 pedicle and facet extends into the left T3-T4 and T4-T5 neural foramen. Lytic lesions involving T2, T3 and T5 also noted  CT Cervical: Osseous metastatic disease noted. Multiple small lucent lesions in the vertebrae of cervical spine  CT Lumbar: No evidence of metastatic disease    Imaging   MR THORACIC SPINE: Multifocal upper thoracic most notable at T4 with a pathologic fracture and severe height loss with associated soft tissue lesion that indents the ventral cord and contributes to mild moderate canal stenosis and moderate to severe left T4-T5 foraminal stenosis    05/09/2024 Biopsy   Thoracentesis: Right Pleural Fluid: Non Small Cell Carcinoma    05/29/2024 -  Radiation   RADIATION Treatment Details (Noted on 05/22/2024) Site: Thoracic spine Technique: 3D CRT Goal: Definitive Planned Treatment Start Date: 05/29/2024 3D CRT: Thoracic spine   Treatment Period Technique Fraction Dose Fractions Total Dose  Course 1 05/27/2024-05/28/2024  (days elapsed: 1)        Upper Thoracic Spine 05/27/2024-05/28/2024 3-D 300 / 300 cGy 2 / 10 600 / 3,000 cGy     3D CRT: Thoracic spine   Treatment Period Technique Fraction Dose Fractions Total Dose  Course 1 05/27/2024-05/28/2024  (days elapsed: 1)        Upper Thoracic Spine 05/27/2024-05/28/2024 3-D 300 / 300 cGy 2 / 10 600 / 3,000 cGy    Subjective: Heather [name] is seen today  for a regularly scheduled on-treatment visit. She is having pain at night despite using tylenol . She denies new/worsening weakness, changes in bowel and bladder habits, changes in sensation. She will be starting treatment with medical  oncology in near future.   PE: Well developed, well nourished, in no acute distress.  Wt Readings from Last 3 Encounters:  05/28/24 39.6 kg (87 lb 3.2 oz)  05/19/24 39.5 kg (87 lb)  03/20/24 47.6 kg (105 lb)   ECOG: 1   Impressions: 1.  Heather [name] is tolerating treatment well.  Plan: 1.  Continue radiation as prescribed.

## 2024-06-04 ENCOUNTER — Ambulatory Visit

## 2024-06-10 LAB — FUNGUS CULTURE WITH STAIN

## 2024-06-10 LAB — FUNGAL ORGANISM REFLEX

## 2024-06-10 LAB — FUNGUS CULTURE RESULT

## 2024-06-24 LAB — ACID FAST CULTURE WITH REFLEXED SENSITIVITIES (MYCOBACTERIA): Acid Fast Culture: NEGATIVE

## 2024-09-30 ENCOUNTER — Encounter: Payer: Self-pay | Admitting: Medical-Surgical

## 2024-09-30 ENCOUNTER — Ambulatory Visit: Admitting: Medical-Surgical

## 2024-09-30 VITALS — BP 117/78 | HR 95 | Resp 20 | Ht 66.0 in | Wt 114.0 lb

## 2024-09-30 DIAGNOSIS — R7309 Other abnormal glucose: Secondary | ICD-10-CM

## 2024-09-30 DIAGNOSIS — J910 Malignant pleural effusion: Secondary | ICD-10-CM | POA: Diagnosis not present

## 2024-09-30 DIAGNOSIS — I10 Essential (primary) hypertension: Secondary | ICD-10-CM | POA: Diagnosis not present

## 2024-09-30 NOTE — Progress Notes (Signed)
" ° °       Established patient visit  History of Present Illness   Discussed the use of AI scribe software for clinical note transcription with the patient, who gave verbal consent to proceed.  History of Present Illness   Heather [name] is a 63 year old female who presents for assistance with fluid drainage.  Pleurx catheter/malignant pleural effusion - Indwelling right Pleurx catheter requires drainage every 2 to 3 days - Difficulty reaching the site for dressing changes - Requires assistance with dressing changes - Has appropriate catheter supplies - Requesting assistance during this appointment to drain, last time 2 days ago  Lower extremity edema - Ankle and leg swelling present - Wears compression socks for edema management  Dyspnea - Intermittent shortness of breath and activity intolerance - No associated chest pain, dizziness, or headaches - Rarely coughs up sputum - No recent change in respiratory symptoms   Antihypertensive medication and diuresis - Antihypertensive regimen changed from lisinopril  with hydrochlorothiazide  to furosemide - Furosemide causes frequent urination - Does not check blood pressure at home - Occasionally adds salt to food  Physical Exam   Physical Exam Vitals reviewed.  Constitutional:      General: She is not in acute distress.    Appearance: Normal appearance. She is underweight. She is not ill-appearing.  HENT:     Head: Normocephalic and atraumatic.  Cardiovascular:     Rate and Rhythm: Normal rate and regular rhythm.     Pulses: Normal pulses.     Heart sounds: Normal heart sounds. No murmur heard.    No friction rub. No gallop.  Pulmonary:     Effort: Pulmonary effort is normal. No respiratory distress.     Breath sounds: Normal breath sounds. No wheezing.  Skin:    General: Skin is warm and dry.  Neurological:     Mental Status: She is alert and oriented to person, place, and time.  Psychiatric:        Mood and  Affect: Mood normal.        Behavior: Behavior normal.        Thought Content: Thought content normal.        Judgment: Judgment normal.    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 Reports shortness of breath and leg swelling. Dressing adherence challenging. Oxygen levels initially 89% on RA, expected to improve post-drainage. - Assisted with fluid drainage from pleural catheter- 1L of serosanguineous fluid returned. - Site care and dressing change performed. - Rechecked oxygen levels post-drainage- 94-95% RA once hand warmed. - Continue furosemide for fluid management. - Continue use of compression socks. - Ensure proper dressing changes every two to three days.  Essential hypertension Blood pressure well-controlled with current medication. BP 126/82 on arrival. Reports shortness of breath and leg swelling. - Recheck of BP at 117/78, at goal. - Continue furosemide as prescribed. - Encouraged home blood pressure monitoring.     Follow up   Return in about 6 months (around 03/31/2025) for HTN follow up. __________________________________ Zada FREDRIK Palin, DNP, APRN, FNP-BC Primary Care and Sports Medicine [facility] [place] "

## 2025-01-16 ENCOUNTER — Inpatient Hospital Stay: Admitting: Hematology and Oncology

## 2025-03-31 ENCOUNTER — Ambulatory Visit: Admitting: Medical-Surgical
